# Patient Record
Sex: Male | Born: 1935 | Race: White | Hispanic: No | Marital: Married | Smoking: Former smoker
Health system: Southern US, Community
[De-identification: ages and names within clinical notes are randomized; demographics above are authoritative.]

## PROBLEM LIST (undated history)

## (undated) DIAGNOSIS — I519 Heart disease, unspecified: Secondary | ICD-10-CM

## (undated) DIAGNOSIS — C61 Malignant neoplasm of prostate: Secondary | ICD-10-CM

## (undated) DIAGNOSIS — E785 Hyperlipidemia, unspecified: Secondary | ICD-10-CM

## (undated) DIAGNOSIS — I10 Essential (primary) hypertension: Secondary | ICD-10-CM

## (undated) DIAGNOSIS — C801 Malignant (primary) neoplasm, unspecified: Secondary | ICD-10-CM

## (undated) HISTORY — PX: OTHER SURGICAL HISTORY: SHX169

## (undated) HISTORY — DX: Malignant neoplasm of prostate: C61

## (undated) HISTORY — DX: Malignant (primary) neoplasm, unspecified: C80.1

## (undated) HISTORY — DX: Heart disease, unspecified: I51.9

---
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
## 2005-01-07 HISTORY — PX: COLONOSCOPY: SHX174

## 2005-06-17 ENCOUNTER — Ambulatory Visit: Payer: Self-pay | Admitting: General Surgery

## 2008-02-15 ENCOUNTER — Emergency Department: Payer: Self-pay | Admitting: Emergency Medicine

## 2008-02-25 ENCOUNTER — Ambulatory Visit: Payer: Self-pay | Admitting: Family Medicine

## 2011-10-22 DIAGNOSIS — I2510 Atherosclerotic heart disease of native coronary artery without angina pectoris: Secondary | ICD-10-CM | POA: Insufficient documentation

## 2011-11-07 DIAGNOSIS — N529 Male erectile dysfunction, unspecified: Secondary | ICD-10-CM | POA: Insufficient documentation

## 2011-11-29 ENCOUNTER — Ambulatory Visit: Payer: Self-pay | Admitting: Otolaryngology

## 2012-01-08 DIAGNOSIS — C61 Malignant neoplasm of prostate: Secondary | ICD-10-CM

## 2012-01-08 HISTORY — DX: Malignant neoplasm of prostate: C61

## 2012-07-24 DIAGNOSIS — J309 Allergic rhinitis, unspecified: Secondary | ICD-10-CM | POA: Insufficient documentation

## 2012-11-16 ENCOUNTER — Emergency Department: Payer: Self-pay | Admitting: Emergency Medicine

## 2012-12-08 ENCOUNTER — Ambulatory Visit: Payer: Self-pay | Admitting: General Surgery

## 2012-12-22 ENCOUNTER — Ambulatory Visit: Payer: Self-pay | Admitting: General Surgery

## 2013-01-18 ENCOUNTER — Ambulatory Visit (INDEPENDENT_AMBULATORY_CARE_PROVIDER_SITE_OTHER): Payer: Medicare Other | Admitting: General Surgery

## 2013-01-18 ENCOUNTER — Encounter: Payer: Self-pay | Admitting: General Surgery

## 2013-01-18 VITALS — BP 122/64 | HR 77 | Resp 14 | Ht 67.0 in | Wt 153.0 lb

## 2013-01-18 DIAGNOSIS — Z86010 Personal history of colonic polyps: Secondary | ICD-10-CM

## 2013-01-18 DIAGNOSIS — R194 Change in bowel habit: Secondary | ICD-10-CM

## 2013-01-18 DIAGNOSIS — R198 Other specified symptoms and signs involving the digestive system and abdomen: Secondary | ICD-10-CM

## 2013-01-18 DIAGNOSIS — Z8719 Personal history of other diseases of the digestive system: Secondary | ICD-10-CM

## 2013-01-18 MED ORDER — POLYETHYLENE GLYCOL 3350 17 GM/SCOOP PO POWD: ORAL | Status: DC

## 2013-01-18 NOTE — Progress Notes (Signed)
Patient ID: Marcus Blevins, male   DOB: 01/23/1927, 80 y.o.   MRN: [id number]  Chief Complaint  Patient presents with  . Other    colonoscopy    HPI Marcus Blevins is a 80 y.o. male here for colonoscopy discussion. Patient has a history of prostate cancer, that was treated with radiation. He had colonoscopy in 2007 and a small rectal polyp was removed.  In last few mos he has noted some change in bowel habits-increase in frequency.  HPI  Past Medical History  Diagnosis Date  . Cancer     prostate  . Heart disease   . Prostate cancer 2014    8 weeks of radiation, [facility]    Past Surgical History  Procedure Laterality Date  . Colonoscopy  2007  . Cholecystectomy  1958    History reviewed. No pertinent family history.  Social History History  Substance Use Topics  . Smoking status: Former Smoker -- 66 years    Quit date: 01/07/1954  . Smokeless tobacco: Never Used  . Alcohol Use: No    No Known Allergies  Current Outpatient Prescriptions  Medication Sig Dispense Refill  . acetaminophen (TYLENOL) 500 MG tablet Take 500 mg by mouth every 6 (six) hours as needed.      Marland Kitchen amLODipine (NORVASC) 5 MG tablet Take 5 mg by mouth daily.      Marland Kitchen aspirin 325 MG tablet Take 325 mg by mouth daily.      Marland Kitchen atorvastatin (LIPITOR) 40 MG tablet Take 40 mg by mouth daily.      . cyanocobalamin 1000 MCG tablet Take 100 mcg by mouth daily.      . folic acid (FOLVITE) 676 MCG tablet Take 400 mcg by mouth daily.      Marland Kitchen guaiFENesin (MUCINEX) 600 MG 12 hr tablet Take 600 mg by mouth 2 (two) times daily as needed.      Marland Kitchen LORazepam (ATIVAN) 0.5 MG tablet Take 0.5 mg by mouth at bedtime.      . Melatonin 5 MG TABS Take 1 tablet by mouth daily.      . pseudoephedrine (SUDAFED) 30 MG tablet Take 30 mg by mouth every 4 (four) hours as needed for congestion.      Marland Kitchen pyridOXINE (VITAMIN B-6) 100 MG tablet Take 100 mg by mouth daily.      . ramipril (ALTACE) 10 MG capsule Take 10 mg by mouth daily.      .  tamsulosin (FLOMAX) 0.4 MG CAPS capsule Take 0.4 mg by mouth daily.      . traZODone (DESYREL) 100 MG tablet Take 100 mg by mouth at bedtime as needed for sleep.      . polyethylene glycol powder (GLYCOLAX/MIRALAX) powder 255 grams one bottle for colonoscopy prep  255 g  0   No current facility-administered medications for this visit.    Review of Systems Review of Systems  Constitutional: Negative.   HENT: Negative.   Respiratory: Negative.   Gastrointestinal: Positive for constipation. Negative for nausea, vomiting, abdominal pain, diarrhea, blood in stool, abdominal distention, anal bleeding and rectal pain.    Blood pressure 122/64, pulse 77, resp. rate 14, height 5\' 7"  (1.702 m), weight 153 lb (69.4 kg).  Physical Exam Physical Exam  Constitutional: He is oriented to person, place, and time. He appears well-developed and well-nourished.  Eyes: Conjunctivae are normal. No scleral icterus.  Neck: Neck supple. No thyromegaly present.  Cardiovascular: Normal rate, regular rhythm and normal heart sounds.  Pulmonary/Chest: Effort normal and breath sounds normal.  Abdominal: Soft. Normal appearance and bowel sounds are normal. There is no tenderness. No hernia.  Lymphadenopathy:    He has no cervical adenopathy.  Neurological: He is alert and oriented to person, place, and time.    Data Reviewed     Assessment    He is 8 yrs post last colonoscopy. Recent change in bowel habits, past history of a rectal polyp.     Plan    Feel it is reasonable to do colonoscopy at this time. He is agreeable.     Patient has been scheduled for a colonoscopy on 01-27-13 at [facility]. This patient has been asked to decrease current 325 mg aspirin to 81 mg aspirin starting one week prior to procedure.   [name] G 01/19/2013, 9:43 AM

## 2013-01-18 NOTE — Patient Instructions (Addendum)
Colonoscopy A colonoscopy is an exam to look at the entire large intestine (colon). This exam can help find problems such as tumors, polyps, inflammation, and areas of bleeding. The exam takes about 1 hour.  LET [facility] CARE PROVIDER KNOW ABOUT:   Any allergies you have.  All medicines you are taking, including vitamins, herbs, eye drops, creams, and over-the-counter medicines.  Previous problems you or members of your family have had with the use of anesthetics.  Any blood disorders you have.  Previous surgeries you have had.  Medical conditions you have. RISKS AND COMPLICATIONS  Generally, this is a safe procedure. However, as with any procedure, complications can occur. Possible complications include:  Bleeding.  Tearing or rupture of the colon wall.  Reaction to medicines given during the exam.  Infection (rare). BEFORE THE PROCEDURE   Ask your health care provider about changing or stopping your regular medicines.  You may be prescribed an oral bowel prep. This involves drinking a large amount of medicated liquid, starting the day before your procedure. The liquid will cause you to have multiple loose stools until your stool is almost clear or light green. This cleans out your colon in preparation for the procedure.  Do not eat or drink anything else once you have started the bowel prep, unless your health care provider tells you it is safe to do so.  Arrange for someone to drive you home after the procedure. PROCEDURE   You will be given medicine to help you relax (sedative).  You will lie on your side with your knees bent.  A long, flexible tube with a light and camera on the end (colonoscope) will be inserted through the rectum and into the colon. The camera sends video back to a computer screen as it moves through the colon. The colonoscope also releases carbon dioxide gas to inflate the colon. This helps your health care provider see the area better.  During  the exam, your health care provider may take a small tissue sample (biopsy) to be examined under a microscope if any abnormalities are found.  The exam is finished when the entire colon has been viewed. AFTER THE PROCEDURE   Do not drive for 24 hours after the exam.  You may have a small amount of blood in your stool.  You may pass moderate amounts of gas and have mild abdominal cramping or bloating. This is caused by the gas used to inflate your colon during the exam.  Ask when your test results will be ready and how you will get your results. Make sure you get your test results. Document Released: 12/22/1999 Document Revised: 10/14/2012 Document Reviewed: 08/31/2012 [facility] Patient Information 2014 [place].  Patient has been scheduled for a colonoscopy on 01-27-13 at [facility]. This patient has been asked to decrease current 325 mg aspirin to 81 mg aspirin starting one week prior to procedure.

## 2013-01-19 ENCOUNTER — Encounter: Payer: Self-pay | Admitting: General Surgery

## 2013-01-25 ENCOUNTER — Telehealth: Payer: Self-pay | Admitting: *Deleted

## 2013-01-25 NOTE — Telephone Encounter (Signed)
Patient called wanting to change colonoscopy date from 01-27-13 to 02-03-13 at [facility]. This patient states he has to leave this morning to go out of town and is requesting to have completed next week instead. He was reminded to decrease 325 mg aspirin to 81 mg aspirin starting one week prior to procedure. Patient instructed to call should he have further questions.   Trish in Endoscopy notified of date change.

## 2013-02-03 ENCOUNTER — Ambulatory Visit: Payer: Self-pay | Admitting: General Surgery

## 2013-02-03 DIAGNOSIS — R198 Other specified symptoms and signs involving the digestive system and abdomen: Secondary | ICD-10-CM

## 2013-02-03 DIAGNOSIS — K5730 Diverticulosis of large intestine without perforation or abscess without bleeding: Secondary | ICD-10-CM

## 2013-02-03 LAB — HM COLONOSCOPY

## 2013-02-04 ENCOUNTER — Encounter: Payer: Self-pay | Admitting: General Surgery

## 2014-02-22 LAB — HEMOGLOBIN A1C: Hgb A1c MFr Bld: 6.3 % — AB (ref 4.0–6.0)

## 2014-03-15 HISTORY — PX: OTHER SURGICAL HISTORY: SHX169

## 2014-03-28 ENCOUNTER — Encounter: Payer: Self-pay | Admitting: General Surgery

## 2014-03-28 ENCOUNTER — Ambulatory Visit (INDEPENDENT_AMBULATORY_CARE_PROVIDER_SITE_OTHER): Payer: Medicare Other | Admitting: General Surgery

## 2014-03-28 VITALS — BP 110/62 | HR 78 | Resp 14 | Ht 67.0 in

## 2014-03-28 DIAGNOSIS — K921 Melena: Secondary | ICD-10-CM | POA: Diagnosis not present

## 2014-03-28 NOTE — Progress Notes (Signed)
Patient ID: Marcus Blevins, male   DOB: 1927-10-04, 81 y.o.   MRN: [id number]  Chief Complaint  Patient presents with  . Other    eval blood in stool    HPI Marcus Blevins is a 81 y.o. male here today for a evaluation of blood in stool. He first noticed this today. He had a bowel movement and had some blood on the tissue paper. Later on he had an urge to have a bowel movement and had some incontinence. About 30 minutes after that he had some watery stool with blood, and then had another small movement with some blood in it. He describes this as a pink red in color. His most recent colonoscopy was done on 02/03/13 with diverticula in the sigmoid colon found.  HPI  Past Medical History  Diagnosis Date  . Cancer     prostate  . Heart disease   . Prostate cancer 2014    8 weeks of radiation, [facility]    Past Surgical History  Procedure Laterality Date  . Colonoscopy  2007  . Cholecystectomy  1958  . Bladder scraping  03-15-14    [facility], for bladder cancer    History reviewed. No pertinent family history.  Social History History  Substance Use Topics  . Smoking status: Former Smoker -- 96 years    Quit date: 01/07/1954  . Smokeless tobacco: Never Used  . Alcohol Use: No    No Known Allergies  Current Outpatient Prescriptions  Medication Sig Dispense Refill  . acetaminophen (TYLENOL) 500 MG tablet Take 500 mg by mouth every 6 (six) hours as needed.    Marland Kitchen amLODipine (NORVASC) 5 MG tablet Take 5 mg by mouth daily.    Marland Kitchen aspirin 81 MG tablet Take 81 mg by mouth daily.    Marland Kitchen atorvastatin (LIPITOR) 40 MG tablet Take 40 mg by mouth daily.    . cyanocobalamin 1000 MCG tablet Take 100 mcg by mouth daily.    . folic acid (FOLVITE) 631 MCG tablet Take 400 mcg by mouth daily.    Marland Kitchen guaiFENesin (MUCINEX) 600 MG 12 hr tablet Take 600 mg by mouth 2 (two) times daily as needed.    . Melatonin 5 MG TABS Take 1 tablet by mouth daily.    Marland Kitchen pyridOXINE (VITAMIN B-6) 100 MG tablet Take 100 mg by mouth  daily.    . tamsulosin (FLOMAX) 0.4 MG CAPS capsule Take 0.4 mg by mouth daily.     No current facility-administered medications for this visit.    Review of Systems Review of Systems  Constitutional: Negative.   Respiratory: Negative.   Cardiovascular: Negative.   Gastrointestinal: Positive for constipation and blood in stool. Negative for nausea, vomiting, abdominal pain, diarrhea, abdominal distention, anal bleeding and rectal pain.    Blood pressure 110/62, pulse 78, resp. rate 14, height 5\' 7"  (1.702 m).  Physical Exam Physical Exam  Constitutional: He is oriented to person, place, and time. He appears well-developed and well-nourished.  Abdominal: Soft. Normal appearance. There is no tenderness.  Genitourinary: Rectal exam shows no external hemorrhoid.  No blood or stool in the rectum. No visible or palpable findings in the anorectal area.  Neurological: He is alert and oriented to person, place, and time.  Skin: Skin is warm and dry.    Data Reviewed Prior notes  Assessment    Posible mild bleed from diverticulosis. Regardless he is not having any more bleeding.     Plan   Advised on above.  If bleeding recurs to call. Patient to return as needed.       Marcus Blevins 03/30/2014, 9:39 AM

## 2014-03-28 NOTE — Patient Instructions (Signed)
Low-Fiber Diet °Fiber is found in fruits, vegetables, and whole grains. A low-fiber diet restricts fibrous foods that are not digested in the small intestine. A diet containing about 10-15 grams of fiber per day is considered low fiber. Low-fiber diets may be used to: °· Promote healing and rest the bowel during intestinal flare-ups. °· Prevent blockage of a partially obstructed or narrowed gastrointestinal tract. °· Reduce fecal weight and volume. °· Slow the movement of feces. °You may be on a low-fiber diet as a transitional diet following surgery, after an injury (trauma), or because of a short (acute) or lifelong (chronic) illness. Your health care provider will determine the length of time you need to stay on this diet.  °WHAT DO I NEED TO KNOW ABOUT A LOW-FIBER DIET? °Always check the fiber content on the packaging's Nutrition Facts label, especially on foods from the grains list. Ask your dietitian if you have questions about specific foods that are related to your condition, especially if the food is not listed below. In general, a low-fiber food will have less than 2 g of fiber. °WHAT FOODS CAN I EAT? °Grains °All breads and crackers made with white flour. Sweet rolls, doughnuts, waffles, pancakes, French toast, bagels. Pretzels, Melba toast, zwieback. Well-cooked cereals, such as cornmeal, farina, or cream cereals. Dry cereals that do not contain whole grains, fruit, or nuts, such as refined corn, wheat, rice, and oat cereals. Potatoes prepared any way without skins, plain pastas and noodles, refined white rice. Use white flour for baking and making sauces. Use allowed list of grains for casseroles, dumplings, and puddings.  °Vegetables °Strained tomato and vegetable juices. Fresh lettuce, cucumber, spinach. Well-cooked (no skin or pulp) or canned vegetables, such as asparagus, bean sprouts, beets, carrots, green beans, mushrooms, potatoes, pumpkin, spinach, yellow squash, tomato sauce/puree, turnips,  yams, and zucchini. Keep servings limited to ½ cup.  °Fruits °All fruit juices except prune juice. Cooked or canned fruits without skin and seeds, such as applesauce, apricots, cherries, fruit cocktail, grapefruit, grapes, mandarin oranges, melons, peaches, pears, pineapple, and plums. Fresh fruits without skin, such as apricots, avocados, bananas, melons, pineapple, nectarines, and peaches. Keep servings limited to ½ cup or 1 piece.   °Meat and Other Protein Sources °Ground or well-cooked tender beef, ham, veal, lamb, pork, or poultry. Eggs, plain cheese. Fish, oysters, shrimp, lobster, and other seafood. Liver, organ meats. Smooth nut butters. °Dairy °All milk products and alternative dairy substitutes, such as soy, rice, almond, and coconut, not containing added whole nuts, seeds, or added fruit. °Beverages °Decaf coffee, fruit, and vegetable juices or smoothies (small amounts, with no pulp or skins, and with fruits from allowed list), sports drinks, herbal tea. °Condiments °Ketchup, mustard, vinegar, cream sauce, cheese sauce, cocoa powder. Spices in moderation, such as allspice, basil, bay leaves, celery powder or leaves, cinnamon, cumin powder, curry powder, ginger, mace, marjoram, onion or garlic powder, oregano, paprika, parsley flakes, ground pepper, rosemary, sage, savory, tarragon, thyme, and turmeric. °Sweets and Desserts °Plain cakes and cookies, pie made with allowed fruit, pudding, custard, cream pie. Gelatin, fruit, ice, sherbet, frozen ice pops. Ice cream, ice milk without nuts. Plain hard candy, honey, jelly, molasses, syrup, sugar, chocolate syrup, gumdrops, marshmallows. Limit overall sugar intake.  °Fats and Oil °Margarine, butter, cream, mayonnaise, salad oils, plain salad dressings made from allowed foods. Choose healthy fats such as olive oil, canola oil, and omega-3 fatty acids (such as found in salmon or tuna) when possible.  °Other °Bouillon, broth, or cream soups made   from allowed foods.  Any strained soup. Casseroles or mixed dishes made with allowed foods. °The items listed above may not be a complete list of recommended foods or beverages. Contact your dietitian for more options.  °WHAT FOODS ARE NOT RECOMMENDED? °Grains °All whole wheat and whole grain breads and crackers. Multigrains, rye, bran seeds, nuts, or coconut. Cereals containing whole grains, multigrains, bran, coconut, nuts, raisins. Cooked or dry oatmeal, steel-cut oats. Coarse wheat cereals, granola. Cereals advertised as high fiber. Potato skins. Whole grain pasta, wild or brown rice. Popcorn. Coconut flour. Bran, buckwheat, corn bread, multigrains, rye, wheat germ.  °Vegetables °Fresh, cooked or canned vegetables, such as artichokes, asparagus, beet greens, broccoli, Brussels sprouts, cabbage, celery, cauliflower, corn, eggplant, kale, legumes or beans, okra, peas, and tomatoes. Avoid large servings of any vegetables, especially raw vegetables.  °Fruits °Fresh fruits, such as apples with or without skin, berries, cherries, figs, grapes, grapefruit, guavas, kiwis, mangoes, oranges, papayas, pears, persimmons, pineapple, and pomegranate. Prune juice and juices with pulp, stewed or dried prunes. Dried fruits, dates, raisins. Fruit seeds or skins. Avoid large servings of all fresh fruits. °Meats and Other Protein Sources °Tough, fibrous meats with gristle. Chunky nut butter. Cheese made with seeds, nuts, or other foods not recommended. Nuts, seeds, legumes (beans, including baked beans), dried peas, beans, lentils.  °Dairy °Yogurt or cheese that contains nuts, seeds, or added fruit.  °Beverages °Fruit juices with high pulp, prune juice. Caffeinated coffee and teas.  °Condiments °Coconut, maple syrup, pickles, olives. °Sweets and Desserts °Desserts, cookies, or candies that contain nuts or coconut, chunky peanut butter, dried fruits. Jams, preserves with seeds, marmalade. Large amounts of sugar and sweets. Any other dessert made with  fruits from the not recommended list.  °Other °Soups made from vegetables that are not recommended or that contain other foods not recommended.  °The items listed above may not be a complete list of foods and beverages to avoid. Contact your dietitian for more information. °Document Released: 06/15/2001 Document Revised: 12/29/2012 Document Reviewed: 11/16/2012 °[employer]® Patient Information ©2015 [employer]. This information is not intended to replace advice given to you by your health care provider. Make sure you discuss any questions you have with your health care provider. ° °

## 2014-03-30 ENCOUNTER — Encounter: Payer: Self-pay | Admitting: General Surgery

## 2014-04-01 DIAGNOSIS — C673 Malignant neoplasm of anterior wall of bladder: Secondary | ICD-10-CM | POA: Insufficient documentation

## 2014-04-21 DIAGNOSIS — C7951 Secondary malignant neoplasm of bone: Secondary | ICD-10-CM | POA: Insufficient documentation

## 2014-06-11 ENCOUNTER — Other Ambulatory Visit: Payer: Self-pay | Admitting: Family Medicine

## 2014-07-06 ENCOUNTER — Observation Stay
Admission: EM | Admit: 2014-07-06 | Discharge: 2014-07-08 | Disposition: A | Discharge status: Home / Self Care | Payer: Medicare Other | Attending: Internal Medicine | Admitting: Internal Medicine

## 2014-07-06 ENCOUNTER — Encounter: Payer: Self-pay | Admitting: Emergency Medicine

## 2014-07-06 ENCOUNTER — Emergency Department: Payer: Medicare Other

## 2014-07-06 DIAGNOSIS — Z79899 Other long term (current) drug therapy: Secondary | ICD-10-CM | POA: Insufficient documentation

## 2014-07-06 DIAGNOSIS — K121 Other forms of stomatitis: Secondary | ICD-10-CM | POA: Insufficient documentation

## 2014-07-06 DIAGNOSIS — C61 Malignant neoplasm of prostate: Secondary | ICD-10-CM | POA: Diagnosis not present

## 2014-07-06 DIAGNOSIS — B9620 Unspecified Escherichia coli [E. coli] as the cause of diseases classified elsewhere: Secondary | ICD-10-CM | POA: Diagnosis not present

## 2014-07-06 DIAGNOSIS — Z823 Family history of stroke: Secondary | ICD-10-CM | POA: Diagnosis not present

## 2014-07-06 DIAGNOSIS — Z87891 Personal history of nicotine dependence: Secondary | ICD-10-CM | POA: Insufficient documentation

## 2014-07-06 DIAGNOSIS — Z7982 Long term (current) use of aspirin: Secondary | ICD-10-CM | POA: Diagnosis not present

## 2014-07-06 DIAGNOSIS — I10 Essential (primary) hypertension: Secondary | ICD-10-CM | POA: Diagnosis not present

## 2014-07-06 DIAGNOSIS — Z8551 Personal history of malignant neoplasm of bladder: Secondary | ICD-10-CM | POA: Diagnosis not present

## 2014-07-06 DIAGNOSIS — R5081 Fever presenting with conditions classified elsewhere: Secondary | ICD-10-CM | POA: Diagnosis not present

## 2014-07-06 DIAGNOSIS — R05 Cough: Secondary | ICD-10-CM | POA: Diagnosis not present

## 2014-07-06 DIAGNOSIS — N390 Urinary tract infection, site not specified: Secondary | ICD-10-CM | POA: Diagnosis not present

## 2014-07-06 DIAGNOSIS — Z9049 Acquired absence of other specified parts of digestive tract: Secondary | ICD-10-CM | POA: Insufficient documentation

## 2014-07-06 DIAGNOSIS — Z1639 Resistance to other specified antimicrobial drug: Secondary | ICD-10-CM | POA: Diagnosis not present

## 2014-07-06 DIAGNOSIS — E785 Hyperlipidemia, unspecified: Secondary | ICD-10-CM | POA: Insufficient documentation

## 2014-07-06 DIAGNOSIS — Z9221 Personal history of antineoplastic chemotherapy: Secondary | ICD-10-CM | POA: Insufficient documentation

## 2014-07-06 DIAGNOSIS — D709 Neutropenia, unspecified: Secondary | ICD-10-CM | POA: Diagnosis present

## 2014-07-06 DIAGNOSIS — Z8249 Family history of ischemic heart disease and other diseases of the circulatory system: Secondary | ICD-10-CM | POA: Diagnosis not present

## 2014-07-06 DIAGNOSIS — R32 Unspecified urinary incontinence: Secondary | ICD-10-CM | POA: Insufficient documentation

## 2014-07-06 DIAGNOSIS — Z1611 Resistance to penicillins: Secondary | ICD-10-CM | POA: Insufficient documentation

## 2014-07-06 HISTORY — DX: Hyperlipidemia, unspecified: E78.5

## 2014-07-06 HISTORY — DX: Essential (primary) hypertension: I10

## 2014-07-06 LAB — CBC WITH DIFFERENTIAL/PLATELET
Band Neutrophils: 0 % (ref 0–10)
Basophils Absolute: 0 10*3/uL (ref 0.0–0.1)
Basophils Relative: 0 % (ref 0–1)
Blasts: 0 %
Eosinophils Absolute: 0 10*3/uL (ref 0.0–0.7)
Eosinophils Relative: 0 % (ref 0–5)
HCT: 32.2 % — ABNORMAL LOW (ref 40.0–52.0)
HEMOGLOBIN: 10.6 g/dL — AB (ref 13.0–18.0)
LYMPHS ABS: 0.5 10*3/uL — AB (ref 0.7–4.0)
Lymphocytes Relative: 96 % — ABNORMAL HIGH (ref 12–46)
MCH: 26.4 pg (ref 26.0–34.0)
MCHC: 33.1 g/dL (ref 32.0–36.0)
MCV: 79.7 fL — ABNORMAL LOW (ref 80.0–100.0)
METAMYELOCYTES PCT: 0 %
MONOS PCT: 4 % (ref 3–12)
Monocytes Absolute: 0 10*3/uL — ABNORMAL LOW (ref 0.1–1.0)
Myelocytes: 0 %
NEUTROS PCT: 0 % — AB (ref 43–77)
NRBC: 0 /100{WBCs}
Neutro Abs: 0 10*3/uL — ABNORMAL LOW (ref 1.7–7.7)
OTHER: 0 %
Platelets: 167 10*3/uL (ref 150–440)
Promyelocytes Absolute: 0 %
RBC: 4.03 MIL/uL — ABNORMAL LOW (ref 4.40–5.90)
RDW: 15.7 % — ABNORMAL HIGH (ref 11.5–14.5)
WBC: 0.5 10*3/uL — CL (ref 3.8–10.6)

## 2014-07-06 LAB — COMPREHENSIVE METABOLIC PANEL
ALK PHOS: 97 U/L (ref 38–126)
ALT: 16 U/L — AB (ref 17–63)
ANION GAP: 7 (ref 5–15)
AST: 18 U/L (ref 15–41)
Albumin: 3.3 g/dL — ABNORMAL LOW (ref 3.5–5.0)
BUN: 15 mg/dL (ref 6–20)
CALCIUM: 7.7 mg/dL — AB (ref 8.9–10.3)
CO2: 25 mmol/L (ref 22–32)
Chloride: 103 mmol/L (ref 101–111)
Creatinine, Ser: 0.95 mg/dL (ref 0.61–1.24)
GFR calc Af Amer: >60 mL/min (ref >60)
GFR calc non Af Amer: >60 mL/min (ref >60)
GLUCOSE: 186 mg/dL — AB (ref 65–99)
Potassium: 4.1 mmol/L (ref 3.5–5.1)
Sodium: 135 mmol/L (ref 135–145)
TOTAL PROTEIN: 6.7 g/dL (ref 6.5–8.1)
Total Bilirubin: 1 mg/dL (ref 0.3–1.2)

## 2014-07-06 LAB — TROPONIN I

## 2014-07-06 MED ORDER — DEXTROSE 5 % IV SOLN
2.0000 g | Freq: Three times a day (TID) | INTRAVENOUS | Status: DC
  Administered 2014-07-07 (×4): 2 g via INTRAVENOUS
  Filled 2014-07-06 (×7): qty 2

## 2014-07-06 MED ORDER — BUDESONIDE 0.5 MG/2ML IN SUSP
0.5000 mg | Freq: Two times a day (BID) | RESPIRATORY_TRACT | Status: DC
  Administered 2014-07-07: 0.5 mg via RESPIRATORY_TRACT
  Filled 2014-07-06: qty 2

## 2014-07-06 MED ORDER — VITAMIN B-12 1000 MCG PO TABS
1000.0000 ug | ORAL_TABLET | Freq: Every day | ORAL | Status: DC
  Administered 2014-07-07 – 2014-07-08 (×2): 1000 ug via ORAL
  Filled 2014-07-06 (×3): qty 1

## 2014-07-06 MED ORDER — ATORVASTATIN CALCIUM 20 MG PO TABS
20.0000 mg | ORAL_TABLET | Freq: Every day | ORAL | Status: DC
  Administered 2014-07-07: 20 mg via ORAL
  Filled 2014-07-06 (×2): qty 1

## 2014-07-06 MED ORDER — ONDANSETRON HCL 4 MG PO TABS
4.0000 mg | ORAL_TABLET | Freq: Four times a day (QID) | ORAL | Status: DC | PRN
Start: 2014-07-06 — End: 2014-07-08
  Administered 2014-07-06 – 2014-07-07 (×2): 4 mg via ORAL
  Filled 2014-07-06 (×2): qty 1

## 2014-07-06 MED ORDER — FLUTICASONE PROPIONATE 50 MCG/ACT NA SUSP
1.0000 | Freq: Every day | NASAL | Status: DC
  Administered 2014-07-07 – 2014-07-08 (×3): 1 via NASAL
  Filled 2014-07-06 (×2): qty 16

## 2014-07-06 MED ORDER — VITAMIN B-6 100 MG PO TABS
100.0000 mg | ORAL_TABLET | Freq: Every day | ORAL | Status: DC
  Administered 2014-07-07 – 2014-07-08 (×2): 100 mg via ORAL
  Filled 2014-07-06 (×5): qty 1

## 2014-07-06 MED ORDER — AMLODIPINE BESYLATE 5 MG PO TABS
5.0000 mg | ORAL_TABLET | Freq: Every day | ORAL | Status: DC
  Administered 2014-07-07 – 2014-07-08 (×2): 5 mg via ORAL
  Filled 2014-07-06 (×3): qty 1

## 2014-07-06 MED ORDER — FOLIC ACID 1 MG PO TABS
1000.0000 ug | ORAL_TABLET | Freq: Every day | ORAL | Status: DC
  Administered 2014-07-07 – 2014-07-08 (×2): 1 mg via ORAL
  Filled 2014-07-06 (×4): qty 1

## 2014-07-06 MED ORDER — ACETAMINOPHEN 650 MG RE SUPP: 650.0000 mg | Freq: Four times a day (QID) | RECTAL | Status: DC | PRN

## 2014-07-06 MED ORDER — VANCOMYCIN HCL IN DEXTROSE 1-5 GM/200ML-% IV SOLN
1000.0000 mg | Freq: Once | INTRAVENOUS | Status: AC
Start: 2014-07-06 — End: 2014-07-07
  Administered 2014-07-06: 1000 mg via INTRAVENOUS
  Filled 2014-07-06: qty 200

## 2014-07-06 MED ORDER — ONDANSETRON HCL 4 MG/2ML IJ SOLN
4.0000 mg | Freq: Four times a day (QID) | INTRAMUSCULAR | Status: DC | PRN
  Administered 2014-07-07: 11:00:00 4 mg via INTRAVENOUS
  Filled 2014-07-06: qty 2

## 2014-07-06 MED ORDER — PROCHLORPERAZINE MALEATE 10 MG PO TABS: 10.0000 mg | ORAL_TABLET | Freq: Four times a day (QID) | ORAL | Status: DC | PRN

## 2014-07-06 MED ORDER — MONTELUKAST SODIUM 10 MG PO TABS
10.0000 mg | ORAL_TABLET | Freq: Every day | ORAL | Status: DC
  Administered 2014-07-07 – 2014-07-08 (×2): 10 mg via ORAL
  Filled 2014-07-06 (×3): qty 1

## 2014-07-06 MED ORDER — NYSTATIN 100000 UNIT/ML MT SUSP
5.0000 mL | Freq: Four times a day (QID) | OROMUCOSAL | Status: DC
  Administered 2014-07-06 – 2014-07-08 (×5): 500000 [IU] via OROMUCOSAL
  Filled 2014-07-06 (×7): qty 5

## 2014-07-06 MED ORDER — ASPIRIN EC 81 MG PO TBEC
81.0000 mg | DELAYED_RELEASE_TABLET | Freq: Every day | ORAL | Status: DC
  Administered 2014-07-07 – 2014-07-08 (×2): 81 mg via ORAL
  Filled 2014-07-06 (×3): qty 1

## 2014-07-06 MED ORDER — DIPHENHYDRAMINE HCL 25 MG PO CAPS
25.0000 mg | ORAL_CAPSULE | Freq: Every evening | ORAL | Status: DC | PRN
  Administered 2014-07-06 – 2014-07-07 (×2): 25 mg via ORAL
  Filled 2014-07-06 (×2): qty 1

## 2014-07-06 MED ORDER — TAMSULOSIN HCL 0.4 MG PO CAPS
0.4000 mg | ORAL_CAPSULE | Freq: Every day | ORAL | Status: DC
  Administered 2014-07-07 – 2014-07-08 (×2): 0.4 mg via ORAL
  Filled 2014-07-06 (×3): qty 1

## 2014-07-06 MED ORDER — ACETAMINOPHEN 325 MG PO TABS
650.0000 mg | ORAL_TABLET | Freq: Four times a day (QID) | ORAL | Status: DC | PRN
  Administered 2014-07-06 – 2014-07-07 (×2): 650 mg via ORAL
  Filled 2014-07-06 (×2): qty 2

## 2014-07-06 MED ORDER — ENOXAPARIN SODIUM 40 MG/0.4ML ~~LOC~~ SOLN
40.0000 mg | SUBCUTANEOUS | Status: DC
  Administered 2014-07-06 – 2014-07-07 (×2): 40 mg via SUBCUTANEOUS
  Filled 2014-07-06 (×2): qty 0.4

## 2014-07-06 MED ORDER — FILGRASTIM 300 MCG/ML IJ SOLN
300.0000 ug | Freq: Once | INTRAMUSCULAR | Status: AC
  Administered 2014-07-06: 300 ug via SUBCUTANEOUS
  Filled 2014-07-06: qty 1

## 2014-07-06 MED ORDER — DARIFENACIN HYDROBROMIDE ER 7.5 MG PO TB24
7.5000 mg | ORAL_TABLET | Freq: Every day | ORAL | Status: DC
  Administered 2014-07-07 – 2014-07-08 (×2): 7.5 mg via ORAL
  Filled 2014-07-06 (×2): qty 1

## 2014-07-06 MED ORDER — SODIUM CHLORIDE 0.9 % IV SOLN
500.0000 mg | Freq: Once | INTRAVENOUS | Status: AC
  Administered 2014-07-06: 500 mg via INTRAVENOUS
  Filled 2014-07-06: qty 500

## 2014-07-06 MED ORDER — ATENOLOL 50 MG PO TABS
50.0000 mg | ORAL_TABLET | Freq: Every day | ORAL | Status: DC
  Administered 2014-07-07 – 2014-07-08 (×2): 50 mg via ORAL
  Filled 2014-07-06 (×3): qty 1

## 2014-07-06 MED ORDER — CLOTRIMAZOLE 1 % EX CREA
TOPICAL_CREAM | Freq: Two times a day (BID) | CUTANEOUS | Status: DC
  Filled 2014-07-06: qty 15

## 2014-07-06 MED ORDER — LIDOCAINE VISCOUS 2 % MT SOLN
5.0000 mL | OROMUCOSAL | Status: DC | PRN
  Administered 2014-07-06: 22:00:00 5 mL via OROMUCOSAL
  Filled 2014-07-06: qty 15

## 2014-07-06 NOTE — ED Notes (Signed)
Pt here with a fever. Pt recently stated on chemo and is running a fever. Pt was told that he needed to come in for eval.  Pt also states that he has had an increase in generalized pain. Pt was placed on the monitor and pt in NAD at this time. Pts family at bedside

## 2014-07-06 NOTE — ED Provider Notes (Signed)
[facility] Emergency Department Provider Note  ____________________________________________  Time seen: Approximately 4:49 PM  I have reviewed the triage vital signs and the nursing notes.   HISTORY  Chief Complaint Fever    HPI Marcus Blevins is a 81 y.o. male Patient comes in with family who report he has been taking docetaxel DOC ETA XEL for prostate cancer. [facility] is been giving it to him. He got his first dose 1 week ago today. He has been having various aches and pains in his ankles his groin his jaw he has discussed these with his doctor and been told that they are side effects of that medicine. Today patient began running a fever of almost 101. He has no new symptoms he continues to have the various and sundry aches and pains. He has sores in his mouth that are painful. These have been present for several days. He is seeing his dentist about them and received [facility] mouthwash for them. He reports [facility] mouthwash as helped but he still having trouble with it and does not think his upper dentures are fitting exactly right. Patient has a chronic dry cough. Patient has no other new symptoms.   Past Medical History  Diagnosis Date  . Cancer     prostate  . Heart disease   . Prostate cancer 2014    8 weeks of radiation, [facility]  . Hypertension   . Hyperlipidemia     There are no active problems to display for this patient.   Past Surgical History  Procedure Laterality Date  . Colonoscopy  2007  . Cholecystectomy  1958  . Bladder scraping  03-15-14    [facility], for bladder cancer  . Tumor removed from bladder      Current Outpatient Rx  Name  Route  Sig  Dispense  Refill  . amLODipine (NORVASC) 5 MG tablet   Oral   Take 5 mg by mouth daily.         Marland Kitchen aspirin EC 81 MG tablet   Oral   Take 81 mg by mouth daily.         Marland Kitchen atenolol (TENORMIN) 50 MG tablet   Oral   Take 50 mg by mouth daily.         Marland Kitchen atorvastatin (LIPITOR) 20 MG  tablet   Oral   Take 20 mg by mouth at bedtime.         . clotrimazole-betamethasone (LOTRISONE) cream   Topical   Apply 1 application topically 2 (two) times daily.         Marland Kitchen dexamethasone (DECADRON) 4 MG tablet   Oral   Take 8 mg by mouth 2 (two) times daily. Pt only takes the day before and day after  DOCEtaxel.         . diphenhydramine-acetaminophen (TYLENOL PM) 25-500 MG TABS   Oral   Take 1 tablet by mouth at bedtime as needed (for sleep).         . fluticasone (FLONASE) 50 MCG/ACT nasal spray   Each Nare   Place 1 spray into both nostrils daily.         . folic acid (FOLVITE) 250 MCG tablet   Oral   Take 400 mcg by mouth daily.         Marland Kitchen leuprolide, 6 Month, (ELIGARD) 45 MG injection   Subcutaneous   Inject 45 mg into the skin every 6 (six) months.         Marland Kitchen  lidocaine (XYLOCAINE) 2 % solution   Oral   Take 5 mLs by mouth 5 (five) times daily as needed for mouth pain.         . Melatonin 5 MG TABS   Oral   Take 1 tablet by mouth at bedtime as needed (for sleep).          . mometasone (ASMANEX) 220 MCG/INH inhaler   Inhalation   Inhale 1 puff into the lungs 2 (two) times daily.         . montelukast (SINGULAIR) 10 MG tablet   Oral   Take 10 mg by mouth daily.         . prochlorperazine (COMPAZINE) 10 MG tablet   Oral   Take 10 mg by mouth every 6 (six) hours as needed for nausea or vomiting.         . pyridOXINE (VITAMIN B-6) 100 MG tablet   Oral   Take 100 mg by mouth daily.         . solifenacin (VESICARE) 10 MG tablet   Oral   Take 10 mg by mouth at bedtime.         . tamsulosin (FLOMAX) 0.4 MG CAPS capsule   Oral   Take 0.4 mg by mouth daily.         . vitamin B-12 (CYANOCOBALAMIN) 1000 MCG tablet   Oral   Take 1,000 mcg by mouth daily.         . montelukast (SINGULAIR) 10 MG tablet      TAKE 1 TABLET BY MOUTH DAILY AS NEEDED FOR SINUS DRAINAGE Patient not taking: Reported on 07/06/2014   30 tablet   3      Allergies Review of patient's allergies indicates no known allergies.  No family history on file.  Social History History  Substance Use Topics  . Smoking status: Former Smoker -- 5 years    Quit date: 01/07/1954  . Smokeless tobacco: Never Used  . Alcohol Use: No    Review of Systems Constitutional: No fever/chills Eyes: No visual changes. ENT: No sore throat. Cardiovascular: Denies chest pain. Respiratory: Denies shortness of breath. Gastrointestinal: No abdominal pain.  No nausea, no vomiting.  No diarrhea.  No constipation. Genitourinary: Negative for dysuria. Musculoskeletal: Negative for back pain. Skin: Negative for rash. Neurological: Negative for headaches, focal weakness or numbness. Hematological/Lymphatic: Allergic/Immunilogical: **} 10-point ROS otherwise negative.  ____________________________________________   PHYSICAL EXAM:  VITAL SIGNS: ED Triage Vitals  Enc Vitals Group     BP 07/06/14 1555 121/60 mmHg     Pulse Rate 07/06/14 1555 91     Resp 07/06/14 1555 18     Temp 07/06/14 1555 97.8 F (36.6 C)     Temp Source 07/06/14 1555 Oral     SpO2 07/06/14 1555 96 %     Weight 07/06/14 1555 150 lb (68.04 kg)     Height 07/06/14 1555 5\' 7"  (1.702 m)     Head Cir --      Peak Flow --      Pain Score 07/06/14 1559 4     Pain Loc --      Pain Edu? --      Excl. in [place]? --     Constitutional: Alert and oriented. Well appearing and in no acute distress. Eyes: Conjunctivae are normal. PERRL. EOMI. Head: Atraumatic.there is no tenderness in the temples. Ears TMs obscured by wax Nose: No congestion/rhinnorhea. Mouth/Throat: Mucous membranes are moist.  Oropharynx edematous with  some white discharge no obvious ulcers Neck: No stridor.  No adenopathy Cardiovascular: Normal rate, regular rhythm. Grossly normal heart sounds.  Good peripheral circulation. Respiratory: Normal respiratory effort.  No retractions. Lungs CTAB. Gastrointestinal: Soft and  nontender. No distention. No abdominal bruits. No CVA tenderness. Musculoskeletal: No lower extremity tenderness nor edema.  No joint effusions. Neurologic:  Normal speech and language. No gross focal neurologic deficits are appreciated. Speech is normal. No gait instability. Skin:  Skin is warm, dry and intact. No rash noted. Psychiatric: Mood and affect are normal. Speech and behavior are normal.  ____________________________________________   LABS (all labs ordered are listed, but only abnormal results are displayed)  Labs Reviewed  CBC WITH DIFFERENTIAL/PLATELET - Abnormal; Notable for the following:    WBC 0.5 (*)    RBC 4.03 (*)    Hemoglobin 10.6 (*)    HCT 32.2 (*)    MCV 79.7 (*)    RDW 15.7 (*)    Neutrophils Relative % 0 (*)    Lymphocytes Relative 96 (*)    Neutro Abs 0.0 (*)    Lymphs Abs 0.5 (*)    Monocytes Absolute 0.0 (*)    All other components within normal limits  COMPREHENSIVE METABOLIC PANEL - Abnormal; Notable for the following:    Glucose, Bld 186 (*)    Calcium 7.7 (*)    Albumin 3.3 (*)    ALT 16 (*)    All other components within normal limits  CULTURE, BLOOD (ROUTINE X 2)  CULTURE, BLOOD (ROUTINE X 2)  URINE CULTURE  TROPONIN I   ____________________________________________  EKG   ____________________________________________  RADIOLOGY  Chest x-ray no acute changes reviewed by me ____________________________________________   PROCEDURES    ____________________________________________   INITIAL IMPRESSION / ASSESSMENT AND PLAN / ED COURSE  Pertinent labs & imaging results that were available during my care of the patient were reviewed by me and considered in my medical decision making (see chart for details).  Patient discussed discussed with Dr. Ernestene Mention ANG his oncologist at [facility] he recommends cefepime for neutropenic fever we unfortunately do not have that available here apparently is on back order iron treated him with Primaxin Dr.  Liberty Handy he will follow-up with the admission ____________________________________________   FINAL CLINICAL IMPRESSION(S) / ED DIAGNOSES  Final diagnoses:  Neutropenic fever      Nena Polio, MD 07/06/14 [phone]

## 2014-07-06 NOTE — H&P (Signed)
[place] at [place] NAME: Marcus Blevins    MR#:  [id number]  DATE OF BIRTH:  1927/04/16  DATE OF ADMISSION:  07/06/2014  PRIMARY CARE PHYSICIAN: Lelon Huh, MD   REQUESTING/REFERRING PHYSICIAN: Dr. Corinna Capra  CHIEF COMPLAINT:   Chief Complaint  Patient presents with  . Fever   neutropenic fever  HISTORY OF PRESENT ILLNESS:  Marcus Blevins  is a 81 y.o. male with a known history of prostate cancer currently undergoing chemotherapy, hypertension, urinary incontinence, hypertension, hyperlipidemia, came into the hospital due to fever 100.9. Patient was recently started on chemotherapy for his cancer about a week ago and is followed by [facility]. She was noted to have a white cell count of 0.5 and noted to be neutropenic and therefore is being admitted for further evaluation of his neutropenic fever. The ER physician spoke with the patient's oncologist at [facility] and he recommended admission for observation. Patient's only complaint is soreness in his mouth and poor appetite. He denies any shortness of breath, cough, chest pain, and dysuria, hematuria or any other associated symptoms presently.  PAST MEDICAL HISTORY:   Past Medical History  Diagnosis Date  . Cancer     prostate  . Heart disease   . Prostate cancer 2014    8 weeks of radiation, [facility]  . Hypertension   . Hyperlipidemia     PAST SURGICAL HISTORY:   Past Surgical History  Procedure Laterality Date  . Colonoscopy  2007  . Cholecystectomy  1958  . Bladder scraping  03-15-14    [facility], for bladder cancer  . Tumor removed from bladder      SOCIAL HISTORY:   History  Substance Use Topics  . Smoking status: Former Smoker -- 1.00 packs/day for 20 years    Quit date: 01/07/1954  . Smokeless tobacco: Never Used  . Alcohol Use: No    FAMILY HISTORY:   Family History  Problem Relation Age of Onset  . CVA Mother   . Heart attack Father     DRUG  ALLERGIES:  No Known Allergies  REVIEW OF SYSTEMS:   Review of Systems  Constitutional: Positive for fever (100.9). Negative for weight loss.  HENT: Positive for sore throat. Negative for congestion, nosebleeds and tinnitus.        Mouth sores, pain with swallowing.   Eyes: Negative for blurred vision, double vision and redness.  Respiratory: Negative for cough, hemoptysis and shortness of breath.   Cardiovascular: Negative for chest pain, orthopnea, leg swelling and PND.  Gastrointestinal: Negative for nausea, vomiting, abdominal pain, diarrhea and melena.  Genitourinary: Negative for dysuria, urgency and hematuria.  Musculoskeletal: Negative for joint pain and falls.  Skin: Negative for rash.  Neurological: Negative for dizziness, tingling, sensory change, focal weakness, seizures, weakness and headaches.  Endo/Heme/Allergies: Negative for polydipsia. Does not bruise/bleed easily.  Psychiatric/Behavioral: Negative for depression and memory loss. The patient is not nervous/anxious.     MEDICATIONS AT HOME:   Prior to Admission medications   Medication Sig Start Date End Date Taking? Authorizing Provider  amLODipine (NORVASC) 5 MG tablet Take 5 mg by mouth daily.   Yes Historical Provider, MD  aspirin EC 81 MG tablet Take 81 mg by mouth daily.   Yes Historical Provider, MD  atenolol (TENORMIN) 50 MG tablet Take 50 mg by mouth daily.   Yes Historical Provider, MD  atorvastatin (LIPITOR) 20 MG tablet Take 20 mg by mouth at bedtime.  Yes Historical Provider, MD  clotrimazole-betamethasone (LOTRISONE) cream Apply 1 application topically 2 (two) times daily.   Yes Historical Provider, MD  dexamethasone (DECADRON) 4 MG tablet Take 8 mg by mouth 2 (two) times daily. Pt only takes the day before and day after  DOCEtaxel.   Yes Historical Provider, MD  diphenhydramine-acetaminophen (TYLENOL PM) 25-500 MG TABS Take 1 tablet by mouth at bedtime as needed (for sleep).   Yes Historical Provider,  MD  fluticasone (FLONASE) 50 MCG/ACT nasal spray Place 1 spray into both nostrils daily.   Yes Historical Provider, MD  folic acid (FOLVITE) 341 MCG tablet Take 400 mcg by mouth daily.   Yes Historical Provider, MD  leuprolide, 6 Month, (ELIGARD) 45 MG injection Inject 45 mg into the skin every 6 (six) months.   Yes Historical Provider, MD  lidocaine (XYLOCAINE) 2 % solution Take 5 mLs by mouth 5 (five) times daily as needed for mouth pain.   Yes Historical Provider, MD  Melatonin 5 MG TABS Take 1 tablet by mouth at bedtime as needed (for sleep).    Yes Historical Provider, MD  mometasone (ASMANEX) 220 MCG/INH inhaler Inhale 1 puff into the lungs 2 (two) times daily.   Yes Historical Provider, MD  montelukast (SINGULAIR) 10 MG tablet Take 10 mg by mouth daily.   Yes Historical Provider, MD  prochlorperazine (COMPAZINE) 10 MG tablet Take 10 mg by mouth every 6 (six) hours as needed for nausea or vomiting.   Yes Historical Provider, MD  pyridOXINE (VITAMIN B-6) 100 MG tablet Take 100 mg by mouth daily.   Yes Historical Provider, MD  solifenacin (VESICARE) 10 MG tablet Take 10 mg by mouth at bedtime.   Yes Historical Provider, MD  tamsulosin (FLOMAX) 0.4 MG CAPS capsule Take 0.4 mg by mouth daily.   Yes Historical Provider, MD  vitamin B-12 (CYANOCOBALAMIN) 1000 MCG tablet Take 1,000 mcg by mouth daily.   Yes Historical Provider, MD  montelukast (SINGULAIR) 10 MG tablet TAKE 1 TABLET BY MOUTH DAILY AS NEEDED FOR SINUS DRAINAGE Patient not taking: Reported on 07/06/2014 06/11/14   Birdie Sons, MD      VITAL SIGNS:  Blood pressure 129/79, pulse 92, temperature 97.8 F (36.6 C), temperature source Oral, resp. rate 16, height 5\' 7"  (1.702 m), weight 68.04 kg (150 lb), SpO2 95 %.  PHYSICAL EXAMINATION:  Physical Exam  GENERAL:  81 y.o.-year-old patient lying in the bed with no acute distress.  EYES: Pupils equal, round, reactive to light and accommodation. No scleral icterus. Extraocular muscles  intact.  HEENT: Head atraumatic, normocephalic. Oropharynx and nasopharynx clear. No oropharyngeal erythema, dry oral mucosa. Positive oral ulcers. NECK:  Supple, no jugular venous distention. No thyroid enlargement, no tenderness.  LUNGS: Normal breath sounds bilaterally, no wheezing, rales, rhonchi. No use of accessory muscles of respiration.  CARDIOVASCULAR: S1, S2 RRR. No murmurs, rubs, gallops, clicks.  ABDOMEN: Soft, nontender, nondistended. Bowel sounds present. No organomegaly or mass.  EXTREMITIES: No pedal edema, cyanosis, or clubbing. + 2 pedal & radial pulses b/l.   NEUROLOGIC: Cranial nerves II through XII are intact. No focal Motor or sensory deficits appreciated b/l PSYCHIATRIC: The patient is alert and oriented x 3. Good affect.  SKIN: No obvious rash, lesion, or ulcer.   LABORATORY PANEL:   CBC  Recent Labs Lab 07/06/14 1631  WBC 0.5*  HGB 10.6*  HCT 32.2*  PLT 167   ------------------------------------------------------------------------------------------------------------------  Chemistries   Recent Labs Lab 07/06/14 1631  NA 135  K 4.1  CL 103  CO2 25  GLUCOSE 186*  BUN 15  CREATININE 0.95  CALCIUM 7.7*  AST 18  ALT 16*  ALKPHOS 97  BILITOT 1.0   ------------------------------------------------------------------------------------------------------------------  Cardiac Enzymes  Recent Labs Lab 07/06/14 1631  TROPONINI <0.03   ------------------------------------------------------------------------------------------------------------------  RADIOLOGY:  Dg Chest 2 View  07/06/2014   CLINICAL DATA:  Prostate cancer, started recent chemotherapy treatment at [place] last 2 Wednesday, fever  EXAM: CHEST  2 VIEW  COMPARISON:  02/15/2008  FINDINGS: Cardiomediastinal silhouette is stable. No acute infiltrate or pleural effusion. No pulmonary edema. Mild degenerative changes mid and lower thoracic spine.  IMPRESSION: No active cardiopulmonary disease.    Electronically Signed   By: Lahoma Crocker M.D.   On: 07/06/2014 17:40     IMPRESSION AND PLAN:   81 year old male with past medical history of prostate cancer currently undergoing chemotherapy, hypertension, hyperlipidemia, urinary incontinence who presented to the hospital due to neutropenic fever.   #1 neutropenic fever-patient has fever 100.9 at home but not here in the emergency room. -There is no clear source of infection. Chest x-ray is negative for an acute pneumonia, urinalysis is still pending. Although he has no acute urinary symptoms, there is no acute rashes, diarrhea. -We'll empirically treat patient with vancomycin and ceftazidime. Follow blood cultures, follow fever curve.  #2 neutropenia-likely secondary to the chemotherapy. We'll go ahead and give 1 dose of Neulasta. Follow white cell count.  #3 hypertension-presently hemodynamically stable. -Continue Norvasc, atenolol.  #4 oral sores-this is likely secondary to the chemotherapy. -Continue nystatin swish and swallow, viscous lidocaine.  #5 hyperlipidemia-continue atorvastatin.  #6 history of prostate cancer with ongoing chemotherapy-followed by oncology at [facility]. - Continue Flomax    All the records are reviewed and case discussed with ED provider. Management plans discussed with the patient, family and they are in agreement.  CODE STATUS: Full  TOTAL TIME TAKING CARE OF THIS PATIENT: 45 minutes.    Henreitta Leber M.D on 07/06/2014 at 8:19 PM  Between 7am to 6pm - Pager - [phone]  After 6pm go to [email or web address] - password [email or web address] [place] [facility]  Office  [phone]  CC: Primary care physician; Lelon Huh, MD

## 2014-07-06 NOTE — Plan of Care (Signed)
Problem: Discharge Progression Outcomes Goal: Discharge plan in place and appropriate Outcome: Progressing Pt goes by Marcus Blevins, lives at home with his wife, adult children are also very involved in pt's care. Pt is followed by [facility] oncology for prostate cancer, he also has a hx of htn and hyperlipidemia that he is on home medication for.  Goal: Other Discharge Outcomes/Goals Outcome: Progressing Pt remains afebrile, sob on exertion sats at 96% on 3L O2. He is not on oxygen at home. Pt complains of nausea and generalized pain that comes and goes, prn medication given with relief. Pt is a x1 assist to bsc or urinal at bedside. Pt received neupogen shot this shift.

## 2014-07-06 NOTE — ED Notes (Signed)
Lab called with alert value. Pts WBC are 0.5.  Dr Cinda Quest made aware of the results.

## 2014-07-06 NOTE — ED Notes (Addendum)
Wife reports first chemo therapy treatment at [place] last Wednesday. on Pt has prostate Cancer. Wife reports he had a fever almost 101 today. Pt reports sores in mouth that are painful since last Wednesday.

## 2014-07-06 NOTE — ED Notes (Signed)
Two sets of Blood culture taken one set from left [facility] and second from left hand

## 2014-07-07 DIAGNOSIS — Z79899 Other long term (current) drug therapy: Secondary | ICD-10-CM

## 2014-07-07 DIAGNOSIS — C7951 Secondary malignant neoplasm of bone: Secondary | ICD-10-CM

## 2014-07-07 DIAGNOSIS — E785 Hyperlipidemia, unspecified: Secondary | ICD-10-CM

## 2014-07-07 DIAGNOSIS — R1310 Dysphagia, unspecified: Secondary | ICD-10-CM

## 2014-07-07 DIAGNOSIS — Z87891 Personal history of nicotine dependence: Secondary | ICD-10-CM

## 2014-07-07 DIAGNOSIS — D709 Neutropenia, unspecified: Secondary | ICD-10-CM

## 2014-07-07 DIAGNOSIS — K1370 Unspecified lesions of oral mucosa: Secondary | ICD-10-CM

## 2014-07-07 DIAGNOSIS — C779 Secondary and unspecified malignant neoplasm of lymph node, unspecified: Secondary | ICD-10-CM | POA: Diagnosis not present

## 2014-07-07 DIAGNOSIS — C61 Malignant neoplasm of prostate: Secondary | ICD-10-CM | POA: Diagnosis not present

## 2014-07-07 DIAGNOSIS — I10 Essential (primary) hypertension: Secondary | ICD-10-CM

## 2014-07-07 DIAGNOSIS — Z7982 Long term (current) use of aspirin: Secondary | ICD-10-CM

## 2014-07-07 DIAGNOSIS — R634 Abnormal weight loss: Secondary | ICD-10-CM

## 2014-07-07 DIAGNOSIS — Z923 Personal history of irradiation: Secondary | ICD-10-CM

## 2014-07-07 DIAGNOSIS — R509 Fever, unspecified: Secondary | ICD-10-CM

## 2014-07-07 DIAGNOSIS — Z8551 Personal history of malignant neoplasm of bladder: Secondary | ICD-10-CM

## 2014-07-07 LAB — BASIC METABOLIC PANEL
Anion gap: 7 (ref 5–15)
BUN: 16 mg/dL (ref 6–20)
CHLORIDE: 103 mmol/L (ref 101–111)
CO2: 24 mmol/L (ref 22–32)
Calcium: 7.1 mg/dL — ABNORMAL LOW (ref 8.9–10.3)
Creatinine, Ser: 0.9 mg/dL (ref 0.61–1.24)
GFR calc Af Amer: >60 mL/min (ref >60)
GFR calc non Af Amer: >60 mL/min (ref >60)
Glucose, Bld: 129 mg/dL — ABNORMAL HIGH (ref 65–99)
POTASSIUM: 3.9 mmol/L (ref 3.5–5.1)
SODIUM: 134 mmol/L — AB (ref 135–145)

## 2014-07-07 LAB — CBC
HCT: 27.7 % — ABNORMAL LOW (ref 40.0–52.0)
Hemoglobin: 9.4 g/dL — ABNORMAL LOW (ref 13.0–18.0)
MCH: 27.1 pg (ref 26.0–34.0)
MCHC: 34 g/dL (ref 32.0–36.0)
MCV: 79.6 fL — AB (ref 80.0–100.0)
Platelets: 118 10*3/uL — ABNORMAL LOW (ref 150–440)
RBC: 3.48 MIL/uL — AB (ref 4.40–5.90)
RDW: 15.7 % — AB (ref 11.5–14.5)
WBC: 0.8 10*3/uL — CL (ref 3.8–10.6)

## 2014-07-07 MED ORDER — CALCIUM CARBONATE ANTACID 500 MG PO CHEW
1.0000 | CHEWABLE_TABLET | Freq: Four times a day (QID) | ORAL | Status: DC | PRN
  Administered 2014-07-07: 200 mg via ORAL
  Filled 2014-07-07: qty 1

## 2014-07-07 MED ORDER — ENSURE ENLIVE PO LIQD
237.0000 mL | Freq: Three times a day (TID) | ORAL | Status: DC
  Administered 2014-07-08 (×2): 237 mL via ORAL

## 2014-07-07 MED ORDER — VANCOMYCIN HCL IN DEXTROSE 1-5 GM/200ML-% IV SOLN
1000.0000 mg | INTRAVENOUS | Status: DC
  Administered 2014-07-07: 09:00:00 1000 mg via INTRAVENOUS
  Filled 2014-07-07 (×2): qty 200

## 2014-07-07 NOTE — Progress Notes (Signed)
[place] at [place] NAME: Sandford Diop    MR#:  [id number]  DATE OF BIRTH:  [birth year]  SUBJECTIVE:  Feels much better today. No fever spike. Oral sores no pain  REVIEW OF SYSTEMS:   Review of Systems  Constitutional: Negative for fever, chills and weight loss.  HENT: Negative for ear discharge, ear pain and nosebleeds.   Eyes: Negative for blurred vision, pain and discharge.  Respiratory: Negative for sputum production, shortness of breath, wheezing and stridor.   Cardiovascular: Negative for chest pain, palpitations, orthopnea and PND.  Gastrointestinal: Negative for nausea, vomiting, abdominal pain and diarrhea.  Genitourinary: Negative for urgency and frequency.  Musculoskeletal: Negative for back pain and joint pain.  Neurological: Negative for sensory change, speech change, focal weakness and weakness.  Psychiatric/Behavioral: Negative for depression. The patient is not nervous/anxious.   All other systems reviewed and are negative.  Tolerating Diet:yes  DRUG ALLERGIES:  No Known Allergies  VITALS:  Blood pressure 109/41, pulse 92, temperature 98.1 F (36.7 C), temperature source Oral, resp. rate 20, height 5\' 7"  (1.702 m), weight 65 kg (143 lb 4.8 oz), SpO2 92 %.  PHYSICAL EXAMINATION:   Physical Exam  GENERAL:  81 y.o.-year-old patient lying in the bed with no acute distress.  EYES: Pupils equal, round, reactive to light and accommodation. No scleral icterus. Extraocular muscles intact.  HEENT: Head atraumatic, normocephalic. Oropharynx and nasopharynx clear. Positive oral ulcers NECK:  Supple, no jugular venous distention. No thyroid enlargement, no tenderness.  LUNGS: Normal breath sounds bilaterally, no wheezing, rales, rhonchi. No use of accessory muscles of respiration.  CARDIOVASCULAR: S1, S2 normal. No murmurs, rubs, or gallops.  ABDOMEN: Soft, nontender, nondistended. Bowel sounds present. No organomegaly  or mass.  EXTREMITIES: No cyanosis, clubbing or edema b/l.    NEUROLOGIC: Cranial nerves II through XII are intact. No focal Motor or sensory deficits b/l.   PSYCHIATRIC: The patient is alert and oriented x 3.  SKIN: No obvious rash, lesion, or ulcer.    LABORATORY PANEL:   CBC  Recent Labs Lab 07/07/14 0440  WBC 0.8*  HGB 9.4*  HCT 27.7*  PLT 118*    Chemistries   Recent Labs Lab 07/06/14 1631 07/07/14 0440  NA 135 134*  K 4.1 3.9  CL 103 103  CO2 25 24  GLUCOSE 186* 129*  BUN 15 16  CREATININE 0.95 0.90  CALCIUM 7.7* 7.1*  AST 18  --   ALT 16*  --   ALKPHOS 97  --   BILITOT 1.0  --     Cardiac Enzymes  Recent Labs Lab 07/06/14 1631  TROPONINI <0.03    RADIOLOGY:  Dg Chest 2 View  07/06/2014   CLINICAL DATA:  Prostate cancer, started recent chemotherapy treatment at [place] last 2 Wednesday, fever  EXAM: CHEST  2 VIEW  COMPARISON:  02/15/2008  FINDINGS: Cardiomediastinal silhouette is stable. No acute infiltrate or pleural effusion. No pulmonary edema. Mild degenerative changes mid and lower thoracic spine.  IMPRESSION: No active cardiopulmonary disease.   Electronically Signed   By: Lahoma Crocker M.D.   On: 07/06/2014 17:40     ASSESSMENT AND PLAN:   81 year old male with past medical history of prostate cancer currently undergoing chemotherapy, hypertension, hyperlipidemia, urinary incontinence who presented to the hospital due to neutropenic fever.   #1 neutropenic fever-patient has fever 100.9 at home but not here since admission -There is no clear source of infection. Chest x-ray is  negative for an acute pneumonia, urinalysis is neg -We'll empirically treat patie nt with vancomycin and ceftazidime. Follow blood cultures, follow fever curve.  #2 neutropenia-likely secondary to the chemotherapy. -recvd 1 dose of Neulasta June 29th Follow white cell count. -0.5-->0.8 -dr Alvia Grove to see pt. Spoke with her  #3 hypertension-presently hemodynamically  stable. -Continue Norvasc, atenolol.  #4 oral sores-this is likely secondary to the chemotherapy. -Continue nystatin swish and swallow, viscous lidocaine.  #5 hyperlipidemia-continue atorvastatin.  #6 history of prostate cancer with ongoing chemotherapy-followed by oncology at [facility]. - Continue Flomax  Case discussed with Care Management/Social Worker. Management plans discussed with the patient, family and they are in agreement.  CODE STATUS: full  DVT Prophylaxis: heparin  TOTAL TIME TAKING CARE OF THIS PATIENT: 35 minutes.  >50% time spent on counselling and coordination of care  POSSIBLE D/C IN 1-2 DAYS, DEPENDING ON CLINICAL CONDITION.   Jazzalyn Loewenstein M.D on 07/07/2014 at 11:08 AM  Between 7am to 6pm - Pager - [phone]  After 6pm go to [email or web address] - password [email or web address] [place] [facility]  Office  [phone]  CC: Primary care physician; Lelon Huh, MD

## 2014-07-07 NOTE — Progress Notes (Addendum)
ANTIBIOTIC CONSULT NOTE - INITIAL  Pharmacy Consult for vancomycin and ceftazidime dosing Indication: febrile neutropenia  No Known Allergies  Patient Measurements: Height: 5\' 7"  (170.2 cm) Weight: 143 lb 4.8 oz (65 kg) IBW/kg (Calculated) : 66.1 Adjusted Body Weight: 65 kg  Vital Signs: Temp: 99.3 F (37.4 C) (06/29 2135) Temp Source: Oral (06/29 2135) BP: 128/51 mmHg (06/29 2135) Pulse Rate: 88 (06/29 2135) Intake/Output from previous day: 06/29 0701 - 06/30 0700 In: -  Out: 100 [Urine:100] Intake/Output from this shift: Total I/O In: -  Out: 100 [Urine:100]  Labs:  Recent Labs  07/06/14 1631  WBC 0.5*  HGB 10.6*  PLT 167  CREATININE 0.95   Estimated Creatinine Clearance: 53.2 mL/min (by C-G formula based on Cr of 0.95). No results for input(s): VANCOTROUGH, VANCOPEAK, VANCORANDOM, GENTTROUGH, GENTPEAK, GENTRANDOM, TOBRATROUGH, TOBRAPEAK, TOBRARND, AMIKACINPEAK, AMIKACINTROU, AMIKACIN in the last 72 hours.   Microbiology: No results found for this or any previous visit (from the past 720 hour(s)).  Medical History: Past Medical History  Diagnosis Date  . Cancer     prostate  . Heart disease   . Prostate cancer 2014    8 weeks of radiation, [facility]  . Hypertension   . Hyperlipidemia     Medications:   Assessment: Blood and urine cx pending CXR: no active disease  Goal of Therapy:  Vancomycin trough level 15-20 mcg/ml  Plan:  TBW 65kg IBW 66.1kg  DW 65kg  Vd 46L kei 0.046 hr-1  t1/2 14 hours 1 gram vancomycin q 18 hours ordered with stacked dosing. Level before 5th dose.  Ceftazidime 2 grams q 8 hours ordered.   Mattheo Swindle S 07/07/2014,12:08 AM

## 2014-07-07 NOTE — Progress Notes (Signed)
[facility]  Date of admission:  07/06/2014  Inpatient day:  07/07/2014  Consulting physician: Fritzi Mandes, MD  Reason for consultation:  Prostate cancer and neutropenic fever  Chief Complaint: Marcus Blevins is a 81 y.o. male  with metastatic prostate cancer who was admitted with fever and neutropenia.  HPI: The patient was diagnosed with prostate cancer in 2014. Biopsy on 02/06/2012 revealed Gleason 4+4 = 8 adenocarcinoma in 3 of 14 cores and 4+3=7 adenocarcinoma and 4 of 14 cores. He completed external beam radiation in 07/2012 and androgen deprivation therapy (ADT) in 09/2012. PSA nadir was 0.06 on 05/04/2013.  The patient's PSA began to climb. PSA on 04/01/2014 was 7.01 and 8.41 on 04/21/2014. Bone  scan on 04/20/2014 revealed extensive multifocal metastasis. Chest, abdomen, and pelvic CT scan on 04/20/2014 revealed centimeter aortocaval lymph nodes and a 1.9 cm right pelvic sidewall lymph node.  He began androgen deprivation therapy with Lupron and Casodex in 04/2014. Decision was made to begin the chemotherapy. On 06/29/2014, he received cycle #1 Taxotere.  He did not receive Neulasta support. He tolerated his chemotherapy well.  He developed fever and chills on 07/06/2014. He notes trouble eating and mouth sores. He has lost about 5 pounds in the past week. He describes chronic urinary symptoms secondary to radiation. He denies any cough.  He has a history of bladder cancer status post TURBT on 03/15/2014. Pathology revealed high-grade, noninvasive papillary urothelial cancer.  He received intravesical BCG in 05/2014.  Past Medical History  Diagnosis Date  . Cancer     prostate  . Heart disease   . Prostate cancer 2014    8 weeks of radiation, [facility]  . Hypertension   . Hyperlipidemia     Past Surgical History  Procedure Laterality Date  . Colonoscopy  2007  . Cholecystectomy  1958  . Bladder scraping  03-15-14    [facility], for bladder cancer  . Tumor  removed from bladder      Family History  Problem Relation Age of Onset  . CVA Mother   . Heart attack Father     Social History:  reports that he quit smoking about 60 years ago. He has never used smokeless tobacco. He reports that he does not drink alcohol or use illicit drugs.  He is retired from [employer] at [employer]. He lives in [place] with his wife. 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 He is accompanied by his daughter today.  Allergies: No Known Allergies  Medications Prior to Admission  Medication Sig Dispense Refill  . amLODipine (NORVASC) 5 MG tablet Take 5 mg by mouth daily.    Marland Kitchen aspirin EC 81 MG tablet Take 81 mg by mouth daily.    Marland Kitchen atenolol (TENORMIN) 50 MG tablet Take 50 mg by mouth daily.    Marland Kitchen atorvastatin (LIPITOR) 20 MG tablet Take 20 mg by mouth at bedtime.    . clotrimazole-betamethasone (LOTRISONE) cream Apply 1 application topically 2 (two) times daily.    Marland Kitchen dexamethasone (DECADRON) 4 MG tablet Take 8 mg by mouth 2 (two) times daily. Pt only takes the day before and day after  DOCEtaxel.    . diphenhydramine-acetaminophen (TYLENOL PM) 25-500 MG TABS Take 1 tablet by mouth at bedtime as needed (for sleep).    . fluticasone (FLONASE) 50 MCG/ACT nasal spray Place 1 spray into both nostrils daily.    . folic acid (FOLVITE) 706 MCG tablet Take 400 mcg by mouth daily.    Marland Kitchen  leuprolide, 6 Month, (ELIGARD) 45 MG injection Inject 45 mg into the skin every 6 (six) months.    . lidocaine (XYLOCAINE) 2 % solution Take 5 mLs by mouth 5 (five) times daily as needed for mouth pain.    . Melatonin 5 MG TABS Take 1 tablet by mouth at bedtime as needed (for sleep).     . mometasone (ASMANEX) 220 MCG/INH inhaler Inhale 1 puff into the lungs 2 (two) times daily.    . montelukast (SINGULAIR) 10 MG tablet Take 10 mg by mouth daily.    . prochlorperazine (COMPAZINE) 10 MG tablet Take 10 mg by mouth every 6 (six) hours as needed for nausea or vomiting.    . pyridOXINE (VITAMIN B-6)  100 MG tablet Take 100 mg by mouth daily.    . solifenacin (VESICARE) 10 MG tablet Take 10 mg by mouth at bedtime.    . tamsulosin (FLOMAX) 0.4 MG CAPS capsule Take 0.4 mg by mouth daily.    . vitamin B-12 (CYANOCOBALAMIN) 1000 MCG tablet Take 1,000 mcg by mouth daily.    . montelukast (SINGULAIR) 10 MG tablet TAKE 1 TABLET BY MOUTH DAILY AS NEEDED FOR SINUS DRAINAGE (Patient not taking: Reported on 07/06/2014) 30 tablet 3     Review of Systems: GENERAL:  Feels better tday.  Fver and chills at home (now resolved).  Weight loss of 5# in past week. PERFORMANCE STATUS (ECOG):  1 HEENT:  Mouth sores and thrush.  No visual changes or runny nose. Lungs: No shortness of breath or cough.  No hemoptysis. Cardiac:  No chest pain, palpitations, orthopnea, or PND. GI:  Constipation.  No nausea, vomiting, diarrhea, melena or hematochezia. GU:  Chronic urinary symptoms (frequency and nocturia).  No hematuria. Musculoskeletal:  General achiness.  No muscle tenderness. Extremities:  No pain or swelling. Skin:  No rashes or skin changes. Neuro:  No headache, numbness or weakness, balance or coordination issues. Endocrine:  No diabetes, thyroid issues, hot flashes or night sweats. Psych:  No mood changes, depression or anxiety. Pain:  No focal pain. Review of systems:  All other systems reviewed and found to be negative.   Physical Exam:  Blood pressure 127/50, pulse 86, temperature 97.5 F (36.4 C), temperature source Oral, resp. rate 19, height 5' 7"  (1.702 m), weight 143 lb 4.8 oz (65 kg), SpO2 93 %.  GENERAL:  Well developed, well nourished, elderly sitting comfortably on the medical unit in no acute distress. MENTAL STATUS:  Alert and oriented to person, place and time. HEAD:  Pearline Cables hair.  Normocephalic, atraumatic, face symmetric, no Cushingoid features. EYES:  Blue eyes.  Pupils equal round and reactive to light and accomodation.  No conjunctivitis or scleral icterus. ENT:  Dry mouth.  Resolving  thrush.  Mild mucositis.  RESPIRATORY:  Clear to auscultation without rales, wheezes or rhonchi. CARDIOVASCULAR:  Regular rate and rhythm without murmur, rub or gallop. ABDOMEN:  Soft, non-tender, with active bowel sounds, and no hepatosplenomegaly.  No masses. SKIN:  No rashes, ulcers or lesions. EXTREMITIES: No edema, no skin discoloration or tenderness.  No palpable cords. LYMPH NODES: No palpable cervical, supraclavicular, axillary or inguinal adenopathy  NEUROLOGICAL: Unremarkable. PSYCH:  Appropriate.   Results for orders placed or performed during the hospital encounter of 07/06/14 (from the past 48 hour(s))  CBC WITH DIFFERENTIAL     Status: Abnormal   Collection Time: 07/06/14  4:31 PM  Result Value Ref Range   WBC 0.5 (LL) 3.8 - 10.6 K/uL  Comment: RESULT REPEATED AND VERIFIED CRITICAL RESULT CALLED TO, READ BACK BY AND VERIFIED WITH: CRYSTAL MOORE ON 07/06/14 AT 1824 BY TB    RBC 4.03 (L) 4.40 - 5.90 MIL/uL   Hemoglobin 10.6 (L) 13.0 - 18.0 g/dL   HCT 32.2 (L) 40.0 - 52.0 %   MCV 79.7 (L) 80.0 - 100.0 fL   MCH 26.4 26.0 - 34.0 pg   MCHC 33.1 32.0 - 36.0 g/dL   RDW 15.7 (H) 11.5 - 14.5 %   Platelets 167 150 - 440 K/uL   Neutrophils Relative % 0 (L) 43 - 77 %   Lymphocytes Relative 96 (H) 12 - 46 %   Monocytes Relative 4 3 - 12 %   Eosinophils Relative 0 0 - 5 %   Basophils Relative 0 0 - 1 %   Band Neutrophils 0 0 - 10 %   Metamyelocytes Relative 0 %   Myelocytes 0 %   Promyelocytes Absolute 0 %   Blasts 0 %   nRBC 0 0 /100 WBC   Other 0 %   Neutro Abs 0.0 (L) 1.7 - 7.7 K/uL   Lymphs Abs 0.5 (L) 0.7 - 4.0 K/uL   Monocytes Absolute 0.0 (L) 0.1 - 1.0 K/uL   Eosinophils Absolute 0.0 0.0 - 0.7 K/uL   Basophils Absolute 0.0 0.0 - 0.1 K/uL   RBC Morphology POLYCHROMASIA PRESENT    WBC Morphology ATYPICAL LYMPHOCYTES     Comment: SMUDGE CELLS   Smear Review LARGE PLATELETS PRESENT   Comprehensive metabolic panel     Status: Abnormal   Collection Time: 07/06/14   4:31 PM  Result Value Ref Range   Sodium 135 135 - 145 mmol/L   Potassium 4.1 3.5 - 5.1 mmol/L   Chloride 103 101 - 111 mmol/L   CO2 25 22 - 32 mmol/L   Glucose, Bld 186 (H) 65 - 99 mg/dL   BUN 15 6 - 20 mg/dL   Creatinine, Ser 0.95 0.61 - 1.24 mg/dL   Calcium 7.7 (L) 8.9 - 10.3 mg/dL   Total Protein 6.7 6.5 - 8.1 g/dL   Albumin 3.3 (L) 3.5 - 5.0 g/dL   AST 18 15 - 41 U/L   ALT 16 (L) 17 - 63 U/L   Alkaline Phosphatase 97 38 - 126 U/L   Total Bilirubin 1.0 0.3 - 1.2 mg/dL   GFR calc non Af Amer >60 >60 mL/min   GFR calc Af Amer >60 >60 mL/min    Comment: (NOTE) The eGFR has been calculated using the CKD EPI equation. This calculation has not been validated in all clinical situations. eGFR's persistently <60 mL/min signify possible Chronic Kidney Disease.    Anion gap 7 5 - 15  Troponin I     Status: None   Collection Time: 07/06/14  4:31 PM  Result Value Ref Range   Troponin I <0.03 <0.031 ng/mL    Comment:        NO INDICATION OF MYOCARDIAL INJURY.   Urine culture     Status: None (Preliminary result)   Collection Time: 07/06/14  6:30 PM  Result Value Ref Range   Specimen Description URINE, RANDOM    Special Requests NONE    Culture HOLDING FOR POSSIBLE PATHOGEN    Report Status PENDING   Basic metabolic panel     Status: Abnormal   Collection Time: 07/07/14  4:40 AM  Result Value Ref Range   Sodium 134 (L) 135 - 145 mmol/L   Potassium 3.9  3.5 - 5.1 mmol/L   Chloride 103 101 - 111 mmol/L   CO2 24 22 - 32 mmol/L   Glucose, Bld 129 (H) 65 - 99 mg/dL   BUN 16 6 - 20 mg/dL   Creatinine, Ser 0.90 0.61 - 1.24 mg/dL   Calcium 7.1 (L) 8.9 - 10.3 mg/dL   GFR calc non Af Amer >60 >60 mL/min   GFR calc Af Amer >60 >60 mL/min    Comment: (NOTE) The eGFR has been calculated using the CKD EPI equation. This calculation has not been validated in all clinical situations. eGFR's persistently <60 mL/min signify possible Chronic Kidney Disease.    Anion gap 7 5 - 15  CBC      Status: Abnormal   Collection Time: 07/07/14  4:40 AM  Result Value Ref Range   WBC 0.8 (LL) 3.8 - 10.6 K/uL    Comment: RESULT REPEATED AND VERIFIED CRITICAL VALUE NOTED.  VALUE IS CONSISTENT WITH PREVIOUSLY REPORTED AND CALLED VALUE. CORRECTED ON 06/30 AT 6010: PREVIOUSLY REPORTED AS 0.8    RBC 3.48 (L) 4.40 - 5.90 MIL/uL   Hemoglobin 9.4 (L) 13.0 - 18.0 g/dL   HCT 27.7 (L) 40.0 - 52.0 %   MCV 79.6 (L) 80.0 - 100.0 fL   MCH 27.1 26.0 - 34.0 pg   MCHC 34.0 32.0 - 36.0 g/dL   RDW 15.7 (H) 11.5 - 14.5 %   Platelets 118 (L) 150 - 440 K/uL   Dg Chest 2 View  07/06/2014   CLINICAL DATA:  Prostate cancer, started recent chemotherapy treatment at [facility] last 2 Wednesday, fever  EXAM: CHEST  2 VIEW  COMPARISON:  02/15/2008  FINDINGS: Cardiomediastinal silhouette is stable. No acute infiltrate or pleural effusion. No pulmonary edema. Mild degenerative changes mid and lower thoracic spine.  IMPRESSION: No active cardiopulmonary disease.   Electronically Signed   By: Lahoma Crocker M.D.   On: 07/06/2014 17:40    Assessment:  The patient is a 81 y.o. gentleman with metastatic prostate cancer currently day 8 status post cycle #1 Taxotere who was admitted with fever and neutropenia.  He has no localizing signs.  He has chronic urinary symptoms.  CXR is normal.  Urine culture pending.  No positive blood cultures.  Plan:   1)  Hematology/Oncology:  Day 8 s/p Taxotere without Neulasta support.  Slight improvement after 1 dose of GCSF (Neupogen).  Continue GCSF daily.  Neutropenic precautions.  On Lovenox for DVT prophylaxis.  Watching platelet count. 2)  Infectious disease:  Afebrile on Ceftazidime.  Vancomycin and Primaxin given in the ER on 07/06/2014.  Nystatin for thrush.  Blood culture q 24 hour prn temp >= 100.4. 3)  Fluids/electrolytes/nutrition:  Poor po and weight loss secondary to mucositis.  Should improve with count recovery.   4) Disposition:  Await 72 hours with negative cultures prior to  consideration of discharge on oral antibiotics.   Thank you for allowing me to participate in Marcus Blevins 's care.  I will follow him closely with you while hospitalized.  Lequita Asal, MD  07/07/2014, 11:56 PM

## 2014-07-07 NOTE — Progress Notes (Signed)
Pt activated bedalarm with wife helping pt to BR. Wife states he wouldn't wait. Pt was concerned he would "make a mess." No stool. Pt had 3cm bright red spot on toilet tissue with no further bleeding noted. Anal area has mild excoriation with no sign active bleeding or hemorrhoid. Safety education reinforced with pt and wife with stated understanding. Bed alarm reset.

## 2014-07-07 NOTE — Progress Notes (Signed)
Patient now on room air, sat 93%. Madlyn Frankel, RN

## 2014-07-07 NOTE — Progress Notes (Signed)
Initial Nutrition Assessment  INTERVENTION:  Meals and Snacks: Cater to patient preferences; will send meats chopped with extra gravy on the side. Will order neutropenic precautions such as no fresh fruits/vegetables on meal trays.  Medical Food Supplement Therapy: will recommend Magic Cup at dinner and Ensure Enlive TID; each Ensure Enlive (each supplement provides 350kcal and 20 grams of protein)  Education: per daughter request provided "High-Calorie High-Protein Nutrition Therapy" handout from the Academy of Nutrition and Dietetics. Discussed eating small frequent meals and snacks to assist in increasing overall po intake as well as soft foods easier with mouth sores. Also provided handout on "Head and Neck Cancer Nutrition Therapy" as handout has recommendations for certain symptoms such as thrush, mouth sores, dysgeusia and/or difficulty chewing.  NUTRITION DIAGNOSIS:  Inadequate oral intake related to cancer and cancer related treatments as evidenced by per patient/family report.  GOAL:  Patient will meet greater than or equal to 90% of their needs  MONITOR:   (Energy Intake, Digsetive system, Anthropometrics)  REASON FOR ASSESSMENT:  Malnutrition Screening Tool    ASSESSMENT:  Pt admitted with neutropenic fevers. Pt with prostate cancer currently undergoing chemotherapy under [facility]. PMHx:  Past Medical History  Diagnosis Date  . Cancer     prostate  . Heart disease   . Prostate cancer 2014    8 weeks of radiation, [facility]  . Hypertension   . Hyperlipidemia     Diet Order:  Diet regular Room service appropriate?: Yes; Fluid consistency:: Thin  Current Nutrition: Pt ate 25% of breakfast this am on visit.   Food/Nutrition-Related History: Pt reports eating nothing for a few days PTA secondary to mouth sores. Pt reports visiting dentist who provided a medication that helped numb the pain and pt was able to eat soft foods. Pt reports not drinking any supplement  drinks but states "I am ready to now."  Medications: folic acid, vitamin B6, vitamin B12  Electrolyte/Renal Profile and Glucose Profile:   Recent Labs Lab 07/06/14 1631 07/07/14 0440  NA 135 134*  K 4.1 3.9  CL 103 103  CO2 25 24  BUN 15 16  CREATININE 0.95 0.90  CALCIUM 7.7* 7.1*  GLUCOSE 186* 129*   Protein Profile:   Recent Labs Lab 07/06/14 1631  ALBUMIN 3.3*    Nutritional Anemia Profile:  CBC Latest Ref Rng 07/07/2014 07/06/2014  WBC 3.8 - 10.6 K/uL 0.8(LL) 0.5(LL)  Hemoglobin 13.0 - 18.0 g/dL 9.4(L) 10.6(L)  Hematocrit 40.0 - 52.0 % 27.7(L) 32.2(L)  Platelets 150 - 440 K/uL 118(L) 167   Gastrointestinal Profile: Last BM: 6/30   Nutrition-Focused Physical Exam Findings: Nutrition-Focused physical exam completed. Findings are WDL for fat depletion, muscle depletion, and edema.   Weight Change: Pt reports weight of 150lbs last Wednesday at an outpatient MD appointment (possible 5% weight loss in one week) Anthropometrics:  Height:  Ht Readings from Last 1 Encounters:  07/06/14 5\' 7"  (1.702 m)    Weight:  Wt Readings from Last 1 Encounters:  07/06/14 143 lb 4.8 oz (65 kg)    Wt Readings from Last 10 Encounters:  07/06/14 143 lb 4.8 oz (65 kg)  01/18/13 153 lb (69.4 kg)    BMI:  Body mass index is 22.44 kg/(m^2).  Estimated Nutritional Needs:  Kcal:  1707-2018kcals, BEE: 1294kcals, TEE: (IF 1.1-1.3)(AF 1.2)   Protein:  65-78g protein (1.0-1.2g/kg)  Fluid:  1625-1972mL of fluid (25-59mL/kg)  Skin:  Reviewed, no issues   EDUCATION NEEDS:  Education needs addressed  MODERATE Care Level  [place], [place], [place] Pager [phone]

## 2014-07-07 NOTE — Progress Notes (Signed)
Patient passed blood from rectum - clot size of a quarter. MD notified. Continue to monitor. Madlyn Frankel, RN

## 2014-07-07 NOTE — Plan of Care (Signed)
Problem: Discharge Progression Outcomes Goal: Other Discharge Outcomes/Goals Plan of care progress to goal:  Patient remains afebrile. Continues to c/o of shortness of breath with exertion. Patient on 3L-O2, sat 95%. C/o nausea x1 - relieved with antiemetic. Patient requesting TUMS for indigestion - dose given with relief. Up to bathroom with one person assist and using the urinal at the bedside. Wife at bedside.   Plan is to be discharged possibly tomorrow per Dr. Fritzi Mandes.

## 2014-07-07 NOTE — Progress Notes (Signed)
   07/07/14 1445  Clinical Encounter Type  Visited With Patient and family together  Visit Type Initial  Pastoral care and support to patient and his wife who was at bedside.  Pt was very calm and cheerful.  His wife told me that she hopes he can go home tomorrow.  Pt said he was doing better today than he was yesterday.  Patient and his wife thanked me for my visit.  [place] [phone]

## 2014-07-08 ENCOUNTER — Telehealth: Payer: Self-pay | Admitting: Family Medicine

## 2014-07-08 DIAGNOSIS — D709 Neutropenia, unspecified: Secondary | ICD-10-CM | POA: Diagnosis not present

## 2014-07-08 LAB — CBC WITH DIFFERENTIAL/PLATELET
Band Neutrophils: 13 % — ABNORMAL HIGH (ref 0–10)
Basophils Absolute: 0 10*3/uL (ref 0.0–0.1)
Basophils Relative: 0 % (ref 0–1)
Blasts: 0 %
Eosinophils Absolute: 0 10*3/uL (ref 0.0–0.7)
Eosinophils Relative: 1 % (ref 0–5)
HCT: 26.4 % — ABNORMAL LOW (ref 40.0–52.0)
Hemoglobin: 8.7 g/dL — ABNORMAL LOW (ref 13.0–18.0)
Lymphocytes Relative: 30 % (ref 12–46)
Lymphs Abs: 0.9 10*3/uL (ref 0.7–4.0)
MCH: 26.1 pg (ref 26.0–34.0)
MCHC: 33 g/dL (ref 32.0–36.0)
MCV: 79.2 fL — ABNORMAL LOW (ref 80.0–100.0)
Metamyelocytes Relative: 0 %
Monocytes Absolute: 0.6 10*3/uL (ref 0.1–1.0)
Monocytes Relative: 20 % — ABNORMAL HIGH (ref 3–12)
Myelocytes: 0 %
Neutro Abs: 1.6 10*3/uL — ABNORMAL LOW (ref 1.7–7.7)
Neutrophils Relative %: 36 % — ABNORMAL LOW (ref 43–77)
Other: 0 %
Platelets: 154 10*3/uL (ref 150–440)
Promyelocytes Absolute: 0 %
RBC: 3.33 MIL/uL — ABNORMAL LOW (ref 4.40–5.90)
RDW: 15.7 % — ABNORMAL HIGH (ref 11.5–14.5)
WBC: 3.1 10*3/uL — ABNORMAL LOW (ref 3.8–10.6)
nRBC: 1 /100 WBC — ABNORMAL HIGH

## 2014-07-08 LAB — URINE CULTURE: Culture: 60000

## 2014-07-08 MED ORDER — FILGRASTIM 300 MCG/ML IJ SOLN
300.0000 ug | Freq: Once | INTRAMUSCULAR | Status: AC
  Administered 2014-07-08: 08:00:00 300 ug via SUBCUTANEOUS
  Filled 2014-07-08: qty 1

## 2014-07-08 MED ORDER — SULFAMETHOXAZOLE-TRIMETHOPRIM 800-160 MG PO TABS
1.0000 | ORAL_TABLET | Freq: Two times a day (BID) | ORAL | Status: DC
  Administered 2014-07-08: 14:00:00 1 via ORAL
  Filled 2014-07-08: qty 1

## 2014-07-08 MED ORDER — NYSTATIN 100000 UNIT/ML MT SUSP: 5.0000 mL | Freq: Four times a day (QID) | OROMUCOSAL | Status: DC

## 2014-07-08 MED ORDER — SULFAMETHOXAZOLE-TRIMETHOPRIM 800-160 MG PO TABS: 1.0000 | ORAL_TABLET | Freq: Two times a day (BID) | ORAL | Status: DC

## 2014-07-08 MED ORDER — CEFUROXIME AXETIL 500 MG PO TABS: 500.0000 mg | ORAL_TABLET | Freq: Two times a day (BID) | ORAL | Status: DC

## 2014-07-08 MED ORDER — CEFUROXIME AXETIL 500 MG PO TABS
500.0000 mg | ORAL_TABLET | Freq: Two times a day (BID) | ORAL | Status: DC
  Filled 2014-07-08 (×3): qty 1

## 2014-07-08 MED ORDER — SODIUM CHLORIDE 0.9 % IV SOLN
1.0000 g | Freq: Once | INTRAVENOUS | Status: AC
  Administered 2014-07-08: 13:00:00 1 g via INTRAVENOUS
  Filled 2014-07-08: qty 1

## 2014-07-08 NOTE — Telephone Encounter (Signed)
Pt is being discharged 07/08/14 from [facility] for a neutropenic fever.  I have schedule a f/up for 07/20/2014/MJ

## 2014-07-08 NOTE — Progress Notes (Signed)
Reported to Dr Posey Pronto, ESBL in urine, placed on contact isolation.

## 2014-07-08 NOTE — Discharge Summary (Addendum)
[facility] NAME: Marcus Blevins    MR#:  [id number]  DATE OF BIRTH:  06-Aug-1927  DATE OF ADMISSION:  07/06/2014 ADMITTING PHYSICIAN: Henreitta Leber, MD  DATE OF DISCHARGE:07/08/14  PRIMARY CARE PHYSICIAN: Lelon Huh, MD    ADMISSION DIAGNOSIS:  Neutropenic fever [D70.9]  DISCHARGE DIAGNOSIS:  Neutropenic fever Oral ulcers Metastatic Prostate cancer s/p recent Chemo  SECONDARY DIAGNOSIS:   Past Medical History  Diagnosis Date  . Cancer     prostate  . Heart disease   . Prostate cancer 2014    8 weeks of radiation, [facility]  . Hypertension   . Hyperlipidemia     HOSPITAL COURSE:   81 year old male with past medical history of prostate cancer currently undergoing chemotherapy, hypertension, hyperlipidemia, urinary incontinence who presented to the hospital due to neutropenic fever.   #1 neutropenic fever-patient has fever 100.9 at home but not here since admission -There is no clear source of infection. Chest x-ray is negative for an acute pneumonia, urinalysis is neg -We'll empirically treat patie nt with vancomycin and ceftazidime. Neg blood cultures -afebrile -pt's UC grew ESBL ecoli. Spoke with ID and rec to start fosfomycin. D/w Pharmacy and will give bactrim since the former is quiet expensive and pt has no allergy to sulfa Creat ok  #2 neutropenia-likely secondary to the chemotherapy. -recvd 1 dose of Neulasta June 29th and July 1st -white cell count.0.5-->0.8-->3.1 -dr Alvia Grove input appreciate.  platelet count wnl  #3 hypertension-presently hemodynamically stable. -Continue Norvasc, atenolol.  #4 oral sores-this is likely secondary to the chemotherapy. -Continue nystatin swish and swallow, viscous lidocaine.  #5 hyperlipidemia-continue atorvastatin.  #6 history of prostate cancer with ongoing chemotherapy-followed by oncology at [facility]. - Continue Flomax  Overall appears stable. Pt requesting  if he can go home.  Management plans discussed with the patient, family and they are in agreement.  CODE STATUS: full  DVT Prophylaxis: heparin  DISCHARGE CONDITIONS:   fair  CONSULTS OBTAINED:  Treatment Team:  Lequita Asal, MD  DRUG ALLERGIES:  No Known Allergies  DISCHARGE MEDICATIONS:   Current Discharge Medication List    START taking these medications   Details  nystatin (MYCOSTATIN) 100000 UNIT/ML suspension Use as directed 5 mLs (500,000 Units total) in the mouth or throat 4 (four) times daily. Qty: 60 mL, Refills: 0    sulfamethoxazole-trimethoprim (BACTRIM DS,SEPTRA DS) 800-160 MG per tablet Take 1 tablet by mouth every 12 (twelve) hours. Qty: 14 tablet, Refills: 0      CONTINUE these medications which have NOT CHANGED   Details  amLODipine (NORVASC) 5 MG tablet Take 5 mg by mouth daily.    aspirin EC 81 MG tablet Take 81 mg by mouth daily.    atenolol (TENORMIN) 50 MG tablet Take 50 mg by mouth daily.    atorvastatin (LIPITOR) 20 MG tablet Take 20 mg by mouth at bedtime.    clotrimazole-betamethasone (LOTRISONE) cream Apply 1 application topically 2 (two) times daily.    dexamethasone (DECADRON) 4 MG tablet Take 8 mg by mouth 2 (two) times daily. Pt only takes the day before and day after  DOCEtaxel.    diphenhydramine-acetaminophen (TYLENOL PM) 25-500 MG TABS Take 1 tablet by mouth at bedtime as needed (for sleep).    fluticasone (FLONASE) 50 MCG/ACT nasal spray Place 1 spray into both nostrils daily.    folic acid (FOLVITE) 784 MCG tablet Take 400 mcg by mouth daily.    leuprolide, 6  Month, (ELIGARD) 45 MG injection Inject 45 mg into the skin every 6 (six) months.    lidocaine (XYLOCAINE) 2 % solution Take 5 mLs by mouth 5 (five) times daily as needed for mouth pain.    Melatonin 5 MG TABS Take 1 tablet by mouth at bedtime as needed (for sleep).     mometasone (ASMANEX) 220 MCG/INH inhaler Inhale 1 puff into the lungs 2 (two) times daily.     !! montelukast (SINGULAIR) 10 MG tablet Take 10 mg by mouth daily.    prochlorperazine (COMPAZINE) 10 MG tablet Take 10 mg by mouth every 6 (six) hours as needed for nausea or vomiting.    pyridOXINE (VITAMIN B-6) 100 MG tablet Take 100 mg by mouth daily.    solifenacin (VESICARE) 10 MG tablet Take 10 mg by mouth at bedtime.    tamsulosin (FLOMAX) 0.4 MG CAPS capsule Take 0.4 mg by mouth daily.    vitamin B-12 (CYANOCOBALAMIN) 1000 MCG tablet Take 1,000 mcg by mouth daily.    !! montelukast (SINGULAIR) 10 MG tablet TAKE 1 TABLET BY MOUTH DAILY AS NEEDED FOR SINUS DRAINAGE Qty: 30 tablet, Refills: 3     !! - Potential duplicate medications found. Please discuss with provider.     If you experience worsening of your admission symptoms, develop shortness of breath, life threatening emergency, suicidal or homicidal thoughts you must seek medical attention immediately by calling 911 or calling your MD immediately  if symptoms less severe.  You Must read complete instructions/literature along with all the possible adverse reactions/side effects for all the Medicines you take and that have been prescribed to you. Take any new Medicines after you have completely understood and accept all the possible adverse reactions/side effects.   Please note  You were cared for by a hospitalist during your hospital stay. If you have any questions about your discharge medications or the care you received while you were in the hospital after you are discharged, you can call the unit and asked to speak with the hospitalist on call if the hospitalist that took care of you is not available. Once you are discharged, your primary care physician will handle any further medical issues. Please note that NO REFILLS for any discharge medications will be authorized once you are discharged, as it is imperative that you return to your primary care physician (or establish a relationship with a primary care physician if you  do not have one) for your aftercare needs so that they can reassess your need for medications and monitor your lab values. Today   SUBJECTIVE   Doing well. Requesting to go home  VITAL SIGNS:  Blood pressure 118/60, pulse 87, temperature 99.4 F (37.4 C), temperature source Oral, resp. rate 20, height 5\' 7"  (1.702 m), weight 65 kg (143 lb 4.8 oz), SpO2 94 %.  I/O:    Intake/Output Summary (Last 24 hours) at 07/08/14 1012 Last data filed at 07/08/14 0900  Gross per 24 hour  Intake    220 ml  Output    600 ml  Net   -380 ml    PHYSICAL EXAMINATION:  GENERAL:  81 y.o.-year-old patient lying in the bed with no acute distress.  EYES: Pupils equal, round, reactive to light and accommodation. No scleral icterus. Extraocular muscles intact.  HEENT: Head atraumatic, normocephalic. Oropharynx and nasopharynx clear. Oral ulcers improving NECK:  Supple, no jugular venous distention. No thyroid enlargement, no tenderness.  LUNGS: Normal breath sounds bilaterally, no wheezing, rales,rhonchi or crepitation.  No use of accessory muscles of respiration.  CARDIOVASCULAR: S1, S2 normal. No murmurs, rubs, or gallops.  ABDOMEN: Soft, non-tender, non-distended. Bowel sounds present. No organomegaly or mass.  EXTREMITIES: No pedal edema, cyanosis, or clubbing.  NEUROLOGIC: Cranial nerves II through XII are intact. Muscle strength 5/5 in all extremities. Sensation intact. Gait not checked.  PSYCHIATRIC: The patient is alert and oriented x 3.  SKIN: No obvious rash, lesion, or ulcer.   DATA REVIEW:   CBC   Recent Labs Lab 07/08/14 0442  WBC 3.1*  HGB 8.7*  HCT 26.4*  PLT 154    Chemistries   Recent Labs Lab 07/06/14 1631 07/07/14 0440  NA 135 134*  K 4.1 3.9  CL 103 103  CO2 25 24  GLUCOSE 186* 129*  BUN 15 16  CREATININE 0.95 0.90  CALCIUM 7.7* 7.1*  AST 18  --   ALT 16*  --   ALKPHOS 97  --   BILITOT 1.0  --     Microbiology Results   Recent Results (from the past 240  hour(s))  Urine culture     Status: None   Collection Time: 07/06/14  6:30 PM  Result Value Ref Range Status   Specimen Description URINE, RANDOM  Final   Special Requests NONE  Final   Culture   Final    60,000 COLONIES/ml ESCHERICHIA COLI ESBL-EXTENDED SPECTRUM BETA LACTAMASE-THE ORGANISM IS RESISTANT TO PENICILLINS, CEPHALOSPORINS AND AZTREONAM ACCORDING TO CLSI M100-S15 VOL.[place]. MULTIPLE SPECIES PRESENT, SUGGEST RECOLLECTION IF CLINICALLY INDICATED CRITICAL RESULT CALLED TO, READ BACK BY AND VERIFIED WITH: DEIDRE MALCOM ON 07/08/14 AT 0915 BY JEF    Report Status 07/08/2014 FINAL  Final   Organism ID, Bacteria ESCHERICHIA COLI  Final      Susceptibility   Escherichia coli - MIC*    AMPICILLIN >=32 RESISTANT Resistant     CEFTAZIDIME 4 RESISTANT Resistant     CEFAZOLIN >=64 RESISTANT Resistant     CEFTRIAXONE >=64 RESISTANT Resistant     CIPROFLOXACIN >=4 RESISTANT Resistant     GENTAMICIN <=1 SENSITIVE Sensitive     IMIPENEM <=0.25 SENSITIVE Sensitive     TRIMETH/SULFA <=20 SENSITIVE Sensitive     CEFOXITIN <=4 SENSITIVE Sensitive     Extended ESBL POSITIVE Resistant     * 60,000 COLONIES/ml ESCHERICHIA COLI    RADIOLOGY:  Dg Chest 2 View  07/06/2014   CLINICAL DATA:  Prostate cancer, started recent chemotherapy treatment at [facility] last 2 Wednesday, fever  EXAM: CHEST  2 VIEW  COMPARISON:  02/15/2008  FINDINGS: Cardiomediastinal silhouette is stable. No acute infiltrate or pleural effusion. No pulmonary edema. Mild degenerative changes mid and lower thoracic spine.  IMPRESSION: No active cardiopulmonary disease.   Electronically Signed   By: Lahoma Crocker M.D.   On: 07/06/2014 17:40     Management plans discussed with the patient, family and they are in agreement.  CODE STATUS:     Code Status Orders        Start     Ordered   07/06/14 2136  Full code   Continuous     07/06/14 2135      TOTAL TIME TAKING CARE OF THIS PATIENT: 40 minutes.    Kura Bethards M.D  on 07/08/2014 at 10:12 AM  Between 7am to 6pm - Pager - [phone] After 6pm go to [email or web address] - password [email or web address] [facility]  Office  [phone]  CC: Primary care physician; Lelon Huh, MD

## 2014-07-08 NOTE — Discharge Instructions (Signed)
Monitor for any fever at home

## 2014-07-08 NOTE — Plan of Care (Signed)
Problem: Discharge Progression Outcomes Goal: Other Discharge Outcomes/Goals Outcome: Progressing Pt c/o nausea, relieved by zorfan. No pain over night. VSS. Plan for discharge in the am.

## 2014-07-11 LAB — CULTURE, BLOOD (ROUTINE X 2)
Culture: NO GROWTH
Culture: NO GROWTH

## 2014-07-15 DIAGNOSIS — J45909 Unspecified asthma, uncomplicated: Secondary | ICD-10-CM | POA: Insufficient documentation

## 2014-07-15 DIAGNOSIS — G4700 Insomnia, unspecified: Secondary | ICD-10-CM | POA: Insufficient documentation

## 2014-07-20 ENCOUNTER — Ambulatory Visit (INDEPENDENT_AMBULATORY_CARE_PROVIDER_SITE_OTHER): Payer: Medicare Other | Admitting: Family Medicine

## 2014-07-20 ENCOUNTER — Encounter: Payer: Self-pay | Admitting: Family Medicine

## 2014-07-20 VITALS — BP 114/60 | HR 72 | Temp 98.3°F | Resp 16 | Wt 143.0 lb

## 2014-07-20 DIAGNOSIS — R1084 Generalized abdominal pain: Secondary | ICD-10-CM

## 2014-07-20 DIAGNOSIS — C61 Malignant neoplasm of prostate: Secondary | ICD-10-CM | POA: Diagnosis not present

## 2014-07-20 DIAGNOSIS — D709 Neutropenia, unspecified: Secondary | ICD-10-CM

## 2014-07-20 DIAGNOSIS — R5081 Fever presenting with conditions classified elsewhere: Principal | ICD-10-CM

## 2014-07-20 MED ORDER — HYOSCYAMINE SULFATE 0.125 MG SL SUBL: 0.1250 mg | SUBLINGUAL_TABLET | SUBLINGUAL | Status: AC | PRN

## 2014-07-20 NOTE — Progress Notes (Signed)
Patient: Marcus Blevins Male    DOB: April 12, 1927   81 y.o.   MRN: [id number] Visit Date: 07/20/2014  Today's Provider: Lelon Huh, MD   Chief Complaint  Patient presents with  . Hospitalization Follow-up    DC7/01/2014   Subjective:    HPI     Follow up Hospitalization  Patient was admitted to [facility] on 07/06/2014 and discharged on 07/08/2014. He was treated for fever and neutropenia.  He reports good compliance with treatment of vancomycin and ceftazidime . He reports this condition is Improved. He started on chemotherapy last month at [facility], and goes for treatments every 3 weeks. He is scheduled for follow up tomorrow. Recent hospitalization was due to low grade fever after starting chemo, and he was sent there by his oncologist at [facility]. No clear source of infection was discovered, and he has had no fever since date of admission ------------------------------------------------------------------------------------  Nervous stomach  He states he frequently gets a "nervous" stomach, describing vague feeling of uneasiness and crampiness, particularly after eating certain foods. There is no diarrhea, and no blood in his stool, but his appetite has not been very good.    No Known Allergies Previous Medications   AMLODIPINE (NORVASC) 5 MG TABLET    Take 5 mg by mouth daily.   ASPIRIN EC 81 MG TABLET    Take 81 mg by mouth daily.   ATENOLOL (TENORMIN) 50 MG TABLET    Take 50 mg by mouth daily.   ATORVASTATIN (LIPITOR) 20 MG TABLET    Take 20 mg by mouth at bedtime.   CEFUROXIME (CEFTIN) 500 MG TABLET    TAKE 1 TABLET (500 MG TOTAL) BY MOUTH 2 (TWO) TIMES DAILY WITH A MEAL.   CLOTRIMAZOLE-BETAMETHASONE (LOTRISONE) CREAM    Apply 1 application topically 2 (two) times daily.   DEXAMETHASONE (DECADRON) 4 MG TABLET    Take 8 mg by mouth 2 (two) times daily. Pt only takes the day before and day after  DOCEtaxel.   DIPHENHYD-HYDROCORT-NYSTATIN (FIRST-DUKES MOUTHWASH MT)       DIPHENHYDRAMINE-ACETAMINOPHEN (TYLENOL PM) 25-500 MG TABS    Take 1 tablet by mouth at bedtime as needed (for sleep).   FLUTICASONE (FLONASE) 50 MCG/ACT NASAL SPRAY    Place 1 spray into both nostrils daily.   FOLIC ACID (FOLVITE) 518 MCG TABLET    Take 400 mcg by mouth daily.   LEUPROLIDE, 6 MONTH, (ELIGARD) 45 MG INJECTION    Inject 45 mg into the skin every 6 (six) months.   MELATONIN 5 MG TABS    Take 1 tablet by mouth at bedtime as needed (for sleep).    MOMETASONE (ASMANEX) 220 MCG/INH INHALER    Inhale 1 puff into the lungs 2 (two) times daily.   MONTELUKAST (SINGULAIR) 10 MG TABLET    TAKE 1 TABLET BY MOUTH DAILY AS NEEDED FOR SINUS DRAINAGE   MONTELUKAST (SINGULAIR) 10 MG TABLET    Take 10 mg by mouth daily.   NYSTATIN (MYCOSTATIN) 100000 UNIT/ML SUSPENSION    USE AS DIRECTED 5 MLS (500,000 UNITS TOTAL) IN THE MOUTH OR THROAT 4 (FOUR) TIMES DAILY.   PROCHLORPERAZINE (COMPAZINE) 10 MG TABLET    Take 10 mg by mouth every 6 (six) hours as needed for nausea or vomiting.   PYRIDOXINE (VITAMIN B-6) 100 MG TABLET    Take 100 mg by mouth daily.   SOLIFENACIN (VESICARE) 10 MG TABLET    Take 10 mg by mouth at bedtime.  SULFAMETHOXAZOLE-TRIMETHOPRIM (BACTRIM DS,SEPTRA DS) 800-160 MG PER TABLET    Take 1 tablet by mouth every 12 (twelve) hours.   TAMSULOSIN (FLOMAX) 0.4 MG CAPS CAPSULE    Take 0.4 mg by mouth daily.   VITAMIN B-12 (CYANOCOBALAMIN) 1000 MCG TABLET    Take 1,000 mcg by mouth daily.    Review of Systems  Constitutional: Positive for appetite change and fatigue. Negative for fever, chills and diaphoresis.  HENT: Negative for congestion, ear pain and facial swelling.   Eyes: Negative for pain and redness.  Respiratory: Negative for cough, chest tightness and shortness of breath.   Cardiovascular: Negative for chest pain, palpitations and leg swelling.  Gastrointestinal: Positive for nausea and abdominal pain. Negative for vomiting, diarrhea, constipation and blood in stool.    Genitourinary: Negative for dysuria, frequency, hematuria and flank pain.    History  Substance Use Topics  . Smoking status: Former Smoker -- 1.00 packs/day for 20 years    Quit date: 01/07/1954  . Smokeless tobacco: Never Used  . Alcohol Use: No   Objective:   BP 114/60 mmHg  Pulse 72  Temp(Src) 98.3 F (36.8 C) (Oral)  Resp 16  Wt 143 lb (64.864 kg)  SpO2 97%  Physical Exam  General Appearance:    Alert, cooperative, no distress  Eyes:    PERRL, conjunctiva/corneas clear, EOM's intact       Lungs:     Clear to auscultation bilaterally, respirations unlabored  Heart:    Regular rate and rhythm  Neurologic:   Awake, alert, oriented x 3. No apparent focal neurological           defect.   Abdomen:   No masses or tenderness.         Assessment & Plan:      1. Neutropenic fever No fever since hospitalization. Follow up oncology tomorrow as previously scheduled.   2. Prostate cancer   3. Stomach cramps, generalized  - hyoscyamine (LEVSIN SL) 0.125 MG SL tablet; Place 1 tablet (0.125 mg total) under the tongue every 4 (four) hours as needed.  Dispense: 30 tablet; Refill: 0   Lelon Huh, MD  [facility]

## 2014-08-03 ENCOUNTER — Other Ambulatory Visit: Payer: Self-pay | Admitting: Family Medicine

## 2014-08-03 MED ORDER — ALBUTEROL SULFATE HFA 108 (90 BASE) MCG/ACT IN AERS: 2.0000 | INHALATION_SPRAY | Freq: Four times a day (QID) | RESPIRATORY_TRACT | Status: AC | PRN

## 2014-08-19 ENCOUNTER — Emergency Department: Payer: Medicare Other

## 2014-08-19 ENCOUNTER — Encounter: Payer: Self-pay | Admitting: Emergency Medicine

## 2014-08-19 ENCOUNTER — Inpatient Hospital Stay
Admission: EM | Admit: 2014-08-19 | Discharge: 2014-09-08 | DRG: 870 | Disposition: E | Payer: Medicare Other | Attending: Specialist | Admitting: Specialist

## 2014-08-19 DIAGNOSIS — Z7951 Long term (current) use of inhaled steroids: Secondary | ICD-10-CM | POA: Diagnosis not present

## 2014-08-19 DIAGNOSIS — Z6822 Body mass index (BMI) 22.0-22.9, adult: Secondary | ICD-10-CM

## 2014-08-19 DIAGNOSIS — E871 Hypo-osmolality and hyponatremia: Secondary | ICD-10-CM | POA: Diagnosis present

## 2014-08-19 DIAGNOSIS — N170 Acute kidney failure with tubular necrosis: Secondary | ICD-10-CM | POA: Diagnosis present

## 2014-08-19 DIAGNOSIS — Z823 Family history of stroke: Secondary | ICD-10-CM | POA: Diagnosis not present

## 2014-08-19 DIAGNOSIS — Z8249 Family history of ischemic heart disease and other diseases of the circulatory system: Secondary | ICD-10-CM | POA: Diagnosis not present

## 2014-08-19 DIAGNOSIS — R6520 Severe sepsis without septic shock: Secondary | ICD-10-CM | POA: Diagnosis not present

## 2014-08-19 DIAGNOSIS — E872 Acidosis: Secondary | ICD-10-CM | POA: Diagnosis present

## 2014-08-19 DIAGNOSIS — N183 Chronic kidney disease, stage 3 (moderate): Secondary | ICD-10-CM | POA: Diagnosis present

## 2014-08-19 DIAGNOSIS — I5021 Acute systolic (congestive) heart failure: Secondary | ICD-10-CM | POA: Diagnosis present

## 2014-08-19 DIAGNOSIS — Z4659 Encounter for fitting and adjustment of other gastrointestinal appliance and device: Secondary | ICD-10-CM

## 2014-08-19 DIAGNOSIS — Z79899 Other long term (current) drug therapy: Secondary | ICD-10-CM | POA: Diagnosis not present

## 2014-08-19 DIAGNOSIS — Z7982 Long term (current) use of aspirin: Secondary | ICD-10-CM

## 2014-08-19 DIAGNOSIS — J189 Pneumonia, unspecified organism: Secondary | ICD-10-CM | POA: Diagnosis present

## 2014-08-19 DIAGNOSIS — D649 Anemia, unspecified: Secondary | ICD-10-CM | POA: Diagnosis present

## 2014-08-19 DIAGNOSIS — Z87891 Personal history of nicotine dependence: Secondary | ICD-10-CM | POA: Diagnosis not present

## 2014-08-19 DIAGNOSIS — I4892 Unspecified atrial flutter: Secondary | ICD-10-CM | POA: Diagnosis present

## 2014-08-19 DIAGNOSIS — J9690 Respiratory failure, unspecified, unspecified whether with hypoxia or hypercapnia: Secondary | ICD-10-CM

## 2014-08-19 DIAGNOSIS — K559 Vascular disorder of intestine, unspecified: Secondary | ICD-10-CM | POA: Diagnosis present

## 2014-08-19 DIAGNOSIS — I4891 Unspecified atrial fibrillation: Secondary | ICD-10-CM | POA: Diagnosis present

## 2014-08-19 DIAGNOSIS — Z923 Personal history of irradiation: Secondary | ICD-10-CM | POA: Diagnosis not present

## 2014-08-19 DIAGNOSIS — I129 Hypertensive chronic kidney disease with stage 1 through stage 4 chronic kidney disease, or unspecified chronic kidney disease: Secondary | ICD-10-CM | POA: Diagnosis present

## 2014-08-19 DIAGNOSIS — N390 Urinary tract infection, site not specified: Secondary | ICD-10-CM | POA: Diagnosis present

## 2014-08-19 DIAGNOSIS — G4700 Insomnia, unspecified: Secondary | ICD-10-CM | POA: Diagnosis present

## 2014-08-19 DIAGNOSIS — E785 Hyperlipidemia, unspecified: Secondary | ICD-10-CM | POA: Diagnosis present

## 2014-08-19 DIAGNOSIS — I2510 Atherosclerotic heart disease of native coronary artery without angina pectoris: Secondary | ICD-10-CM | POA: Diagnosis present

## 2014-08-19 DIAGNOSIS — Z9049 Acquired absence of other specified parts of digestive tract: Secondary | ICD-10-CM | POA: Diagnosis present

## 2014-08-19 DIAGNOSIS — Z9889 Other specified postprocedural states: Secondary | ICD-10-CM | POA: Diagnosis not present

## 2014-08-19 DIAGNOSIS — E119 Type 2 diabetes mellitus without complications: Secondary | ICD-10-CM | POA: Diagnosis present

## 2014-08-19 DIAGNOSIS — E876 Hypokalemia: Secondary | ICD-10-CM | POA: Diagnosis present

## 2014-08-19 DIAGNOSIS — R34 Anuria and oliguria: Secondary | ICD-10-CM | POA: Diagnosis present

## 2014-08-19 DIAGNOSIS — Z9221 Personal history of antineoplastic chemotherapy: Secondary | ICD-10-CM

## 2014-08-19 DIAGNOSIS — A419 Sepsis, unspecified organism: Secondary | ICD-10-CM | POA: Diagnosis present

## 2014-08-19 DIAGNOSIS — J441 Chronic obstructive pulmonary disease with (acute) exacerbation: Secondary | ICD-10-CM | POA: Diagnosis present

## 2014-08-19 DIAGNOSIS — Z515 Encounter for palliative care: Secondary | ICD-10-CM | POA: Diagnosis not present

## 2014-08-19 DIAGNOSIS — A4151 Sepsis due to Escherichia coli [E. coli]: Principal | ICD-10-CM | POA: Diagnosis present

## 2014-08-19 DIAGNOSIS — N179 Acute kidney failure, unspecified: Secondary | ICD-10-CM

## 2014-08-19 DIAGNOSIS — Z8551 Personal history of malignant neoplasm of bladder: Secondary | ICD-10-CM

## 2014-08-19 DIAGNOSIS — Z452 Encounter for adjustment and management of vascular access device: Secondary | ICD-10-CM

## 2014-08-19 DIAGNOSIS — E43 Unspecified severe protein-calorie malnutrition: Secondary | ICD-10-CM | POA: Diagnosis present

## 2014-08-19 DIAGNOSIS — C7951 Secondary malignant neoplasm of bone: Secondary | ICD-10-CM | POA: Diagnosis present

## 2014-08-19 DIAGNOSIS — E860 Dehydration: Secondary | ICD-10-CM | POA: Diagnosis present

## 2014-08-19 DIAGNOSIS — J9600 Acute respiratory failure, unspecified whether with hypoxia or hypercapnia: Secondary | ICD-10-CM | POA: Insufficient documentation

## 2014-08-19 DIAGNOSIS — J181 Lobar pneumonia, unspecified organism: Secondary | ICD-10-CM

## 2014-08-19 DIAGNOSIS — Z66 Do not resuscitate: Secondary | ICD-10-CM | POA: Diagnosis not present

## 2014-08-19 DIAGNOSIS — I255 Ischemic cardiomyopathy: Secondary | ICD-10-CM | POA: Diagnosis present

## 2014-08-19 DIAGNOSIS — J9601 Acute respiratory failure with hypoxia: Secondary | ICD-10-CM | POA: Diagnosis present

## 2014-08-19 DIAGNOSIS — R6521 Severe sepsis with septic shock: Secondary | ICD-10-CM | POA: Diagnosis present

## 2014-08-19 DIAGNOSIS — C61 Malignant neoplasm of prostate: Secondary | ICD-10-CM | POA: Diagnosis present

## 2014-08-19 DIAGNOSIS — I214 Non-ST elevation (NSTEMI) myocardial infarction: Secondary | ICD-10-CM | POA: Diagnosis present

## 2014-08-19 DIAGNOSIS — Z0189 Encounter for other specified special examinations: Secondary | ICD-10-CM

## 2014-08-19 DIAGNOSIS — Z9911 Dependence on respirator [ventilator] status: Secondary | ICD-10-CM

## 2014-08-19 DIAGNOSIS — I959 Hypotension, unspecified: Secondary | ICD-10-CM | POA: Diagnosis not present

## 2014-08-19 LAB — CBC WITH DIFFERENTIAL/PLATELET
Band Neutrophils: 3 % (ref 0–10)
Basophils Absolute: 0 10*3/uL (ref 0.0–0.1)
Basophils Relative: 0 % (ref 0–1)
Blasts: 0 %
Eosinophils Absolute: 0 10*3/uL (ref 0.0–0.7)
Eosinophils Relative: 0 % (ref 0–5)
HCT: 29.3 % — ABNORMAL LOW (ref 40.0–52.0)
HEMOGLOBIN: 9.3 g/dL — AB (ref 13.0–18.0)
Lymphocytes Relative: 1 % — ABNORMAL LOW (ref 12–46)
Lymphs Abs: 0.3 10*3/uL — ABNORMAL LOW (ref 0.7–4.0)
MCH: 26.4 pg (ref 26.0–34.0)
MCHC: 31.8 g/dL — ABNORMAL LOW (ref 32.0–36.0)
MCV: 83.2 fL (ref 80.0–100.0)
METAMYELOCYTES PCT: 0 %
MYELOCYTES: 1 %
Monocytes Absolute: 0 10*3/uL — ABNORMAL LOW (ref 0.1–1.0)
Monocytes Relative: 0 % — ABNORMAL LOW (ref 3–12)
NEUTROS PCT: 95 % — AB (ref 43–77)
NRBC: 0 /100{WBCs}
Neutro Abs: 32.6 10*3/uL — ABNORMAL HIGH (ref 1.7–7.7)
OTHER: 0 %
PROMYELOCYTES ABS: 0 %
Platelets: 196 10*3/uL (ref 150–440)
RBC: 3.52 MIL/uL — AB (ref 4.40–5.90)
RDW: 19.4 % — ABNORMAL HIGH (ref 11.5–14.5)
WBC: 32.9 10*3/uL — ABNORMAL HIGH (ref 3.8–10.6)

## 2014-08-19 LAB — COMPREHENSIVE METABOLIC PANEL
ALT: 14 U/L — ABNORMAL LOW (ref 17–63)
ANION GAP: 10 (ref 5–15)
AST: 30 U/L (ref 15–41)
Albumin: 3 g/dL — ABNORMAL LOW (ref 3.5–5.0)
Alkaline Phosphatase: 118 U/L (ref 38–126)
BUN: 18 mg/dL (ref 6–20)
CALCIUM: 7.5 mg/dL — AB (ref 8.9–10.3)
CO2: 18 mmol/L — ABNORMAL LOW (ref 22–32)
Chloride: 107 mmol/L (ref 101–111)
Creatinine, Ser: 0.99 mg/dL (ref 0.61–1.24)
GFR calc non Af Amer: >60 mL/min (ref >60)
Glucose, Bld: 135 mg/dL — ABNORMAL HIGH (ref 65–99)
Potassium: 3.2 mmol/L — ABNORMAL LOW (ref 3.5–5.1)
SODIUM: 135 mmol/L (ref 135–145)
TOTAL PROTEIN: 5.8 g/dL — AB (ref 6.5–8.1)
Total Bilirubin: 0.4 mg/dL (ref 0.3–1.2)

## 2014-08-19 LAB — BLOOD GAS, ARTERIAL
Acid-base deficit: 8.8 mmol/L — ABNORMAL HIGH (ref 0.0–2.0)
Allens test (pass/fail): POSITIVE — AB
Bicarbonate: 15.8 mEq/L — ABNORMAL LOW (ref 21.0–28.0)
FIO2: 0.6
Mechanical Rate: 14
O2 SAT: 95.8 %
PCO2 ART: 30 mmHg — AB (ref 32.0–48.0)
PEEP: 5 cmH2O
PH ART: 7.33 — AB (ref 7.350–7.450)
PO2 ART: 86 mmHg (ref 83.0–108.0)
Patient temperature: 37
VT: 450 mL

## 2014-08-19 LAB — URINALYSIS COMPLETE WITH MICROSCOPIC (ARMC ONLY)
BILIRUBIN URINE: NEGATIVE
GLUCOSE, UA: NEGATIVE mg/dL
Hgb urine dipstick: NEGATIVE
KETONES UR: NEGATIVE mg/dL
Leukocytes, UA: NEGATIVE
Nitrite: NEGATIVE
Protein, ur: NEGATIVE mg/dL
Specific Gravity, Urine: 1.025 (ref 1.005–1.030)
pH: 5 (ref 5.0–8.0)

## 2014-08-19 LAB — TROPONIN I: TROPONIN I: 0.08 ng/mL — AB (ref <0.031)

## 2014-08-19 LAB — GLUCOSE, CAPILLARY
GLUCOSE-CAPILLARY: 143 mg/dL — AB (ref 65–99)
GLUCOSE-CAPILLARY: 171 mg/dL — AB (ref 65–99)

## 2014-08-19 LAB — LACTIC ACID, PLASMA: LACTIC ACID, VENOUS: 4 mmol/L — AB (ref 0.5–2.0)

## 2014-08-19 MED ORDER — ATENOLOL 50 MG PO TABS: 50.0000 mg | ORAL_TABLET | Freq: Every day | ORAL | Status: DC

## 2014-08-19 MED ORDER — PIPERACILLIN-TAZOBACTAM 4.5 G IVPB
4.5000 g | Freq: Three times a day (TID) | INTRAVENOUS | Status: DC
Start: 2014-08-19 — End: 2014-08-19

## 2014-08-19 MED ORDER — MONTELUKAST SODIUM 10 MG PO TABS
10.0000 mg | ORAL_TABLET | Freq: Every day | ORAL | Status: DC
  Administered 2014-08-20 – 2014-08-21 (×2): 10 mg via ORAL
  Filled 2014-08-19 (×2): qty 1

## 2014-08-19 MED ORDER — MIDAZOLAM HCL 2 MG/2ML IJ SOLN
1.0000 mg | Freq: Once | INTRAMUSCULAR | Status: AC
  Administered 2014-08-19: 1 mg via INTRAVENOUS

## 2014-08-19 MED ORDER — ASPIRIN EC 81 MG PO TBEC
81.0000 mg | DELAYED_RELEASE_TABLET | Freq: Every day | ORAL | Status: DC
  Administered 2014-08-20 – 2014-08-21 (×2): 81 mg via ORAL
  Filled 2014-08-19 (×3): qty 1

## 2014-08-19 MED ORDER — SODIUM CHLORIDE 0.9 % IV SOLN: INTRAVENOUS | Status: DC

## 2014-08-19 MED ORDER — MOMETASONE FUROATE 220 MCG/INH IN AEPB: 1.0000 | INHALATION_SPRAY | Freq: Two times a day (BID) | RESPIRATORY_TRACT | Status: DC

## 2014-08-19 MED ORDER — SODIUM CHLORIDE 0.9 % IV SOLN
2.0000 mg/h | INTRAVENOUS | Status: DC
  Administered 2014-08-19: 1 mg/h via INTRAVENOUS
  Filled 2014-08-19: qty 10

## 2014-08-19 MED ORDER — SODIUM CHLORIDE 0.9 % IV BOLUS (SEPSIS)
1000.0000 mL | Freq: Once | INTRAVENOUS | Status: AC
  Administered 2014-08-19: 1000 mL via INTRAVENOUS

## 2014-08-19 MED ORDER — AMLODIPINE BESYLATE 5 MG PO TABS: 5.0000 mg | ORAL_TABLET | Freq: Every day | ORAL | Status: DC

## 2014-08-19 MED ORDER — VANCOMYCIN HCL IN DEXTROSE 1-5 GM/200ML-% IV SOLN
1000.0000 mg | Freq: Once | INTRAVENOUS | Status: AC
  Administered 2014-08-19: 1000 mg via INTRAVENOUS
  Filled 2014-08-19: qty 200

## 2014-08-19 MED ORDER — ONDANSETRON HCL 4 MG PO TABS: 4.0000 mg | ORAL_TABLET | Freq: Four times a day (QID) | ORAL | Status: DC | PRN

## 2014-08-19 MED ORDER — TEMAZEPAM 7.5 MG PO CAPS: 7.5000 mg | ORAL_CAPSULE | Freq: Every evening | ORAL | Status: DC | PRN

## 2014-08-19 MED ORDER — HEPARIN SODIUM (PORCINE) 5000 UNIT/ML IJ SOLN
5000.0000 [IU] | Freq: Three times a day (TID) | INTRAMUSCULAR | Status: DC
  Administered 2014-08-19: 5000 [IU] via SUBCUTANEOUS
  Filled 2014-08-19: qty 1

## 2014-08-19 MED ORDER — FENTANYL CITRATE (PF) 100 MCG/2ML IJ SOLN
50.0000 ug | Freq: Once | INTRAMUSCULAR | Status: AC
  Administered 2014-08-19: 50 ug via INTRAVENOUS

## 2014-08-19 MED ORDER — MIDAZOLAM HCL 2 MG/2ML IJ SOLN
2.0000 mg | Freq: Once | INTRAMUSCULAR | Status: AC
  Administered 2014-08-19: 2 mg via INTRAVENOUS

## 2014-08-19 MED ORDER — SUCCINYLCHOLINE CHLORIDE 20 MG/ML IJ SOLN
INTRAMUSCULAR | Status: AC | PRN
  Administered 2014-08-19: 120 mg via INTRAVENOUS

## 2014-08-19 MED ORDER — ACETAMINOPHEN 325 MG PO TABS
650.0000 mg | ORAL_TABLET | Freq: Four times a day (QID) | ORAL | Status: DC | PRN
  Administered 2014-08-22: 650 mg via ORAL
  Filled 2014-08-19: qty 2

## 2014-08-19 MED ORDER — SODIUM CHLORIDE 0.9 % IJ SOLN
3.0000 mL | Freq: Two times a day (BID) | INTRAMUSCULAR | Status: DC
  Administered 2014-08-19 – 2014-08-23 (×9): 3 mL via INTRAVENOUS
  Administered 2014-08-24: 10 mL via INTRAVENOUS
  Administered 2014-08-24 – 2014-08-25 (×2): 3 mL via INTRAVENOUS

## 2014-08-19 MED ORDER — ATORVASTATIN CALCIUM 20 MG PO TABS
20.0000 mg | ORAL_TABLET | Freq: Every day | ORAL | Status: DC
  Administered 2014-08-20 – 2014-08-24 (×5): 20 mg via ORAL
  Filled 2014-08-19 (×5): qty 1

## 2014-08-19 MED ORDER — ACETAMINOPHEN 650 MG RE SUPP: 650.0000 mg | Freq: Four times a day (QID) | RECTAL | Status: DC | PRN

## 2014-08-19 MED ORDER — OXYCODONE HCL 5 MG PO TABS: 5.0000 mg | ORAL_TABLET | ORAL | Status: DC | PRN

## 2014-08-19 MED ORDER — SODIUM CHLORIDE 0.9 % IV SOLN
INTRAVENOUS | Status: DC
  Administered 2014-08-19: 125 mL/h via INTRAVENOUS
  Administered 2014-08-20: 08:00:00 via INTRAVENOUS

## 2014-08-19 MED ORDER — IPRATROPIUM-ALBUTEROL 0.5-2.5 (3) MG/3ML IN SOLN
3.0000 mL | RESPIRATORY_TRACT | Status: DC
  Administered 2014-08-20 – 2014-08-25 (×33): 3 mL via RESPIRATORY_TRACT
  Filled 2014-08-19 (×33): qty 3

## 2014-08-19 MED ORDER — MORPHINE SULFATE 2 MG/ML IJ SOLN: 2.0000 mg | INTRAMUSCULAR | Status: DC | PRN

## 2014-08-19 MED ORDER — FENTANYL 2500MCG IN NS 250ML (10MCG/ML) PREMIX INFUSION
INTRAVENOUS | Status: AC
  Administered 2014-08-19: 20 ug/h via INTRAVENOUS
  Filled 2014-08-19: qty 250

## 2014-08-19 MED ORDER — BUDESONIDE 0.5 MG/2ML IN SUSP
0.5000 mg | Freq: Two times a day (BID) | RESPIRATORY_TRACT | Status: DC
  Administered 2014-08-20 – 2014-08-25 (×11): 0.5 mg via RESPIRATORY_TRACT
  Filled 2014-08-19 (×10): qty 2

## 2014-08-19 MED ORDER — DARIFENACIN HYDROBROMIDE ER 7.5 MG PO TB24
7.5000 mg | ORAL_TABLET | Freq: Every day | ORAL | Status: DC
  Administered 2014-08-20 – 2014-08-22 (×3): 7.5 mg via ORAL
  Filled 2014-08-19 (×5): qty 1

## 2014-08-19 MED ORDER — ONDANSETRON HCL 4 MG/2ML IJ SOLN: 4.0000 mg | Freq: Four times a day (QID) | INTRAMUSCULAR | Status: DC | PRN

## 2014-08-19 MED ORDER — PIPERACILLIN-TAZOBACTAM 3.375 G IVPB 30 MIN
3.3750 g | Freq: Once | INTRAVENOUS | Status: AC
  Administered 2014-08-19: 3.375 g via INTRAVENOUS

## 2014-08-19 MED ORDER — TAMSULOSIN HCL 0.4 MG PO CAPS
0.4000 mg | ORAL_CAPSULE | Freq: Every day | ORAL | Status: DC
  Administered 2014-08-20 – 2014-08-22 (×3): 0.4 mg via ORAL
  Filled 2014-08-19 (×4): qty 1

## 2014-08-19 MED ORDER — FENTANYL 2500MCG IN NS 250ML (10MCG/ML) PREMIX INFUSION
20.0000 ug/h | INTRAVENOUS | Status: DC
  Administered 2014-08-19: 20 ug/h via INTRAVENOUS
  Administered 2014-08-20: 80 ug/h via INTRAVENOUS

## 2014-08-19 MED ORDER — PIPERACILLIN-TAZOBACTAM 4.5 G IVPB
4.5000 g | Freq: Three times a day (TID) | INTRAVENOUS | Status: DC
  Administered 2014-08-20 – 2014-08-21 (×4): 4.5 g via INTRAVENOUS
  Filled 2014-08-19 (×8): qty 100

## 2014-08-19 MED ORDER — ETOMIDATE 2 MG/ML IV SOLN
INTRAVENOUS | Status: AC | PRN
  Administered 2014-08-19: 10 mg via INTRAVENOUS

## 2014-08-19 MED ORDER — PIPERACILLIN SOD-TAZOBACTAM SO 3.375 (3-0.375) G IV SOLR
INTRAVENOUS | Status: AC
  Administered 2014-08-19: 19:00:00
  Filled 2014-08-19: qty 3.38

## 2014-08-19 MED ORDER — VANCOMYCIN HCL IN DEXTROSE 1-5 GM/200ML-% IV SOLN
1000.0000 mg | INTRAVENOUS | Status: DC
  Administered 2014-08-20 (×2): 1000 mg via INTRAVENOUS
  Filled 2014-08-19 (×5): qty 200

## 2014-08-19 NOTE — ED Notes (Signed)
First call for report to CCU, Libby to call back in 5 min

## 2014-08-19 NOTE — Sedation Documentation (Signed)
Xray confirmation ATT

## 2014-08-19 NOTE — Progress Notes (Addendum)
ANTIBIOTIC CONSULT NOTE - INITIAL  Pharmacy Consult for Vancomycin/Zosyn Indication: severe sepsis  No Known Allergies  Patient Measurements: Height: 5\' 7"  (170.2 cm) Weight: 144 lb (65.318 kg) IBW/kg (Calculated) : 66.1 Adjusted Body Weight: 65.3 kg  Vital Signs: Temp: 98.5 F (36.9 C) (08/12 1758) Temp Source: Oral (08/12 1758) BP: 102/61 mmHg (08/12 2042) Pulse Rate: 133 (08/12 2042) Intake/Output from previous day:   Intake/Output from this shift: Total I/O In: -  Out: 60 [Urine:60]  Labs:  Recent Labs  08/27/2014 2025  WBC 32.9*  HGB 9.3*  PLT 196  CREATININE 0.99   Estimated Creatinine Clearance: 50.4 mL/min (by C-G formula based on Cr of 0.99). No results for input(s): VANCOTROUGH, VANCOPEAK, VANCORANDOM, GENTTROUGH, GENTPEAK, GENTRANDOM, TOBRATROUGH, TOBRAPEAK, TOBRARND, AMIKACINPEAK, AMIKACINTROU, AMIKACIN in the last 72 hours.   Microbiology: No results found for this or any previous visit (from the past 720 hour(s)).  Medical History: Past Medical History  Diagnosis Date  . Cancer     prostate  . Heart disease   . Prostate cancer 2014    8 weeks of radiation, [facility]  . Hypertension   . Hyperlipidemia     Medications:   (Not in a hospital admission) Assessment: CrCl = 50.4 ml/min Ke = 0.05 hr-1 T1/2 = 13.9 hrs Vd = 45.7 L   Goal of Therapy:  Vancomycin trough level 15-20 mcg/ml  Plan:  Expected duration 7 days with resolution of temperature and/or normalization of WBC   Zosyn 3.375 gm IV X 1 given in ED on 8/12 @ 19:30. Zosyn 4.5 gm IV Q8H EI ordered to start 8/13 @ 4:00.   Vancomycin 1 gm IV X 1 given on 8/12 @ 20:30.  Vancomycin 1 gm IV Q18H ordered to start 8/13 @ 5:00. This pt will reach Css by 8/15 @ 21:00. Will draw 1st trough on 8/16 @ 4:30, which will be at Css.   Lakethia Coppess D 09/03/2014,10:13 PM

## 2014-08-19 NOTE — Progress Notes (Signed)
7.5 ET tube at 21cm at the Lip

## 2014-08-19 NOTE — H&P (Signed)
[facility] [place] NAME: Marcus Blevins    MR#:  [id number]  DATE OF BIRTH:  05/15/1927   DATE OF ADMISSION:  08/27/2014  PRIMARY CARE PHYSICIAN: Lelon Huh, MD   REQUESTING/REFERRING PHYSICIAN: Lisa Roca  CHIEF COMPLAINT:   Chief Complaint  Patient presents with  . Fever  . Chills  . Cough    HISTORY OF PRESENT ILLNESS:  Marcus Blevins  is a 81 y.o. male with a known history of prostate cancer on active chemotherapy presenting with fever and cough. Patient's condition declined in emergency department subsequently intubated now intubated/sedated unable to provide any meaningful information. History obtained from family members present at bedside. states that he has been having a cough for about 3 days total duration nonproductive start Levaquin for sinusitis. Now one day duration of fever and shortness of breath. Prompting presentation to the hospital. Emergency department course: Code sepsis initiated, rapid sequence intubation  PAST MEDICAL HISTORY:   Past Medical History  Diagnosis Date  . Cancer     prostate  . Heart disease   . Prostate cancer 2014    8 weeks of radiation, [facility]  . Hypertension   . Hyperlipidemia     PAST SURGICAL HISTORY:   Past Surgical History  Procedure Laterality Date  . Colonoscopy  2007  . Cholecystectomy  1958  . Bladder scraping  03-15-14    [facility], for bladder cancer  . Tumor removed from bladder      SOCIAL HISTORY:   Social History  Substance Use Topics  . Smoking status: Former Smoker -- 1.00 packs/day for 20 years    Quit date: 01/07/1954  . Smokeless tobacco: Never Used  . Alcohol Use: No    FAMILY HISTORY:   Family History  Problem Relation Age of Onset  . CVA Mother   . Heart attack Father     DRUG ALLERGIES:  No Known Allergies  REVIEW OF SYSTEMS:  Unable to obtain given patient's mental status/medical condition   MEDICATIONS AT HOME:   Prior to  Admission medications   Medication Sig Start Date End Date Taking? Authorizing Provider  albuterol (PROVENTIL HFA;VENTOLIN HFA) 108 (90 BASE) MCG/ACT inhaler Inhale 2 puffs into the lungs every 6 (six) hours as needed for wheezing. 08/03/14  Yes Birdie Sons, MD  amLODipine (NORVASC) 5 MG tablet Take 5 mg by mouth daily.   Yes Historical Provider, MD  aspirin EC 81 MG tablet Take 81 mg by mouth daily.   Yes Historical Provider, MD  atenolol (TENORMIN) 50 MG tablet Take 50 mg by mouth daily.   Yes Historical Provider, MD  atorvastatin (LIPITOR) 20 MG tablet Take 20 mg by mouth at bedtime.   Yes Historical Provider, MD  Calcium Carbonate-Vitamin D (CALCIUM 600+D) 600-400 MG-UNIT per tablet Take 1 tablet by mouth daily.   Yes Historical Provider, MD  clotrimazole-betamethasone (LOTRISONE) cream Apply 1 application topically 2 (two) times daily.   Yes Historical Provider, MD  dexamethasone (DECADRON) 4 MG tablet Take 8 mg by mouth 2 (two) times daily. Pt only takes the day before and day after DOCEtaxel.   Yes Historical Provider, MD  Diphenhyd-Hydrocort-Nystatin (FIRST-DUKES MOUTHWASH) SUSP Use as directed 30 mLs in the mouth or throat as needed (for mouth sores).   Yes Historical Provider, MD  diphenhydramine-acetaminophen (TYLENOL PM) 25-500 MG TABS Take 1 tablet by mouth at bedtime as needed (for sleep).   Yes Historical Provider, MD  fluticasone (FLONASE) 50  MCG/ACT nasal spray Place 1 spray into both nostrils daily.   Yes Historical Provider, MD  folic acid (FOLVITE) 607 MCG tablet Take 800 mcg by mouth daily.   Yes Historical Provider, MD  leuprolide, 6 Month, (ELIGARD) 45 MG injection Inject 45 mg into the skin every 6 (six) months.   Yes Historical Provider, MD  Melatonin 5 MG TABS Take 1 tablet by mouth at bedtime as needed (for sleep).    Yes Historical Provider, MD  mometasone (ASMANEX) 220 MCG/INH inhaler Inhale 1 puff into the lungs 2 (two) times daily.   Yes Historical Provider, MD   montelukast (SINGULAIR) 10 MG tablet Take 10 mg by mouth at bedtime.   Yes Historical Provider, MD  prochlorperazine (COMPAZINE) 10 MG tablet Take 10 mg by mouth every 6 (six) hours as needed for nausea or vomiting.   Yes Historical Provider, MD  pyridOXINE (VITAMIN B-6) 100 MG tablet Take 100 mg by mouth daily.   Yes Historical Provider, MD  solifenacin (VESICARE) 10 MG tablet Take 10 mg by mouth at bedtime.   Yes Historical Provider, MD  tamsulosin (FLOMAX) 0.4 MG CAPS capsule Take 0.4 mg by mouth daily.   Yes Historical Provider, MD  temazepam (RESTORIL) 7.5 MG capsule Take 7.5 mg by mouth at bedtime as needed for sleep.   Yes Historical Provider, MD  vitamin B-12 (CYANOCOBALAMIN) 1000 MCG tablet Take 1,000 mcg by mouth daily.   Yes Historical Provider, MD  hyoscyamine (LEVSIN SL) 0.125 MG SL tablet Place 1 tablet (0.125 mg total) under the tongue every 4 (four) hours as needed. Patient not taking: Reported on 08/21/2014 07/20/14   Birdie Sons, MD      VITAL SIGNS:  Blood pressure 102/61, pulse 133, temperature 98.5 F (36.9 C), temperature source Oral, resp. rate 22, height 5\' 7"  (1.702 m), weight 144 lb (65.318 kg), SpO2 95 %.  PHYSICAL EXAMINATION:   VITAL SIGNS: Filed Vitals:   09/07/2014 2042  BP: 102/61  Pulse: 133  Temp:   Resp: 22   GENERAL:81 y.o.male moderate distress given mental status.  HEAD: Normocephalic, atraumatic.  EYES: Pupils equal, round, reactive to light. Unable to assess extraocular muscles given mental status/medical condition. No scleral icterus.  MOUTH: Moist mucosal membrane. Dentition intact. No abscess noted.  EAR, NOSE, THROAT: Clear without exudates. No external lesions.  NECK: Supple. No thyromegaly. No nodules. No JVD.  PULMONARY: Coarse breath sounds with scattered rhonchi right lung field, intubated/mechanical ventilation CHEST: Nontender to palpation.  CARDIOVASCULAR: S1 and S2. Tachycardic No murmurs, rubs, or gallops. No edema. Pedal  pulses 2+ bilaterally.  GASTROINTESTINAL: Soft, nontender, nondistended. No masses. Positive bowel sounds. No hepatosplenomegaly.  MUSCULOSKELETAL: No swelling, clubbing, or edema. Range of motion full in all extremities.  NEUROLOGIC: Unable to assess given mental status/medical condition SKIN: No ulceration, lesions, rashes, or cyanosis. Skin warm and dry. Turgor intact.  PSYCHIATRIC: Unable to assess given mental status/medical condition    LABORATORY PANEL:   CBC  Recent Labs Lab 08/30/2014 2025  WBC 32.9*  HGB 9.3*  HCT 29.3*  PLT 196   ------------------------------------------------------------------------------------------------------------------  Chemistries   Recent Labs Lab 08/16/2014 2025  NA 135  K 3.2*  CL 107  CO2 18*  GLUCOSE 135*  BUN 18  CREATININE 0.99  CALCIUM 7.5*  AST 30  ALT 14*  ALKPHOS 118  BILITOT 0.4   ------------------------------------------------------------------------------------------------------------------  Cardiac Enzymes  Recent Labs Lab 08/20/2014 2025  TROPONINI 0.08*   ------------------------------------------------------------------------------------------------------------------  RADIOLOGY:  Dg Chest 2  View  09/07/2014   CLINICAL DATA:  Fever, chills, cough. History of prostate cancer, receiving chemotherapy at [facility]. On Levaquin for sinus infection.  EXAM: CHEST  2 VIEW  COMPARISON:  07/06/2014.  FINDINGS: Right middle lobe opacity, overlying the heart on the lateral view, suspicious for pneumonia. Left basilar opacity, atelectasis versus pneumonia. No pleural effusion or pneumothorax.  The heart is normal in size.  Degenerative changes of the visualized thoracolumbar spine.  IMPRESSION: Right middle lobe pneumonia.  Left lower lobe opacity, atelectasis versus pneumonia.   Electronically Signed   By: Julian Hy M.D.   On: 09/01/2014 18:58   Dg Chest Port 1 View  09/05/2014   CLINICAL DATA:  Endotracheal tube  placement.  Initial encounter.  EXAM: PORTABLE CHEST - 1 VIEW  COMPARISON:  Chest radiograph performed earlier today at 6:07 p.m.  FINDINGS: The patient's endotracheal tube is seen ending 5 cm above the carina.  The lungs are well-aerated. Bilateral central and bibasilar airspace opacities raise concern for mild pulmonary edema, though pneumonia could have a similar appearance. Underlying vascular congestion is noted. No definite pleural effusion or pneumothorax is seen. The left costophrenic angle is incompletely imaged on this study.  The cardiomediastinal silhouette is borderline normal in size. No acute osseous abnormalities are seen.  IMPRESSION: 1. Endotracheal tube seen ending 5 cm above the carina. 2. Bilateral central and bibasilar airspace opacities, new from the prior study, raise concern for pulmonary edema, though pneumonia could have a similar appearance. Underlying vascular congestion seen.   Electronically Signed   By: Garald Balding M.D.   On: 09/02/2014 21:38    EKG:   Orders placed or performed during the hospital encounter of 08/23/2014  . ED EKG 12-Lead  . ED EKG 12-Lead    IMPRESSION AND PLAN:   81 year old Caucasian gentleman history of prostate cancer on active chemotherapy presenting with fever and shortness of breath.  1.Sepsis, meeting septic criteria by tachycardia, leukocytosis, respiratory rate present on arrival. Source community-acquired pneumonia Code sepsis initiated. Panculture. Broad-spectrum antibiotics including vancomycin/Zosyn and taper antibiotics when culture data returns. He has received a 30 mL/kg IV fluid bolus. Continue IV fluid hydration to keep mean arterial pressure greater than 65. He may require pressor therapy if blood pressure worsens. We will repeat lactic acid given the initial is greater than 2.2.  2. Acute respiratory failure with hypoxia: Now intubated, continue full vent support wean FiO2 and PEEP as tolerated, add DuoNeb treatments 3.  Essential hypertension: Atenolol, Norvasc 4. Hyperlipidemia unspecified: Lipitor 5. Venous thromboembolism prophylactic: Heparin subcutaneous     All the records are reviewed and case discussed with ED provider. Management plans discussed with the patient, family and they are in agreement.  CODE STATUS: Full as discussed with family at bedside  [place] THIS PATIENT: 55 critical care minutes.    Dalia Jollie,  Karenann Cai.D on 08/12/2014 at 10:01 PM  Between 7am to 6pm - Pager - [phone]  After 6pm: House Pager: - [phone]  [facility]  Office  [phone]  CC: Primary care physician; Lelon Huh, MD

## 2014-08-19 NOTE — ED Notes (Signed)
Prep to intubate, Monique contacted for code sepsis

## 2014-08-19 NOTE — Sedation Documentation (Signed)
7.5 ETT placed 20 at lips, postive color change

## 2014-08-19 NOTE — ED Notes (Signed)
Pt to ED with c/o fever, chills, and cough, pt has prostate CA and is receiving chemo at [facility], states he was placed on Levaquin last week for sinus infection, called his CA Dr. Today at [facility] and was told to come to ED for further evaluation, denies any n,v

## 2014-08-19 NOTE — ED Notes (Signed)
Pt c/o fever and malaise since last two days after receiving chemo treatment for prostate CA, bladder cancer hx

## 2014-08-19 NOTE — ED Notes (Signed)
CRITICAL LAB TROPONIN 0.08, Dr Reita Cliche notified

## 2014-08-19 NOTE — ED Provider Notes (Signed)
[facility] Emergency Department Provider Note   ____________________________________________  Time seen: 6:50 PM I have reviewed the triage vital signs and the triage nursing note.  HISTORY  Chief Complaint Fever; Chills; and Cough   Historian Patient, wife, and daughter  HPI Marcus Blevins is a 81 y.o. male who has a history of being on chemotherapy for prostate cancer, who has been started for days ago on Levaquin for sinus infection and a cough, was presenting due to a fever at home of 100.9, chills, persistent cough, and being asked to come in by his oncologist. No nausea, vomiting, or diarrhea. Moderate cough but no chest pain. Mild shortness breath.    Past Medical History  Diagnosis Date  . Cancer     prostate  . Heart disease   . Prostate cancer 2014    8 weeks of radiation, [facility]  . Hypertension   . Hyperlipidemia     Patient Active Problem List   Diagnosis Date Noted  . Asthma 07/15/2014  . Insomnia 07/15/2014  . Neutropenic fever 07/06/2014  . Bone metastases 04/21/2014  . Cancer of anterior wall of urinary bladder 04/01/2014  . Allergic rhinitis 07/24/2012  . ED (erectile dysfunction) of organic origin 11/07/2011  . Coronary artery disease 10/22/2011  . Diabetes 02/23/2008  . Prostate cancer 12/15/2006  . Diverticulosis of colon without hemorrhage 06/17/2005  . Essential (primary) hypertension 01/14/2005  . HLD (hyperlipidemia) 01/14/2005    Past Surgical History  Procedure Laterality Date  . Colonoscopy  2007  . Cholecystectomy  1958  . Bladder scraping  03-15-14    [facility], for bladder cancer  . Tumor removed from bladder      Current Outpatient Rx  Name  Route  Sig  Dispense  Refill  . albuterol (PROVENTIL HFA;VENTOLIN HFA) 108 (90 BASE) MCG/ACT inhaler   Inhalation   Inhale 2 puffs into the lungs every 6 (six) hours as needed for wheezing.   1 Inhaler   1   . amLODipine (NORVASC) 5 MG tablet   Oral   Take 5 mg by  mouth daily.         Marland Kitchen aspirin EC 81 MG tablet   Oral   Take 81 mg by mouth daily.         Marland Kitchen atenolol (TENORMIN) 50 MG tablet   Oral   Take 50 mg by mouth daily.         Marland Kitchen atorvastatin (LIPITOR) 20 MG tablet   Oral   Take 20 mg by mouth at bedtime.         . clotrimazole-betamethasone (LOTRISONE) cream   Topical   Apply 1 application topically 2 (two) times daily.         Marland Kitchen dexamethasone (DECADRON) 4 MG tablet   Oral   Take 8 mg by mouth 2 (two) times daily. Pt only takes the day before and day after  DOCEtaxel.         . Diphenhyd-Hydrocort-Nystatin (FIRST-DUKES MOUTHWASH MT)            2   . diphenhydramine-acetaminophen (TYLENOL PM) 25-500 MG TABS   Oral   Take 1 tablet by mouth at bedtime as needed (for sleep).         . fluticasone (FLONASE) 50 MCG/ACT nasal spray   Each Nare   Place 1 spray into both nostrils daily.         . folic acid (FOLVITE) 449 MCG tablet   Oral   Take  400 mcg by mouth daily.         . hyoscyamine (LEVSIN SL) 0.125 MG SL tablet   Sublingual   Place 1 tablet (0.125 mg total) under the tongue every 4 (four) hours as needed.   30 tablet   0   . leuprolide, 6 Month, (ELIGARD) 45 MG injection   Subcutaneous   Inject 45 mg into the skin every 6 (six) months.         . Melatonin 5 MG TABS   Oral   Take 1 tablet by mouth at bedtime as needed (for sleep).          . mometasone (ASMANEX) 220 MCG/INH inhaler   Inhalation   Inhale 1 puff into the lungs 2 (two) times daily.         . montelukast (SINGULAIR) 10 MG tablet      TAKE 1 TABLET BY MOUTH DAILY AS NEEDED FOR SINUS DRAINAGE   30 tablet   3   . prochlorperazine (COMPAZINE) 10 MG tablet   Oral   Take 10 mg by mouth every 6 (six) hours as needed for nausea or vomiting.         . pyridOXINE (VITAMIN B-6) 100 MG tablet   Oral   Take 100 mg by mouth daily.         . solifenacin (VESICARE) 10 MG tablet   Oral   Take 10 mg by mouth at bedtime.          . tamsulosin (FLOMAX) 0.4 MG CAPS capsule   Oral   Take 0.4 mg by mouth daily.         . vitamin B-12 (CYANOCOBALAMIN) 1000 MCG tablet   Oral   Take 1,000 mcg by mouth daily.           Allergies Review of patient's allergies indicates no known allergies.  Family History  Problem Relation Age of Onset  . CVA Mother   . Heart attack Father     Social History Social History  Substance Use Topics  . Smoking status: Former Smoker -- 1.00 packs/day for 20 years    Quit date: 01/07/1954  . Smokeless tobacco: Never Used  . Alcohol Use: No    Review of Systems  Constitutional: appetite is okay Eyes: Negative for visual changes. ENT: Negative for sore throat. Cardiovascular: Negative for chest pain. Respiratory: per history of present illness Gastrointestinal: Negative for abdominal pain, vomiting and diarrhea. Genitourinary: Negative for dysuria. Musculoskeletal: Negative for back pain. Skin: Negative for rash. Neurological: Negative for headaches, focal weakness or numbness. 10 point Review of Systems otherwise negative ____________________________________________   PHYSICAL EXAM:  VITAL SIGNS: ED Triage Vitals  Enc Vitals Group     BP 08/08/2014 1758 142/50 mmHg     Pulse Rate 08/10/2014 1758 128     Resp 09/07/2014 1758 20     Temp 08/20/2014 1758 98.5 F (36.9 C)     Temp Source 08/26/2014 1758 Oral     SpO2 08/14/2014 1758 93 %     Weight 08/31/2014 1758 140 lb (63.504 kg)     Height 08/18/2014 1758 5\' 7"  (1.702 m)     Head Cir --      Peak Flow --      Pain Score 08/14/2014 1800 2     Pain Loc --      Pain Edu? --      Excl. in [place]? --      Constitutional: Alert and oriented. Well  appearing and in no distress. Eyes: Conjunctivae are normal. PERRL. Normal extraocular movements. ENT   Head: Normocephalic and atraumatic.   Nose: No congestion/rhinnorhea.   Mouth/Throat: Mucous membranes are moist.   Neck: No stridor. Cardiovascular/Chest: Normal  rate, regular rhythm.  No murmurs, rubs, or gallops. Respiratory: Normal respiratory effort without tachypnea nor retractions. Rhonchi posteriorly. Wheezing. Gastrointestinal: Soft. No distention, no guarding, no rebound. Nontender Genitourinary/rectal:Deferred Musculoskeletal: Nontender with normal range of motion in all extremities. No joint effusions.  No lower extremity tenderness nor edema. Neurologic:  Normal speech and language. No gross or focal neurologic deficits are appreciated. Skin:  Skin is warm, dry and intact. No rash noted. Psychiatric: Mood and affect are normal. Speech and behavior are normal. Patient exhibits appropriate insight and judgment.  ____________________________________________   EKG I, Lisa Roca, MD, the attending physician have personally viewed and interpreted all ECGs.  123 bpm. Undetermined rhythm. I suspect sinus tachycardia with PVCs. Left bundle branch block. Normal axis. ST segment depression and T wave inversion inferiorly and laterally. ____________________________________________  LABS (pertinent positives/negatives)  Metabolic panel significant for potassium 3.2, CO2 18 White blood cell count 32.9, hemoglobin 9.3, platelets 196 Troponin 0.08 Lactate 4.0  ____________________________________________  RADIOLOGY All Xrays were viewed by me. Imaging interpreted by Radiologist.  Chest x-ray two-view: Right middle lobe pneumonia, left lower lobe opacity atelectasis versus pneumonia __________________________________________  PROCEDURES  Procedure(s) performed: INTUBATION Performed by: Lisa Roca  Required items: required blood products, implants, devices, and special equipment available Patient identity confirmed: provided demographic data and hospital-assigned identification number Time out: Immediately prior to procedure a "time out" was called to verify the correct patient, procedure, equipment, support staff and site/side marked  as required.  Indications:respiratory failure due to pneumonia  Intubation method: Glidescope Laryngoscopy   Preoxygenation: BiPAP Sedatives: Etomidate Paralytic: Succinylcholine  Tube Size: 7.5 cuffed  Post-procedure assessment: chest rise and ETCO2 monitor Breath sounds: equal and absent over the epigastrium Tube secured with: ETT holder Chest x-ray interpreted by radiologist and me.  Chest x-ray findings: endotracheal tube in appropriate position  Patient tolerated the procedure well with no immediate complications.   Critical Care performed:CRITICAL CARE Performed by: Lisa Roca   Total critical care time: 40 minutes  Critical care time was exclusive of separately billable procedures and treating other patients.  Critical care was necessary to treat or prevent imminent or life-threatening deterioration.  Critical care was time spent personally by me on the following activities: development of treatment plan with patient and/or surrogate as well as nursing, discussions with consultants, evaluation of patient's response to treatment, examination of patient, obtaining history from patient or surrogate, ordering and performing treatments and interventions, ordering and review of laboratory studies, ordering and review of radiographic studies, pulse oximetry and re-evaluation of patient's condition.   ____________________________________________   ED COURSE / ASSESSMENT AND PLAN  CONSULTATIONS: face-to-face with hospitalist for admission  Pertinent labs & imaging results that were available during my care of the patient were reviewed by me and considered in my medical decision making (see chart for details).   Patient arrived with a complaint of fever at home although no fever found here. He is tachycardic and reports a cough. His chest x-ray confirms right middle lobe pneumonia and possibly also left lower lobe pneumonia. He's already been on 4 days of Levaquin so he  has actually failed outpatient therapy. Out of concern for possible sepsis with the elevated heart rate and the pneumonia, he is being started on fluid bolus  and placed on vancomycin and Zosyn antibiotics.  Patient had increasing work of breathing, respiratory rate into the high 30s and highly uncomfortable with a heart rate up into the 150s. Patient placed on BiPAP, however still very uncomfortable. I talked with the patient and the family about intubation, we will proceed with elective intubation for respiratory distress/respiratory failure due to pneumonia.  Patient / Family / Caregiver informed of clinical course, medical decision-making process, and agree with plan.    ___________________________________________   FINAL CLINICAL IMPRESSION(S) / ED DIAGNOSES   Final diagnoses:  Right middle lobe pneumonia  respiratory failure Sepsis due to pneumonia     Lisa Roca, MD 08/23/2014 2120

## 2014-08-19 NOTE — Sedation Documentation (Signed)
Lactic acid CRITICAL 4.0, Dr Reita Cliche notified

## 2014-08-20 ENCOUNTER — Inpatient Hospital Stay: Payer: Medicare Other

## 2014-08-20 ENCOUNTER — Inpatient Hospital Stay
Admit: 2014-08-20 | Discharge: 2014-08-20 | Disposition: A | Discharge status: Home / Self Care | Payer: Medicare Other | Attending: Internal Medicine | Admitting: Internal Medicine

## 2014-08-20 DIAGNOSIS — J9601 Acute respiratory failure with hypoxia: Secondary | ICD-10-CM

## 2014-08-20 DIAGNOSIS — R6520 Severe sepsis without septic shock: Secondary | ICD-10-CM

## 2014-08-20 DIAGNOSIS — I4891 Unspecified atrial fibrillation: Secondary | ICD-10-CM | POA: Diagnosis present

## 2014-08-20 DIAGNOSIS — I214 Non-ST elevation (NSTEMI) myocardial infarction: Secondary | ICD-10-CM

## 2014-08-20 LAB — LACTIC ACID, PLASMA
Lactic Acid, Venous: 1.5 mmol/L (ref 0.5–2.0)
Lactic Acid, Venous: 2.2 mmol/L (ref 0.5–2.0)

## 2014-08-20 LAB — COMPREHENSIVE METABOLIC PANEL
ALBUMIN: 2.6 g/dL — AB (ref 3.5–5.0)
ALK PHOS: 99 U/L (ref 38–126)
ALK PHOS: 97 U/L (ref 38–126)
ALT: 14 U/L — ABNORMAL LOW (ref 17–63)
ALT: 17 U/L (ref 17–63)
ANION GAP: 8 (ref 5–15)
ANION GAP: 6 (ref 5–15)
AST: 38 U/L (ref 15–41)
AST: 60 U/L — ABNORMAL HIGH (ref 15–41)
Albumin: 2.7 g/dL — ABNORMAL LOW (ref 3.5–5.0)
BUN: 17 mg/dL (ref 6–20)
BUN: 19 mg/dL (ref 6–20)
CALCIUM: 6.5 mg/dL — AB (ref 8.9–10.3)
CHLORIDE: 108 mmol/L (ref 101–111)
CHLORIDE: 108 mmol/L (ref 101–111)
CO2: 20 mmol/L — ABNORMAL LOW (ref 22–32)
CO2: 22 mmol/L (ref 22–32)
CREATININE: 1.63 mg/dL — AB (ref 0.61–1.24)
Calcium: 6.9 mg/dL — ABNORMAL LOW (ref 8.9–10.3)
Creatinine, Ser: 1.04 mg/dL (ref 0.61–1.24)
GFR, EST AFRICAN AMERICAN: 43 mL/min — AB (ref >60)
GFR, EST NON AFRICAN AMERICAN: 37 mL/min — AB (ref >60)
GLUCOSE: 167 mg/dL — AB (ref 65–99)
Glucose, Bld: 137 mg/dL — ABNORMAL HIGH (ref 65–99)
Potassium: 3.9 mmol/L (ref 3.5–5.1)
Potassium: 4.4 mmol/L (ref 3.5–5.1)
SODIUM: 136 mmol/L (ref 135–145)
Sodium: 136 mmol/L (ref 135–145)
TOTAL PROTEIN: 5 g/dL — AB (ref 6.5–8.1)
Total Bilirubin: <0.1 mg/dL — ABNORMAL LOW (ref 0.3–1.2)
Total Bilirubin: 0.4 mg/dL (ref 0.3–1.2)
Total Protein: 5.4 g/dL — ABNORMAL LOW (ref 6.5–8.1)

## 2014-08-20 LAB — BLOOD GAS, ARTERIAL
Acid-base deficit: 7.1 mmol/L — ABNORMAL HIGH (ref 0.0–2.0)
Acid-base deficit: 8.6 mmol/L — ABNORMAL HIGH (ref 0.0–2.0)
Allens test (pass/fail): POSITIVE — AB
BICARBONATE: 18.8 meq/L — AB (ref 21.0–28.0)
Bicarbonate: 17.9 mEq/L — ABNORMAL LOW (ref 21.0–28.0)
FIO2: 45
FIO2: 0.6
MECHVT: 450 mL
Mechanical Rate: 22
O2 SAT: 86.8 %
O2 Saturation: 93 %
PCO2 ART: 34 mmHg (ref 32.0–48.0)
PEEP/CPAP: 5 cmH2O
PEEP/CPAP: 5 cmH2O
PH ART: 7.33 — AB (ref 7.350–7.450)
PH ART: 7.23 — AB (ref 7.350–7.450)
Patient temperature: 37
Patient temperature: 37
RATE: 14 resp/min
VT: 450 mL
pCO2 arterial: 45 mmHg (ref 32.0–48.0)
pO2, Arterial: 72 mmHg — ABNORMAL LOW (ref 83.0–108.0)
pO2, Arterial: 63 mmHg — ABNORMAL LOW (ref 83.0–108.0)

## 2014-08-20 LAB — CBC
HCT: 27.6 % — ABNORMAL LOW (ref 40.0–52.0)
Hemoglobin: 8.6 g/dL — ABNORMAL LOW (ref 13.0–18.0)
MCH: 25.8 pg — AB (ref 26.0–34.0)
MCHC: 31.2 g/dL — AB (ref 32.0–36.0)
MCV: 82.6 fL (ref 80.0–100.0)
PLATELETS: 181 10*3/uL (ref 150–440)
RBC: 3.34 MIL/uL — AB (ref 4.40–5.90)
RDW: 19.2 % — AB (ref 11.5–14.5)
WBC: 34.4 10*3/uL — ABNORMAL HIGH (ref 3.8–10.6)

## 2014-08-20 LAB — APTT: APTT: 36 s (ref 24–36)

## 2014-08-20 LAB — EXPECTORATED SPUTUM ASSESSMENT W REFEX TO RESP CULTURE: SPECIAL REQUESTS: NORMAL

## 2014-08-20 LAB — PROTIME-INR
INR: 1.2
Prothrombin Time: 15.4 seconds — ABNORMAL HIGH (ref 11.4–15.0)

## 2014-08-20 LAB — MAGNESIUM
Magnesium: 1.5 mg/dL — ABNORMAL LOW (ref 1.7–2.4)
Magnesium: 2.2 mg/dL (ref 1.7–2.4)

## 2014-08-20 LAB — EXPECTORATED SPUTUM ASSESSMENT W GRAM STAIN, RFLX TO RESP C

## 2014-08-20 LAB — TROPONIN I
TROPONIN I: 1.03 ng/mL — AB (ref <0.031)
TROPONIN I: 6.44 ng/mL — AB (ref <0.031)
Troponin I: 4.15 ng/mL — ABNORMAL HIGH (ref <0.031)

## 2014-08-20 LAB — GLUCOSE, CAPILLARY
GLUCOSE-CAPILLARY: 110 mg/dL — AB (ref 65–99)
Glucose-Capillary: 183 mg/dL — ABNORMAL HIGH (ref 65–99)

## 2014-08-20 LAB — MRSA PCR SCREENING: MRSA by PCR: NEGATIVE

## 2014-08-20 MED ORDER — APIXABAN 5 MG PO TABS
5.0000 mg | ORAL_TABLET | Freq: Two times a day (BID) | ORAL | Status: DC
  Administered 2014-08-20 – 2014-08-21 (×3): 5 mg via ORAL
  Filled 2014-08-20 (×4): qty 1

## 2014-08-20 MED ORDER — NOREPINEPHRINE BITARTRATE 1 MG/ML IV SOLN
0.0000 ug/min | INTRAVENOUS | Status: DC
  Filled 2014-08-20: qty 4

## 2014-08-20 MED ORDER — VITAL AF 1.2 CAL PO LIQD
1000.0000 mL | ORAL | Status: DC
  Administered 2014-08-20: 1000 mL

## 2014-08-20 MED ORDER — SODIUM CHLORIDE 0.9 % IV SOLN
1.0000 mg/h | INTRAVENOUS | Status: DC
  Administered 2014-08-20: 1 mg/h via INTRAVENOUS
  Filled 2014-08-20: qty 10

## 2014-08-20 MED ORDER — METOPROLOL TARTRATE 25 MG PO TABS: 12.5000 mg | ORAL_TABLET | Freq: Two times a day (BID) | ORAL | Status: DC

## 2014-08-20 MED ORDER — ANTISEPTIC ORAL RINSE SOLUTION (CORINZ)
7.0000 mL | Freq: Four times a day (QID) | OROMUCOSAL | Status: DC
  Administered 2014-08-20 – 2014-08-25 (×18): 7 mL via OROMUCOSAL
  Filled 2014-08-20 (×22): qty 7

## 2014-08-20 MED ORDER — DOCUSATE SODIUM 50 MG/5ML PO LIQD: 100.0000 mg | Freq: Two times a day (BID) | ORAL | Status: DC | PRN

## 2014-08-20 MED ORDER — HEPARIN BOLUS VIA INFUSION
4000.0000 [IU] | Freq: Once | INTRAVENOUS | Status: AC
  Administered 2014-08-20: 4000 [IU] via INTRAVENOUS
  Filled 2014-08-20: qty 4000

## 2014-08-20 MED ORDER — NOREPINEPHRINE 4 MG/250ML-% IV SOLN
0.0000 ug/min | INTRAVENOUS | Status: DC
  Administered 2014-08-20: 3 ug/min via INTRAVENOUS
  Administered 2014-08-21: 7 ug/min via INTRAVENOUS
  Administered 2014-08-21: 8 ug/min via INTRAVENOUS
  Administered 2014-08-22 – 2014-08-23 (×3): 6 ug/min via INTRAVENOUS
  Administered 2014-08-23: 12 ug/min via INTRAVENOUS
  Administered 2014-08-23: 8 ug/min via INTRAVENOUS
  Administered 2014-08-23: 6 ug/min via INTRAVENOUS
  Filled 2014-08-20 (×7): qty 250

## 2014-08-20 MED ORDER — VITAL HIGH PROTEIN PO LIQD: 1000.0000 mL | ORAL | Status: DC

## 2014-08-20 MED ORDER — FUROSEMIDE 10 MG/ML IJ SOLN
20.0000 mg | Freq: Once | INTRAMUSCULAR | Status: AC
  Administered 2014-08-20: 20 mg via INTRAVENOUS
  Filled 2014-08-20: qty 2

## 2014-08-20 MED ORDER — DEXTROSE 5 % IV SOLN
INTRAVENOUS | Status: DC
  Administered 2014-08-20: 17:00:00 via INTRAVENOUS
  Filled 2014-08-20: qty 150

## 2014-08-20 MED ORDER — HEPARIN (PORCINE) IN NACL 100-0.45 UNIT/ML-% IJ SOLN
800.0000 [IU]/h | INTRAMUSCULAR | Status: DC
  Administered 2014-08-20: 800 [IU]/h via INTRAVENOUS
  Filled 2014-08-20: qty 250

## 2014-08-20 MED ORDER — DEXTROSE 5 % IV SOLN
500.0000 mg | INTRAVENOUS | Status: DC
  Administered 2014-08-20 – 2014-08-21 (×2): 500 mg via INTRAVENOUS
  Filled 2014-08-20 (×4): qty 500

## 2014-08-20 MED ORDER — FREE WATER
25.0000 mL | Status: DC
  Administered 2014-08-20 – 2014-08-25 (×28): 25 mL

## 2014-08-20 MED ORDER — SODIUM CHLORIDE 0.9 % IV BOLUS (SEPSIS)
500.0000 mL | Freq: Once | INTRAVENOUS | Status: AC
  Administered 2014-08-20: 500 mL via INTRAVENOUS

## 2014-08-20 MED ORDER — FENTANYL CITRATE (PF) 100 MCG/2ML IJ SOLN
50.0000 ug | INTRAMUSCULAR | Status: DC | PRN
  Administered 2014-08-24: 50 ug via INTRAVENOUS
  Filled 2014-08-20: qty 2

## 2014-08-20 MED ORDER — DEXMEDETOMIDINE HCL IN NACL 400 MCG/100ML IV SOLN
0.4000 ug/kg/h | INTRAVENOUS | Status: DC
  Administered 2014-08-21: 0.772 ug/kg/h via INTRAVENOUS
  Administered 2014-08-21: 0.4 ug/kg/h via INTRAVENOUS
  Administered 2014-08-21: 0.7 ug/kg/h via INTRAVENOUS
  Administered 2014-08-22: 0.4 ug/kg/h via INTRAVENOUS
  Administered 2014-08-22 – 2014-08-23 (×4): 0.7 ug/kg/h via INTRAVENOUS
  Administered 2014-08-23: 0.6 ug/kg/h via INTRAVENOUS
  Administered 2014-08-23: 0.4 ug/kg/h via INTRAVENOUS
  Administered 2014-08-24: 1.109 ug/kg/h via INTRAVENOUS
  Administered 2014-08-24: 1.152 ug/kg/h via INTRAVENOUS
  Filled 2014-08-20 (×10): qty 100

## 2014-08-20 MED ORDER — METOPROLOL TARTRATE 25 MG PO TABS
25.0000 mg | ORAL_TABLET | Freq: Two times a day (BID) | ORAL | Status: DC
  Administered 2014-08-20: 25 mg via ORAL
  Filled 2014-08-20: qty 1

## 2014-08-20 MED ORDER — CHLORHEXIDINE GLUCONATE 0.12% ORAL RINSE (MEDLINE KIT)
15.0000 mL | Freq: Two times a day (BID) | OROMUCOSAL | Status: DC
  Administered 2014-08-20 – 2014-08-25 (×10): 15 mL via OROMUCOSAL
  Filled 2014-08-20 (×10): qty 15

## 2014-08-20 MED ORDER — SENNOSIDES 8.8 MG/5ML PO SYRP: 5.0000 mL | ORAL_SOLUTION | Freq: Every evening | ORAL | Status: DC | PRN

## 2014-08-20 MED ORDER — MAGNESIUM SULFATE 2 GM/50ML IV SOLN
2.0000 g | Freq: Once | INTRAVENOUS | Status: AC
  Administered 2014-08-20: 2 g via INTRAVENOUS
  Filled 2014-08-20: qty 50

## 2014-08-20 MED ORDER — FENTANYL 2500MCG IN NS 250ML (10MCG/ML) PREMIX INFUSION
10.0000 ug/h | INTRAVENOUS | Status: DC
  Administered 2014-08-20: 200 ug/h via INTRAVENOUS
  Administered 2014-08-21: 250 ug/h via INTRAVENOUS
  Administered 2014-08-21: 25 ug/h via INTRAVENOUS
  Administered 2014-08-22: 300 ug/h via INTRAVENOUS
  Administered 2014-08-22 (×2): 200 ug/h via INTRAVENOUS
  Administered 2014-08-22: 10 ug/h via INTRAVENOUS
  Administered 2014-08-23: 25 ug/h via INTRAVENOUS
  Administered 2014-08-23: 200 ug/h via INTRAVENOUS
  Administered 2014-08-23 – 2014-08-24 (×4): 250 ug/h via INTRAVENOUS
  Administered 2014-08-25 (×2): 10 ug/h via INTRAVENOUS
  Filled 2014-08-20 (×12): qty 250

## 2014-08-20 MED ORDER — FENTANYL CITRATE (PF) 100 MCG/2ML IJ SOLN: 50.0000 ug | INTRAMUSCULAR | Status: DC | PRN

## 2014-08-20 NOTE — Progress Notes (Signed)
*  PRELIMINARY RESULTS* Echocardiogram 2D Echocardiogram has been performed.  Marcus Blevins 08/20/2014, 1:34 PM

## 2014-08-20 NOTE — Progress Notes (Signed)
Update - Patient with hypotension, requiring increase sedation for agitation and vent dysynchrony  Plan - stop versed gtt - stop fentanyl gtt - give NS 500cc IV bolus - change metoprolol 25 to 12.5mg  for afib rate control - start precedex for agitation on the vent, and prn Fentanyl for analgesia.  Vilinda Boehringer, MD [facility] Pager [phone] (Please enter 7-digits)

## 2014-08-20 NOTE — Progress Notes (Signed)
t5alked with dr Marcille Blanco about v tach checking mag level Tamsen Snider, RN

## 2014-08-20 NOTE — Progress Notes (Signed)
Called dr hower about labs Marcus Snider, RN

## 2014-08-20 NOTE — Progress Notes (Signed)
Patient remains intubated on ventilator and sedated with fentanyl and versed. Sedation wean this morning, patient never followed commands, although was moving bilateral upper arms toward ETT and able to raise bilateral eyebrows equally, was resedated due to elevated heart rate (120s), breathing out of sync with vent, and oxygen desaturation. Tube feedings ordered, pending start due to hypotension, patient unable to tolerate HOB >30. [facility] to bedside at end of shift to place RUA PICC, levophed started, and BP has improved to MAP>65. Foley catheter in place draining minimal urine to bag. Critical ABG, rate adjustment on ventilator per Dr Clayton Bibles and Dr Stevenson Clinch, recheck scheduled at [facility]. Elevated temperature this morning, has been afebrile since.ECG changes, cardiology consulted. Resting quietly at this time. Wife and daughter have been at bedside intermittently.

## 2014-08-20 NOTE — Progress Notes (Signed)
Mag results finally ack called to md ordering meds pt still having runs of vent ectopic Tamsen Snider, RN

## 2014-08-20 NOTE — Progress Notes (Signed)
Informed by nursing staff elevated lactic acid as well as troponin Suspect most likely related to cardiac strain given infection, intubation however denies that is a diagnosis of exclusion will initiate heparin drip continue to follow cardiac enzymes

## 2014-08-20 NOTE — Progress Notes (Signed)
Initial Nutrition Assessment   INTERVENTION:   EN: recommend starting Vital 1.2 at rate of 25 ml/hr with goal rate of 56 ml/hr providing 101 g of protein, 1613 kcals, 1089 ml of free water. Pt on IVF, will order standard flush. Continue to assess   NUTRITION DIAGNOSIS:   Inadequate oral intake related to acute illness as evidenced by NPO status.   GOAL:   Provide needs based on ASPEN/SCCM guidelines   MONITOR:    (Energy Intake, EN, Digestive System, Anthropometrics, Electrolyte/Renal profile, Glucose Profile)  REASON FOR ASSESSMENT:   Consult Enteral/tube feeding initiation and management  ASSESSMENT:    Pt admitted with severe sepsis with acute respiratory failure due to pneumonia requiring intubation, currently sedated on vent  Past Medical History  Diagnosis Date  . Cancer     prostate  . Heart disease   . Prostate cancer 2014    8 weeks of radiation, [facility]  . Hypertension   . Hyperlipidemia    Past Surgical History  Procedure Laterality Date  . Colonoscopy  2007  . Cholecystectomy  1958  . Bladder scraping  03-15-14    [facility], for bladder cancer  . Tumor removed from bladder      Diet Order:  Diet NPO time specified Except for: Sips with Meds    Skin:  Reviewed, no issues  Electrolyte and Renal Profile:  Recent Labs Lab 09/01/2014 2025 08/20/14 0137 08/20/14 1037  BUN 18 17  --   CREATININE 0.99 1.04  --   NA 135 136  --   K 3.2* 3.9  --   MG  --  1.5* 2.2   Glucose Profile:  Recent Labs  08/18/2014 2055 08/15/2014 2311  GLUCAP 143* 171*   Nutritional Anemia Profile:  CBC Latest Ref Rng 08/20/2014 09/03/2014 07/08/2014  WBC 3.8 - 10.6 K/uL 34.4(H) 32.9(H) 3.1(L)  Hemoglobin 13.0 - 18.0 g/dL 8.6(L) 9.3(L) 8.7(L)  Hematocrit 40.0 - 52.0 % 27.6(L) 29.3(L) 26.4(L)  Platelets 150 - 440 K/uL 181 196 154    Meds: NS at 50 ml/hr, lasix  Height:   Ht Readings from Last 1 Encounters:  09/04/2014 5\' 7"  (1.702 m)    Weight:   Wt Readings from Last  1 Encounters:  08/18/2014 144 lb (65.318 kg)     BMI:  Body mass index is 22.55 kg/(m^2).  Estimated Nutritional Needs:   Kcal:  1620 kcals (Ve: 9, Tmax: 37.8) using current wt of 65.3 kg  Protein:  98-130 g (1.5-2.0 g/kg)   Fluid:  1625-1950 mL (25-30  ml/kg)    HIGH Care Level  Kerman Passey MS, RD, LDN [phone] Pager

## 2014-08-20 NOTE — Progress Notes (Signed)
[place]  SUBJECTIVE: Less sob. Still with afib with rvr. Hemodynaically stable. K was 3.2 last pm, magnesium 2.0   Filed Vitals:   08/20/14 0700 08/20/14 0730 08/20/14 0800 08/20/14 0900  BP: 99/64 98/53 107/67 90/59  Pulse: 97 97 100 124  Temp:  100.1 F (37.8 C)    TempSrc:  Oral    Resp: 14 13 13 19   Height:      Weight:      SpO2: 96% 98% 95% 94%    Intake/Output Summary (Last 24 hours) at 08/20/14 1025 Last data filed at 08/20/14 0600  Gross per 24 hour  Intake 1318.5 ml  Output    435 ml  Net  883.5 ml    LABS: Basic Metabolic Panel:  Recent Labs  09/07/2014 2025 08/20/14 0137  NA 135 136  K 3.2* 3.9  CL 107 108  CO2 18* 20*  GLUCOSE 135* 167*  BUN 18 17  CREATININE 0.99 1.04  CALCIUM 7.5* 6.9*  MG  --  1.5*   Liver Function Tests:  Recent Labs  08/20/2014 2025 08/20/14 0137  AST 30 38  ALT 14* 14*  ALKPHOS 118 99  BILITOT 0.4 <0.1*  PROT 5.8* 5.0*  ALBUMIN 3.0* 2.6*   No results for input(s): LIPASE, AMYLASE in the last 72 hours. CBC:  Recent Labs  08/30/2014 2025 08/20/14 0137  WBC 32.9* 34.4*  NEUTROABS 32.6*  --   HGB 9.3* 8.6*  HCT 29.3* 27.6*  MCV 83.2 82.6  PLT 196 181   Cardiac Enzymes:  Recent Labs  09/04/2014 2025 08/20/14 0003 08/20/14 0510  TROPONINI 0.08* 1.03* 4.15*   BNP: Invalid input(s): POCBNP D-Dimer: No results for input(s): DDIMER in the last 72 hours. Hemoglobin A1C: No results for input(s): HGBA1C in the last 72 hours. Fasting Lipid Panel: No results for input(s): CHOL, HDL, LDLCALC, TRIG, CHOLHDL, LDLDIRECT in the last 72 hours. Thyroid Function Tests: No results for input(s): TSH, T4TOTAL, T3FREE, THYROIDAB in the last 72 hours.  Invalid input(s): FREET3 Anemia Panel: No results for input(s): VITAMINB12, FOLATE, FERRITIN, TIBC, IRON, RETICCTPCT in the last 72 hours.   Physical Exam: Blood pressure 90/59, pulse 124, temperature 100.1 F (37.8 C), temperature  source Oral, resp. rate 19, height 5\' 7"  (1.702 m), weight 65.318 kg (144 lb), SpO2 94 %. .   General appearance: alert and cooperative Head: Normocephalic, without obvious abnormality, atraumatic Resp: rhonchi bilaterally Cardio: irregularly irregular rhythm GI: soft, non-tender; bowel sounds normal; no masses,  no organomegaly Extremities: extremities normal, atraumatic, no cyanosis or edema Pulses: 2+ and symmetric Neurologic: Grossly normal  TELEMETRY: Reviewed telemetry pt in afib with variable ventricular response:  ASSESSMENT AND PLAN:  Active Problems:   Severe sepsis-continue with iv abx   Acute respiratory failure with hypoxia-off oxygen at present. Continue with abx   A-fib-rate is still ]not well controlled. Will continue with amiodarone iv and add eliquis for anticoagulationj and stop heparin. Add metorpolol 25 mg bid for rate control    Teodoro Spray., MD, [facility] [place] 08/20/2014 10:25 AM

## 2014-08-20 NOTE — Consult Note (Signed)
PULMONARY / CRITICAL CARE MEDICINE   Name: Marcus Blevins MRN: [id number] DOB: 07-10-1927    ADMISSION DATE:  08/24/2014 CONSULTATION DATE:  08/20/14  REFERRING MD :  Dr. Theodoro Grist   CHIEF COMPLAINT:   Fever, chills and cough     HISTORY OF PRESENT ILLNESS   Patient intubated and sedated, History obtained from chart review. Called all contact number listed in the chart, unable to establish contact with daughter or wife, left VM on daughter phone.   81 y.o. male who has a history of being on chemotherapy for prostate cancer, who has been started on Levaquin for sinus infection and a cough 2-3 days prior, was presenting due to a fever at home of 100.9, chills, persistent cough, and being asked to come in by his oncologist on 08/26/2014, presented to [facility] ED. No nausea, vomiting, or diarrhea. Moderate cough but no chest pain. Mild shortness breath, initially.  While in the ED, his respiratory status rapidly declined and he was emergently intubated.  CXR showed B\L opacites, consisted with Pulm edema, and his Troponins were elevated, he is currently on a heparin gtt.    Chart Review by Dr. Stevenson Clinch Heme/Onc 08/11/14 - [facility] - Dr. Hulen Shouts HISTORY OF PRESENT ILLNESS  Marcus Blevins was initially diagnosed with prostate cancer in 2014, with Gleason 4+4=8 adenocarcinoma in 3/14 cores and Gleason 4+3=7 adenocarcinoma in 4/14 cores. H completed radiation in July 2014 and 6 months of ADT in 09/2012. His PSA nadired at 0.06 in April 2015 and has ben steadily rising since then, with castrate levels of testosterone. His PSA rose to 7.01 in March and bone scan yesterday showed significant osseous metastases throughout. CT showed subcm aortocaval LNs as well as a 1.9cm R pelvic side wall lymph node. He started bicalutamide yesterday. See Oncologic History for full details.   ONCOLOGIC HISTORY  Prostate cancer - 12/23/11, PSA 15.13 - 02/06/12, TRUS biopsy Gleason 4+3=7 adenocarcinoma in 4/14 cores, Gleason  4+4=8 in 3/14 cores - 07/2012, completed external beam XRT - 09/2012, completed ADT - 10/27/12, PSA 0.08  - 05/04/13, PSA 0.06, T<10 - 10/2013, PSA 0.27, T 45 - 01/21/14, PSA 1.36, T 41 - 04/01/14, PSA 7.01 - 04/20/14, bone scan: extensive multifocal new metastases in the cervical/thoracic and lumbar spine, sternum, bilateral iliac, L ischium, bilateral scapulae, multiple ribs, proximal femurs and proximal humeri, and calvarium - 04/20/14, CT A/P: aortocaval LNs subcentimeter, stable R pelvic sidewall LN 1.9cm - 04/21/14, PSA 8.41, T 43 - 05/24/14, PSA 4.02 - 06/28/14, PSA 8.75  Bladder cancer - 03/15/14, TURBT, pathology showed papillary urothelial cancer, high grade, non-invasive - 05/2014, started intravesical BCG   INTERVAL HISTORY   Marcus Blevins returns to clinic for cycle 3 docetaxel. He's tolerating treatment pretty well. Very rare nausea; compazine works well. He's taken 2 or 3 times since treatment started. Diarrhea x 2 episodes; Imodium prn. Good appetite most days. Less mouth soreness this last cycle. He thinks sucking on ice during treatment was helpful. He uses salt water rinses and MMW prn. He did not need orajel. His concern today is insomnia. He's tried and failed Tylenol PM, Melatonin, Aleve PM, Trazodone. He has tolerated Xanax in the past in other situations. He monitors BP and pulse daily. He noticed his pulse was trending up (max 112 here) and relates this to dehydration. Once he made more of an effort to drink fluids, and eat better, pulse coming down. He is having an eye exam tomorrow. Last examined about 1 year ago  but lost his glasses. Intermittent tingling in fingers, which is not worse than pre-chemo. He did experience short lived joint discomfort after last treatment. He plans to go to the beach during the next few weeks.Stable nocturia, averages 4x/night. Denies fevers, chills, night sweats, weight loss, nausea/vomiting, constipation, hematuria or dysuria IMPRESSION/ PLAN  Mr.  Blevins is an 81 yo Caucasian man with metastatic prostate cancer, presenting for further management. Sites of disease include: LNs and bone.  Encounter Diagnosis  Name Primary?  . Malignant neoplasm of prostate Yes   1) Metastatic prostate cancer, hormone sensitive: PSA pending today; jumped up 3 weeks ago with labs prior to dose#2. Tolerating treatment well. Proceed with docetaxel #3/6 with Neualsta onbody injector. Nadir labs 08/22/14. RTC 3 weeks for consideration dose #4. Restage after cycle 6. He knows to call with any concerns prior to next appointment.  2) GnRH agonist: Lupron 45mg  given in clinic 04/21/14, next due October 2016  3) Metastases to bone: Reminded importance of taking calcium/vitamin D daily.  4) Bladder urothelial carcinoma: Completed BCG treatments on 06/09/14. This is on hold until chemo completed.  5) Anemia: Mild, likely related to disease. Asymptomatic. Monitor for now. No need currently for transfusions.  6) Patient Support services. They have been introduced to [employer] previously. Daughter today did not voice specific concerns.  7) Insomnia. He's tried multiple options. Will try temazepam 7.5 mg hs prn. Hopefully tolerated well and effective. He knows he should not drive with this medication.   SIGNIFICANT EVENTS  8/12>>respiratory failure in the ED, RSI, intubated ETT 7.5    PAST MEDICAL HISTORY    :  Past Medical History  Diagnosis Date  . Cancer     prostate  . Heart disease   . Prostate cancer 2014    8 weeks of radiation, [facility]  . Hypertension   . Hyperlipidemia    Past Surgical History  Procedure Laterality Date  . Colonoscopy  2007  . Cholecystectomy  1958  . Bladder scraping  03-15-14    [facility], for bladder cancer  . Tumor removed from bladder     Prior to Admission medications   Medication Sig Start Date End Date Taking? Authorizing Provider  albuterol (PROVENTIL HFA;VENTOLIN HFA) 108 (90 BASE) MCG/ACT inhaler Inhale 2 puffs into  the lungs every 6 (six) hours as needed for wheezing. 08/03/14  Yes Birdie Sons, MD  amLODipine (NORVASC) 5 MG tablet Take 5 mg by mouth daily.   Yes Historical Provider, MD  aspirin EC 81 MG tablet Take 81 mg by mouth daily.   Yes Historical Provider, MD  atenolol (TENORMIN) 50 MG tablet Take 50 mg by mouth daily.   Yes Historical Provider, MD  atorvastatin (LIPITOR) 20 MG tablet Take 20 mg by mouth at bedtime.   Yes Historical Provider, MD  Calcium Carbonate-Vitamin D (CALCIUM 600+D) 600-400 MG-UNIT per tablet Take 1 tablet by mouth daily.   Yes Historical Provider, MD  clotrimazole-betamethasone (LOTRISONE) cream Apply 1 application topically 2 (two) times daily.   Yes Historical Provider, MD  dexamethasone (DECADRON) 4 MG tablet Take 8 mg by mouth 2 (two) times daily. Pt only takes the day before and day after DOCEtaxel.   Yes Historical Provider, MD  Diphenhyd-Hydrocort-Nystatin (FIRST-DUKES MOUTHWASH) SUSP Use as directed 30 mLs in the mouth or throat as needed (for mouth sores).   Yes Historical Provider, MD  diphenhydramine-acetaminophen (TYLENOL PM) 25-500 MG TABS Take 1 tablet by mouth at bedtime as needed (for sleep).  Yes Historical Provider, MD  fluticasone (FLONASE) 50 MCG/ACT nasal spray Place 1 spray into both nostrils daily.   Yes Historical Provider, MD  folic acid (FOLVITE) 239 MCG tablet Take 800 mcg by mouth daily.   Yes Historical Provider, MD  leuprolide, 6 Month, (ELIGARD) 45 MG injection Inject 45 mg into the skin every 6 (six) months.   Yes Historical Provider, MD  Melatonin 5 MG TABS Take 1 tablet by mouth at bedtime as needed (for sleep).    Yes Historical Provider, MD  mometasone (ASMANEX) 220 MCG/INH inhaler Inhale 1 puff into the lungs 2 (two) times daily.   Yes Historical Provider, MD  montelukast (SINGULAIR) 10 MG tablet Take 10 mg by mouth at bedtime.   Yes Historical Provider, MD  prochlorperazine (COMPAZINE) 10 MG tablet Take 10 mg by mouth every 6 (six) hours  as needed for nausea or vomiting.   Yes Historical Provider, MD  pyridOXINE (VITAMIN B-6) 100 MG tablet Take 100 mg by mouth daily.   Yes Historical Provider, MD  solifenacin (VESICARE) 10 MG tablet Take 10 mg by mouth at bedtime.   Yes Historical Provider, MD  tamsulosin (FLOMAX) 0.4 MG CAPS capsule Take 0.4 mg by mouth daily.   Yes Historical Provider, MD  temazepam (RESTORIL) 7.5 MG capsule Take 7.5 mg by mouth at bedtime as needed for sleep.   Yes Historical Provider, MD  vitamin B-12 (CYANOCOBALAMIN) 1000 MCG tablet Take 1,000 mcg by mouth daily.   Yes Historical Provider, MD  hyoscyamine (LEVSIN SL) 0.125 MG SL tablet Place 1 tablet (0.125 mg total) under the tongue every 4 (four) hours as needed. Patient not taking: Reported on 08/27/2014 07/20/14   Birdie Sons, MD   No Known Allergies   FAMILY HISTORY   Family History  Problem Relation Age of Onset  . CVA Mother   . Heart attack Father       SOCIAL HISTORY    reports that he quit smoking about 60 years ago. He has never used smokeless tobacco. He reports that he does not drink alcohol or use illicit drugs.  ROS - unable to obtain due to intubated and sedated    VITAL SIGNS    Temp:  [98.1 F (36.7 C)-100.1 F (37.8 C)] 100.1 F (37.8 C) (08/13 0730) Pulse Rate:  [95-147] 124 (08/13 0900) Resp:  [11-36] 19 (08/13 0900) BP: (87-142)/(50-80) 90/59 mmHg (08/13 0900) SpO2:  [91 %-100 %] 94 % (08/13 0900) FiO2 (%):  [35 %-89 %] 35 % (08/13 0750) Weight:  [140 lb (63.504 kg)-144 lb (65.318 kg)] 144 lb (65.318 kg) (08/12 2047) HEMODYNAMICS: CVP:  [0 mmHg] 0 mmHg VENTILATOR SETTINGS: Vent Mode:  [-] PRVC FiO2 (%):  [35 %-89 %] 35 % Set Rate:  [14 bmp] 14 bmp Vt Set:  [450 mL] 450 mL PEEP:  [5 cmH20] 5 cmH20 INTAKE / OUTPUT:  Intake/Output Summary (Last 24 hours) at 08/20/14 1016 Last data filed at 08/20/14 0600  Gross per 24 hour  Intake 1318.5 ml  Output    435 ml  Net  883.5 ml       PHYSICAL EXAM    Physical Exam GENERAL:sedated on the MV, no acute on the vent.  HEAD: Normocephalic, atraumatic.  EYES: Pupils equal, round, reactive to light. Unable to assess extraocular muscles given mental status/medical condition. No scleral icterus.  MOUTH: Moist mucosal membrane. Dentition intact. No abscess noted.  EAR, NOSE, THROAT: Clear without exudates. No external lesions.  NECK: Supple. No thyromegaly.  No nodules. No JVD.  PULMONARY: Coarse breath sounds with scattered rhonchi right lung field, intubated/mechanical ventilation CHEST: Nontender to palpation.  CARDIOVASCULAR: S1 and S2. Tachycardic No murmurs, rubs, or gallops. No edema. Pedal pulses 2+ bilaterally.  GASTROINTESTINAL: Soft, nontender, nondistended. No masses. Positive bowel sounds. No hepatosplenomegaly.  MUSCULOSKELETAL: No swelling, clubbing, or edema. Range of motion full in all extremities.  NEUROLOGIC: Unable to assess given mental status/medical condition SKIN: No ulceration, lesions, rashes, or cyanosis. Skin warm and dry. Turgor intact.  PSYCHIATRIC: Unable to assess given mental status/medical condition    LABS   LABS:  CBC  Recent Labs Lab 08/24/2014 2025 08/20/14 0137  WBC 32.9* 34.4*  HGB 9.3* 8.6*  HCT 29.3* 27.6*  PLT 196 181   Coag's  Recent Labs Lab 08/20/14 0137  APTT 36  INR 1.20   BMET  Recent Labs Lab 08/14/2014 2025 08/20/14 0137  NA 135 136  K 3.2* 3.9  CL 107 108  CO2 18* 20*  BUN 18 17  CREATININE 0.99 1.04  GLUCOSE 135* 167*   Electrolytes  Recent Labs Lab 08/20/2014 2025 08/20/14 0137  CALCIUM 7.5* 6.9*  MG  --  1.5*   Sepsis Markers  Recent Labs Lab 08/30/2014 2025 08/20/14 0003  LATICACIDVEN 4.0* 2.2*   ABG  Recent Labs Lab 08/22/2014 2123  PHART 7.33*  PCO2ART 30*  PO2ART 86   Liver Enzymes  Recent Labs Lab 08/18/2014 2025 08/20/14 0137  AST 30 38  ALT 14* 14*  ALKPHOS 118 99  BILITOT 0.4 <0.1*  ALBUMIN 3.0* 2.6*   Cardiac  Enzymes  Recent Labs Lab 08/24/2014 2025 08/20/14 0003 08/20/14 0510  TROPONINI 0.08* 1.03* 4.15*   Glucose  Recent Labs Lab 08/24/2014 2055 09/07/2014 2311  GLUCAP 143* 171*     Recent Results (from the past 240 hour(s))  MRSA PCR Screening     Status: None   Collection Time: 08/24/2014  6:02 PM  Result Value Ref Range Status   MRSA by PCR NEGATIVE NEGATIVE Final    Comment:        The GeneXpert MRSA Assay (FDA approved for NASAL specimens only), is one component of a comprehensive MRSA colonization surveillance program. It is not intended to diagnose MRSA infection nor to guide or monitor treatment for MRSA infections.   Urine culture     Status: None (Preliminary result)   Collection Time: 08/31/2014  8:25 PM  Result Value Ref Range Status   Specimen Description URINE, RANDOM  Final   Special Requests NONE  Final   Culture TOO YOUNG TO READ  Final   Report Status PENDING  Incomplete     Current facility-administered medications:  .  0.9 %  sodium chloride infusion, , Intravenous, Continuous, Theodoro Grist, MD, Last Rate: 125 mL/hr at 08/20/14 0900 .  acetaminophen (TYLENOL) tablet 650 mg, 650 mg, Oral, Q6H PRN **OR** acetaminophen (TYLENOL) suppository 650 mg, 650 mg, Rectal, Q6H PRN, Lytle Butte, MD .  aspirin EC tablet 81 mg, 81 mg, Oral, Daily, Lytle Butte, MD .  atorvastatin (LIPITOR) tablet 20 mg, 20 mg, Oral, QHS, Lytle Butte, MD, 20 mg at 08/20/14 0107 .  azithromycin (ZITHROMAX) 500 mg in dextrose 5 % 250 mL IVPB, 500 mg, Intravenous, Q24H, Theodoro Grist, MD .  budesonide (PULMICORT) nebulizer solution 0.5 mg, 0.5 mg, Nebulization, BID, Lytle Butte, MD, 0.5 mg at 08/20/14 0748 .  darifenacin (ENABLEX) 24 hr tablet 7.5 mg, 7.5 mg, Oral, Daily, Lytle Butte,  MD .  fentaNYL 2560mcg in NS 259mL (12mcg/ml) infusion-PREMIX, 20 mcg/hr, Intravenous, Continuous, Lisa Roca, MD, Last Rate: 10 mL/hr at 08/20/14 0900, 100 mcg/hr at 08/20/14 0900 .  furosemide  (LASIX) injection 20 mg, 20 mg, Intravenous, Once, Theodoro Grist, MD .  heparin ADULT infusion 100 units/mL (25000 units/250 mL), 800 Units/hr, Intravenous, Continuous, Lytle Butte, MD, Last Rate: 8 mL/hr at 08/20/14 0900, 800 Units/hr at 08/20/14 0900 .  ipratropium-albuterol (DUONEB) 0.5-2.5 (3) MG/3ML nebulizer solution 3 mL, 3 mL, Nebulization, Q4H, Lytle Butte, MD, 3 mL at 08/20/14 0748 .  midazolam (VERSED) 50 mg in sodium chloride 0.9 % 50 mL (1 mg/mL) infusion, 2 mg/hr, Intravenous, Continuous, Ruby Cola, MD, Stopped at 08/20/14 0900 .  montelukast (SINGULAIR) tablet 10 mg, 10 mg, Oral, QHS, Lytle Butte, MD, 10 mg at 08/20/14 0108 .  morphine 2 MG/ML injection 2 mg, 2 mg, Intravenous, Q4H PRN, Lytle Butte, MD .  norepinephrine (LEVOPHED) 4mg  in D5W 260mL premix infusion, 0-40 mcg/min, Intravenous, Titrated, Theodoro Grist, MD .  ondansetron (ZOFRAN) tablet 4 mg, 4 mg, Oral, Q6H PRN **OR** ondansetron (ZOFRAN) injection 4 mg, 4 mg, Intravenous, Q6H PRN, Lytle Butte, MD .  oxyCODONE (Oxy IR/ROXICODONE) immediate release tablet 5 mg, 5 mg, Oral, Q4H PRN, Lytle Butte, MD .  piperacillin-tazobactam (ZOSYN) IVPB 4.5 g, 4.5 g, Intravenous, 3 times per day, Lytle Butte, MD, 4.5 g at 08/20/14 0151 .  sodium chloride 0.9 % injection 3 mL, 3 mL, Intravenous, Q12H, Lytle Butte, MD, 3 mL at 08/29/2014 2100 .  tamsulosin (FLOMAX) capsule 0.4 mg, 0.4 mg, Oral, Daily, Lytle Butte, MD .  temazepam (RESTORIL) capsule 7.5 mg, 7.5 mg, Oral, QHS PRN, Lytle Butte, MD .  vancomycin (VANCOCIN) IVPB 1000 mg/200 mL premix, 1,000 mg, Intravenous, Q18H, Lytle Butte, MD, 1,000 mg at 08/20/14 0538  IMAGING    Dg Chest 2 View  08/18/2014   CLINICAL DATA:  Fever, chills, cough. History of prostate cancer, receiving chemotherapy at [facility]. On Levaquin for sinus infection.  EXAM: CHEST  2 VIEW  COMPARISON:  07/06/2014.  FINDINGS: Right middle lobe opacity, overlying the heart on the lateral view,  suspicious for pneumonia. Left basilar opacity, atelectasis versus pneumonia. No pleural effusion or pneumothorax.  The heart is normal in size.  Degenerative changes of the visualized thoracolumbar spine.  IMPRESSION: Right middle lobe pneumonia.  Left lower lobe opacity, atelectasis versus pneumonia.   Electronically Signed   By: Julian Hy M.D.   On: 08/18/2014 18:58   Dg Abd 1 View  08/20/2014   CLINICAL DATA:  Orogastric tube placement.  Initial encounter.  EXAM: ABDOMEN - 1 VIEW  COMPARISON:  None.  FINDINGS: The patient's enteric tube is seen ending overlying the antrum of the stomach.  The visualized bowel gas pattern is grossly unremarkable. No free intra-abdominal air is seen, though evaluation for free air is limited on a single supine view.  There is an unusual sclerotic appearance to the lumbar spine, with suggestion of scattered sclerotic lesions within the sacrum and ilium, concerning for metastatic disease.  IMPRESSION: 1. Enteric tube noted ending overlying the antrum of the stomach. 2. Visualized sclerotic lesions throughout the osseous structures raises concern for metastatic disease from the patient's known prostate primary.   Electronically Signed   By: Garald Balding M.D.   On: 08/20/2014 02:09   Dg Chest Port 1 View  08/18/2014   CLINICAL DATA:  Endotracheal tube  placement.  Initial encounter.  EXAM: PORTABLE CHEST - 1 VIEW  COMPARISON:  Chest radiograph performed earlier today at 6:07 p.m.  FINDINGS: The patient's endotracheal tube is seen ending 5 cm above the carina.  The lungs are well-aerated. Bilateral central and bibasilar airspace opacities raise concern for mild pulmonary edema, though pneumonia could have a similar appearance. Underlying vascular congestion is noted. No definite pleural effusion or pneumothorax is seen. The left costophrenic angle is incompletely imaged on this study.  The cardiomediastinal silhouette is borderline normal in size. No acute osseous  abnormalities are seen.  IMPRESSION: 1. Endotracheal tube seen ending 5 cm above the carina. 2. Bilateral central and bibasilar airspace opacities, new from the prior study, raise concern for pulmonary edema, though pneumonia could have a similar appearance. Underlying vascular congestion seen.   Electronically Signed   By: Garald Balding M.D.   On: 09/07/2014 21:38                         ASSESSMENT/PLAN  81 yo male admitted for sepsis, respiratory failure, intubated, past medical history of prostate cancer currently receiving chemotherapy  PULMONARY  A: Acute respiratory failure Pulmonary edema Respiratory failure requiring mechanical ventilation Ventilator dyssynchrony P:   Continue with mechanical ventilation, wean as tolerated, assess for SBT daily Sedation/analgesia-fentanyl/Versed, will continue with this regimen currently. Patient noted to have intubated the synchrony, apnea, abnormal triggering of the event, we'll start vecuronium 10 mg IV every hour when necessary for dyssynchrony Repeat chest x-ray today Follow-up respiratory cultures Patient with sepsis, source unidentified at this time, may require bronchoscopy with BAL if clinical deterioration continues. Check ABG  CARDIOVASCULAR CVL - none A:  Elevated troponin Non-STEMI P:  Follow-up cardiac enzymes Continue with heparin drip Cardiology consultation Possibly demand ischemia from respiratory failure   RENAL A:   Electrolyte abnormalities  P:   Replacement at electrolytes per ICU protocol Monitor I/O Keep fluid level balance Lasix as needed given the presence of vascular congestion on recent chest x-ray  GASTROINTESTINAL   P:   Continue with PPI for stress ulcer prophylaxis Dietary consult Start tube feeds  HEMATOLOGIC A:   Leukocytosis Sepsis P:  Elevated white blood count possibly due to sepsis versus leukemoid reaction Continue to monitor CBC Continue with current  antibiotics   INFECTIOUS A:  Sepsis P:   BCx2 8/12>> UC 8/12>> Sputum 8/12>> Abx: Vanc/Zosyn, start date 8/12, day 1/  NEUROLOGIC P:  Intubated and sedated RASS goal: -1 to -2 Currently on fentanyl and Versed, may change Versed to propofol if needed    I have personally obtained a history, examined the patient, evaluated laboratory and imaging results, formulated the assessment and plan and placed orders.  The Patient requires high complexity decision making for assessment and support, frequent evaluation and titration of therapies, application of advanced monitoring technologies and extensive interpretation of multiple databases. Critical Care Time devoted to patient care services described in this note is 45 minutes.   Overall, patient is critically ill, prognosis is guarded. Patient at high risk for cardiac arrest and death.   Vilinda Boehringer, MD [facility] Pager [phone] (Please enter [phone])     08/20/2014, 10:16 AM

## 2014-08-20 NOTE — Progress Notes (Addendum)
[facility] NAME: Marcus Blevins    MR#:  [id number]  DATE OF BIRTH:  1927-03-19  SUBJECTIVE:  CHIEF COMPLAINT:   Chief Complaint  Patient presents with  . Fever  . Chills  . Cough   patient is a 81 year old Caucasian male with past medical history significant for history of metastatic prostate cancer who is undergoing chemotherapy who presents to the hospital with fevers, chills and cough. He deteriorated. In emergency room and he was intubated. Per family members. Patient has been having cough for approximately 3 days which was nonproductive. Chest x-ray done in the emergency room revealed left lower lobe opacity, however, subsequent x-rays revealed congestive heart failure. Patient is hypotensive and tachycardic. He is in A. fib. according to cardiologist, Dr. Ubaldo Glassing. Patient has been managed on vancomycin and Zosyn and Zithromax was added. Patient's wife is at bedside and I had prolonged discussion with her about patient's condition, all questions were answered and patient's wife voiced understanding  Review of Systems  Unable to perform ROS: intubated    VITAL SIGNS: Blood pressure 68/50, pulse 105, temperature 98.3 F (36.8 C), temperature source Oral, resp. rate 18, height 5\' 7"  (1.702 m), weight 65.318 kg (144 lb), SpO2 92 %.  PHYSICAL EXAMINATION:   GENERAL:  81 y.o.-year-old patient lying in the bed in mild respiratory distress, he is intubated, however, opens his eyes and sometimes fights the vent.  EYES: Pupils equal, round, reactive to light and accommodation. No scleral icterus. Extraocular muscles intact.  HEENT: Head atraumatic, normocephalic. Oropharynx and nasopharynx clear. ET tube is in place as well as OG tube NECK:  Supple, no jugular venous distention. No thyroid enlargement, no tenderness.  LUNGS: Normal breath sounds bilaterally, no wheezing, numerous rales and  Crepitations noted bilaterally in both lung  fields, more on the left. Intermittently uses accessory muscles of respiration.  CARDIOVASCULAR: S1, S2 normal. No murmurs, rubs, or gallops.  ABDOMEN: Soft, nontender, nondistended. Bowel sounds present. No organomegaly or mass.  EXTREMITIES: No pedal edema, cyanosis, or clubbing.  NEUROLOGIC: Cranial nerves II through XII are grossly intact, but difficult to examine since patient is on the vent. Muscle strength not able to evaluate. Sensation not able to assess. Gait not checked.  PSYCHIATRIC: The patient is somnolent, sedated intermittently opens his eyes.  SKIN: No obvious rash, lesion, or ulcer.   ORDERS/RESULTS REVIEWED:   CBC  Recent Labs Lab 08/23/2014 2025 08/20/14 0137  WBC 32.9* 34.4*  HGB 9.3* 8.6*  HCT 29.3* 27.6*  PLT 196 181  MCV 83.2 82.6  MCH 26.4 25.8*  MCHC 31.8* 31.2*  RDW 19.4* 19.2*  LYMPHSABS 0.3*  --   MONOABS 0.0*  --   EOSABS 0.0  --   BASOSABS 0.0  --    ------------------------------------------------------------------------------------------------------------------  Chemistries   Recent Labs Lab 09/03/2014 2025 08/20/14 0137 08/20/14 1037  NA 135 136  --   K 3.2* 3.9  --   CL 107 108  --   CO2 18* 20*  --   GLUCOSE 135* 167*  --   BUN 18 17  --   CREATININE 0.99 1.04  --   CALCIUM 7.5* 6.9*  --   MG  --  1.5* 2.2  AST 30 38  --   ALT 14* 14*  --   ALKPHOS 118 99  --   BILITOT 0.4 <0.1*  --    ------------------------------------------------------------------------------------------------------------------ estimated creatinine clearance is 48 mL/min (by C-G formula  based on Cr of 1.04). ------------------------------------------------------------------------------------------------------------------ No results for input(s): TSH, T4TOTAL, T3FREE, THYROIDAB in the last 72 hours.  Invalid input(s): FREET3  Cardiac Enzymes  Recent Labs Lab 08/20/14 0003 08/20/14 0510 08/20/14 1037  TROPONINI 1.03* 4.15* 6.44*    ------------------------------------------------------------------------------------------------------------------ Invalid input(s): POCBNP ---------------------------------------------------------------------------------------------------------------  RADIOLOGY: Dg Chest 2 View  08/24/2014   CLINICAL DATA:  Fever, chills, cough. History of prostate cancer, receiving chemotherapy at [facility]. On Levaquin for sinus infection.  EXAM: CHEST  2 VIEW  COMPARISON:  07/06/2014.  FINDINGS: Right middle lobe opacity, overlying the heart on the lateral view, suspicious for pneumonia. Left basilar opacity, atelectasis versus pneumonia. No pleural effusion or pneumothorax.  The heart is normal in size.  Degenerative changes of the visualized thoracolumbar spine.  IMPRESSION: Right middle lobe pneumonia.  Left lower lobe opacity, atelectasis versus pneumonia.   Electronically Signed   By: [facility] Hy M.D.   On: 09/02/2014 18:58   Dg Abd 1 View  08/20/2014   CLINICAL DATA:  Orogastric tube placement.  Initial encounter.  EXAM: ABDOMEN - 1 VIEW  COMPARISON:  None.  FINDINGS: The patient's enteric tube is seen ending overlying the antrum of the stomach.  The visualized bowel gas pattern is grossly unremarkable. No free intra-abdominal air is seen, though evaluation for free air is limited on a single supine view.  There is an unusual sclerotic appearance to the lumbar spine, with suggestion of scattered sclerotic lesions within the sacrum and ilium, concerning for metastatic disease.  IMPRESSION: 1. Enteric tube noted ending overlying the antrum of the stomach. 2. Visualized sclerotic lesions throughout the osseous structures raises concern for metastatic disease from the patient's known prostate primary.   Electronically Signed   By: Garald Balding M.D.   On: 08/20/2014 02:09   Dg Chest Port 1 View  08/17/2014   CLINICAL DATA:  Endotracheal tube placement.  Initial encounter.  EXAM: PORTABLE CHEST - 1 VIEW   COMPARISON:  Chest radiograph performed earlier today at 6:07 p.m.  FINDINGS: The patient's endotracheal tube is seen ending 5 cm above the carina.  The lungs are well-aerated. Bilateral central and bibasilar airspace opacities raise concern for mild pulmonary edema, though pneumonia could have a similar appearance. Underlying vascular congestion is noted. No definite pleural effusion or pneumothorax is seen. The left costophrenic angle is incompletely imaged on this study.  The cardiomediastinal silhouette is borderline normal in size. No acute osseous abnormalities are seen.  IMPRESSION: 1. Endotracheal tube seen ending 5 cm above the carina. 2. Bilateral central and bibasilar airspace opacities, new from the prior study, raise concern for pulmonary edema, though pneumonia could have a similar appearance. Underlying vascular congestion seen.   Electronically Signed   By: Garald Balding M.D.   On: 08/31/2014 21:38    EKG:  Orders placed or performed during the hospital encounter of 08/26/2014  . ED EKG 12-Lead  . ED EKG 12-Lead    ASSESSMENT AND PLAN:  Active Problems:   Severe sepsis   Acute respiratory failure with hypoxia   A-fib 1. Severe sepsis due to left lower lobe pneumonia, continue antibiotics, vancomycin and Zosyn added Zithromax , get tracheal aspirate for cultures, follow culture results 2. Left lower lobe pneumonia, as above. Broad-spectrum antibiotic therapy. Cultures. Adjust antibiotics depending on culture results 3. Acute pulmonary edema due to congestive heart failure likely due to fluid overload during resuscitation, Echocardiogram. Try diuresis if patient's blood pressure tolerates, decrease IV fluid rate 4. Septic shock. Initiate patient on  levo fed keeping map is around 65 and above 5. Non-Q-wave MI, type II, likely due to demand ischemia. Echocardiogram is pending. Cardiology consultation is appreciated. Unable to use metoprolol on nitroglycerin due to hypotension, septic  shock. Patient was continued on a heparin IV drip. However, now changed to Eliquis by cardiologist 6. Acute respiratory failure due to pneumonia as well as acute pulmonary edema. Continue mechanical ventilation. Get pulmonary consultation 7. Leukocytosis. Follow with antibiotic therapy 8. Lactic metabolic acidosis likely due to poor peripheral perfusion. Patient needs central line placed as well, as started on levo fed to keep his map is around 65 and above to improve peripheral perfusion 9. Atrial fibrillation, atrial flutter. On arrival, get EKG repeated to rule out A. fib. If patient remains in A. fib, we would initiate him him on amiodarone, although I suspected tachycardia could be due to acidosis, will also start patient on low rate Bicarbonate drip   Management plans discussed with the patient, family and they are in agreement.   DRUG ALLERGIES: No Known Allergies  CODE STATUS:     Code Status Orders        Start     Ordered   08/23/2014 2127  Full code   Continuous     08/17/2014 2127    Advance Directive Documentation        Most Recent Value   Type of Advance Directive  Healthcare Power of Attorney, Living will   Pre-existing out of facility DNR order (yellow form or pink MOST form)     "MOST" Form in Place?        TOTAL TIME TAKING CARE OF THIS PATIENT: 80 minutes.  Discussed with patient's wife, questions answered, voiced understanding  Kemar Pandit M.D on 08/20/2014 at 2:52 PM  Between 7am to 6pm - Pager - [phone]  After 6pm go to [email or web address] - password [email or web address] [facility]  Office  [phone]  CC: Primary care physician; Lelon Huh, MD

## 2014-08-20 NOTE — Progress Notes (Signed)
ANTICOAGULATION CONSULT NOTE - Initial Consult  Pharmacy Consult for heparin drip Indication:ACS/ STEMI  No Known Allergies  Patient Measurements: Height: 5\' 7"  (170.2 cm) Weight: 144 lb (65.318 kg) IBW/kg (Calculated) : 66.1 Heparin Dosing Weight: 65kg  Vital Signs: Temp: 98.4 F (36.9 C) (08/12 2310) Temp Source: Oral (08/12 1758) BP: 88/55 mmHg (08/13 0300) Pulse Rate: 105 (08/13 0300)  Labs:  Recent Labs  08/31/2014 2025 08/20/14 0003 08/20/14 0137  HGB 9.3*  --  8.6*  HCT 29.3*  --  27.6*  PLT 196  --  181  APTT  --   --  36  LABPROT  --   --  15.4*  INR  --   --  1.20  CREATININE 0.99  --  1.04  TROPONINI 0.08* 1.03*  --     Estimated Creatinine Clearance: 48 mL/min (by C-G formula based on Cr of 1.04).   Medical History: Past Medical History  Diagnosis Date  . Cancer     prostate  . Heart disease   . Prostate cancer 2014    8 weeks of radiation, [facility]  . Hypertension   . Hyperlipidemia     Medications:    Assessment: Hgb 9.6  plt 181 aPTT  36   INR 1.2  Goal of Therapy:  Heparin level 0.3-0.7 units/ml Monitor platelets by anticoagulation protocol: Yes   Plan:  4000 unit bolus and initial rate of 800 units/hr. First anti-Xa 8 hours after start of infusion.    Sim Boast, PharmD, BCPS  08/20/2014

## 2014-08-21 ENCOUNTER — Inpatient Hospital Stay: Payer: Medicare Other

## 2014-08-21 DIAGNOSIS — E876 Hypokalemia: Secondary | ICD-10-CM

## 2014-08-21 DIAGNOSIS — I959 Hypotension, unspecified: Secondary | ICD-10-CM

## 2014-08-21 LAB — BLOOD GAS, ARTERIAL
ACID-BASE DEFICIT: 7.5 mmol/L — AB (ref 0.0–2.0)
Acid-base deficit: 8.9 mmol/L — ABNORMAL HIGH (ref 0.0–2.0)
Allens test (pass/fail): POSITIVE — AB
BICARBONATE: 18.2 meq/L — AB (ref 21.0–28.0)
Bicarbonate: 19.5 mEq/L — ABNORMAL LOW (ref 21.0–28.0)
FIO2: 60
FIO2: 0.6
MECHANICAL RATE: 14
MECHANICAL RATE: 20
MECHVT: 450 mL
MECHVT: 530 mL
O2 SAT: 98.5 %
O2 Saturation: 91.1 %
PATIENT TEMPERATURE: 37
PCO2 ART: 51 mmHg — AB (ref 32.0–48.0)
PEEP: 8 cmH2O
PO2 ART: 76 mmHg — AB (ref 83.0–108.0)
Patient temperature: 37
pCO2 arterial: 37 mmHg (ref 32.0–48.0)
pH, Arterial: 7.19 — CL (ref 7.350–7.450)
pH, Arterial: 7.3 — ABNORMAL LOW (ref 7.350–7.450)
pO2, Arterial: 126 mmHg — ABNORMAL HIGH (ref 83.0–108.0)

## 2014-08-21 LAB — BASIC METABOLIC PANEL
Anion gap: 7 (ref 5–15)
Anion gap: 6 (ref 5–15)
BUN: 24 mg/dL — AB (ref 6–20)
BUN: 21 mg/dL — AB (ref 6–20)
CALCIUM: 6.1 mg/dL — AB (ref 8.9–10.3)
CHLORIDE: 106 mmol/L (ref 101–111)
CO2: 21 mmol/L — AB (ref 22–32)
CO2: 20 mmol/L — ABNORMAL LOW (ref 22–32)
CREATININE: 1.93 mg/dL — AB (ref 0.61–1.24)
CREATININE: 1.71 mg/dL — AB (ref 0.61–1.24)
Calcium: 6 mg/dL — CL (ref 8.9–10.3)
Chloride: 106 mmol/L (ref 101–111)
GFR calc Af Amer: 35 mL/min — ABNORMAL LOW (ref >60)
GFR calc non Af Amer: 30 mL/min — ABNORMAL LOW (ref >60)
GFR calc non Af Amer: 35 mL/min — ABNORMAL LOW (ref >60)
GFR, EST AFRICAN AMERICAN: 40 mL/min — AB (ref >60)
GLUCOSE: 197 mg/dL — AB (ref 65–99)
Glucose, Bld: 214 mg/dL — ABNORMAL HIGH (ref 65–99)
POTASSIUM: 3.5 mmol/L (ref 3.5–5.1)
Potassium: 3.7 mmol/L (ref 3.5–5.1)
SODIUM: 132 mmol/L — AB (ref 135–145)
Sodium: 134 mmol/L — ABNORMAL LOW (ref 135–145)

## 2014-08-21 LAB — APTT: APTT: 51 s — AB (ref 24–36)

## 2014-08-21 LAB — GLUCOSE, CAPILLARY
GLUCOSE-CAPILLARY: 181 mg/dL — AB (ref 65–99)
GLUCOSE-CAPILLARY: 220 mg/dL — AB (ref 65–99)
GLUCOSE-CAPILLARY: 201 mg/dL — AB (ref 65–99)
GLUCOSE-CAPILLARY: 197 mg/dL — AB (ref 65–99)
Glucose-Capillary: 157 mg/dL — ABNORMAL HIGH (ref 65–99)

## 2014-08-21 LAB — CBC
HCT: 23.7 % — ABNORMAL LOW (ref 40.0–52.0)
Hemoglobin: 7.5 g/dL — ABNORMAL LOW (ref 13.0–18.0)
MCH: 26.2 pg (ref 26.0–34.0)
MCHC: 31.7 g/dL — AB (ref 32.0–36.0)
MCV: 82.6 fL (ref 80.0–100.0)
PLATELETS: 145 10*3/uL — AB (ref 150–440)
RBC: 2.87 MIL/uL — ABNORMAL LOW (ref 4.40–5.90)
RDW: 19.4 % — ABNORMAL HIGH (ref 11.5–14.5)
WBC: 21.8 10*3/uL — AB (ref 3.8–10.6)

## 2014-08-21 LAB — PROTIME-INR
INR: 1.93
Prothrombin Time: 22.2 seconds — ABNORMAL HIGH (ref 11.4–15.0)

## 2014-08-21 LAB — VANCOMYCIN, RANDOM: VANCOMYCIN RM: 14 ug/mL

## 2014-08-21 LAB — LACTIC ACID, PLASMA: LACTIC ACID, VENOUS: 1.6 mmol/L (ref 0.5–2.0)

## 2014-08-21 LAB — HEPARIN LEVEL (UNFRACTIONATED): HEPARIN UNFRACTIONATED: 2 [IU]/mL — AB (ref 0.30–0.70)

## 2014-08-21 MED ORDER — SODIUM CHLORIDE 0.9 % IV SOLN
INTRAVENOUS | Status: DC
  Administered 2014-08-22: 75 mL/h via INTRAVENOUS

## 2014-08-21 MED ORDER — HEPARIN BOLUS VIA INFUSION
4000.0000 [IU] | Freq: Once | INTRAVENOUS | Status: AC
  Administered 2014-08-21: 4000 [IU] via INTRAVENOUS
  Filled 2014-08-21: qty 4000

## 2014-08-21 MED ORDER — PIPERACILLIN-TAZOBACTAM 3.375 G IVPB
3.3750 g | Freq: Two times a day (BID) | INTRAVENOUS | Status: DC
  Administered 2014-08-21 – 2014-08-22 (×2): 3.375 g via INTRAVENOUS
  Filled 2014-08-21 (×6): qty 50

## 2014-08-21 MED ORDER — HEPARIN (PORCINE) IN NACL 100-0.45 UNIT/ML-% IJ SOLN
800.0000 [IU]/h | INTRAMUSCULAR | Status: DC
  Administered 2014-08-21: 800 [IU]/h via INTRAVENOUS
  Filled 2014-08-21 (×2): qty 250

## 2014-08-21 MED ORDER — CALCIUM GLUCONATE 10 % IV SOLN
1.0000 g | Freq: Once | INTRAVENOUS | Status: AC
  Administered 2014-08-21: 1 g via INTRAVENOUS
  Filled 2014-08-21: qty 10

## 2014-08-21 MED ORDER — POTASSIUM CHLORIDE 20 MEQ PO PACK
40.0000 meq | PACK | Freq: Once | ORAL | Status: AC
  Administered 2014-08-21: 40 meq
  Filled 2014-08-21: qty 2

## 2014-08-21 MED ORDER — VANCOMYCIN HCL IN DEXTROSE 1-5 GM/200ML-% IV SOLN
1000.0000 mg | INTRAVENOUS | Status: DC
  Filled 2014-08-21 (×2): qty 200

## 2014-08-21 MED ORDER — INSULIN ASPART 100 UNIT/ML ~~LOC~~ SOLN
2.0000 [IU] | SUBCUTANEOUS | Status: DC
  Administered 2014-08-21: 4 [IU] via SUBCUTANEOUS
  Administered 2014-08-21 (×2): 6 [IU] via SUBCUTANEOUS
  Administered 2014-08-21 – 2014-08-22 (×4): 4 [IU] via SUBCUTANEOUS
  Administered 2014-08-22: 2 [IU] via SUBCUTANEOUS
  Administered 2014-08-22 (×2): 6 [IU] via SUBCUTANEOUS
  Filled 2014-08-21: qty 1
  Filled 2014-08-21: qty 2
  Filled 2014-08-21 (×2): qty 4
  Filled 2014-08-21: qty 6
  Filled 2014-08-21 (×2): qty 4
  Filled 2014-08-21: qty 6
  Filled 2014-08-21: qty 4
  Filled 2014-08-21 (×2): qty 6

## 2014-08-21 MED ORDER — VANCOMYCIN HCL IN DEXTROSE 1-5 GM/200ML-% IV SOLN
1000.0000 mg | INTRAVENOUS | Status: DC
  Administered 2014-08-21: 1000 mg via INTRAVENOUS
  Filled 2014-08-21 (×4): qty 200

## 2014-08-21 MED ORDER — APIXABAN 5 MG PO TABS: 2.5000 mg | ORAL_TABLET | Freq: Two times a day (BID) | ORAL | Status: DC

## 2014-08-21 NOTE — Consult Note (Signed)
[place]  CARDIOLOGY CONSULT NOTE  Patient ID: Marcus Blevins MRN: [id number] DOB/AGE: 02-11-1927 81 y.o.  Admit date: 08/21/2014 Referring Physician Dr. Ether Griffins Primary Physician   Primary Cardiologist [facility] Reason for Consultation \\Abnormal  troponin HPI: Pt is an 81 yo male with history of prostate ca who was admitted from er withi respiratory failure. . Also has ckd iii with serum creatinine of 1.63. Cardiac history includes cad with cardiac cath inj 1998 revealilng 75% mid lad, 75% d1, 75% om-1, 75% ostial rca, 75% proximal rca followed by a 100% proximal and 95% mid rca. Functional study revealed ef of 47% with iinfarct in inferoposterior walls.  Treated medically . Has been doing well recently from cardiac standpoint and has not had follow up at [facility] 2014. Echo at that time showed near normal ef. Has been trated with chemotherapy for prostate ca and haqs recently been treate with levaquin for a cough and fever. He was sent to er by oncologist for sob and was emergently intubated. CXR showed bilateral opacities and pulmonary edema. Serum troponins has elevated to 6.44. He is not able to given history at present. EKG showed siniuys tachcyardia with no obviious ischemia.   ROS Review of Systems - History obtained from chart review and unobtainable from patient due to mental status General ROS: positive for  - chills and fatigue Respiratory ROS: positive for - shortness of breath Cardiovascular ROS: negative for - chest pain Gastrointestinal ROS: no abdominal pain, change in bowel habits, or black or bloody stools Neurological ROS: no TIA or stroke symptoms   Past Medical History  Diagnosis Date  . Cancer     prostate  . Heart disease   . Prostate cancer 2014    8 weeks of radiation, [facility]  . Hypertension   . Hyperlipidemia     Family History  Problem Relation Age of Onset  . CVA Mother   . Heart attack Father     Social History    Social History  . Marital Status: Married    Spouse Name: N/A  . Number of Children: N/A  . Years of Education: N/A   Occupational History  . Not on file.   Social History Main Topics  . Smoking status: Former Smoker -- 1.00 packs/day for 20 years    Quit date: 01/07/1954  . Smokeless tobacco: Never Used  . Alcohol Use: No  . Drug Use: No  . Sexual Activity: Not on file   Other Topics Concern  . Not on file   Social History Narrative    Past Surgical History  Procedure Laterality Date  . Colonoscopy  2007  . Cholecystectomy  1958  . Bladder scraping  03-15-14    [facility], for bladder cancer  . Tumor removed from bladder       Prescriptions prior to admission  Medication Sig Dispense Refill Last Dose  . albuterol (PROVENTIL HFA;VENTOLIN HFA) 108 (90 BASE) MCG/ACT inhaler Inhale 2 puffs into the lungs every 6 (six) hours as needed for wheezing. 1 Inhaler 1 09/05/2014 at Unknown time  . amLODipine (NORVASC) 5 MG tablet Take 5 mg by mouth daily.   08/28/2014 at Unknown time  . aspirin EC 81 MG tablet Take 81 mg by mouth daily.   08/09/2014 at Unknown time  . atenolol (TENORMIN) 50 MG tablet Take 50 mg by mouth daily.   08/29/2014 at 1030  . atorvastatin (LIPITOR) 20 MG tablet Take 20 mg by mouth at bedtime.  08/18/2014 at Unknown time  . Calcium Carbonate-Vitamin D (CALCIUM 600+D) 600-400 MG-UNIT per tablet Take 1 tablet by mouth daily.   09/06/2014 at Unknown time  . clotrimazole-betamethasone (LOTRISONE) cream Apply 1 application topically 2 (two) times daily.   08/14/2014 at Unknown time  . dexamethasone (DECADRON) 4 MG tablet Take 8 mg by mouth 2 (two) times daily. Pt only takes the day before and day after DOCEtaxel.   Past Week at Unknown time  . Diphenhyd-Hydrocort-Nystatin (FIRST-DUKES MOUTHWASH) SUSP Use as directed 30 mLs in the mouth or throat as needed (for mouth sores).   PRN at PRN  . diphenhydramine-acetaminophen (TYLENOL PM) 25-500 MG TABS Take 1 tablet by mouth at  bedtime as needed (for sleep).   08/18/2014 at Unknown time  . fluticasone (FLONASE) 50 MCG/ACT nasal spray Place 1 spray into both nostrils daily.   08/09/2014 at Unknown time  . folic acid (FOLVITE) 010 MCG tablet Take 800 mcg by mouth daily.   08/12/2014 at Unknown time  . leuprolide, 6 Month, (ELIGARD) 45 MG injection Inject 45 mg into the skin every 6 (six) months.   April at unknown   . Melatonin 5 MG TABS Take 1 tablet by mouth at bedtime as needed (for sleep).    08/18/2014 at Unknown time  . mometasone (ASMANEX) 220 MCG/INH inhaler Inhale 1 puff into the lungs 2 (two) times daily.   08/14/2014 at Unknown time  . montelukast (SINGULAIR) 10 MG tablet Take 10 mg by mouth at bedtime.   08/18/2014 at Unknown time  . prochlorperazine (COMPAZINE) 10 MG tablet Take 10 mg by mouth every 6 (six) hours as needed for nausea or vomiting.   08/22/2014 at Unknown time  . pyridOXINE (VITAMIN B-6) 100 MG tablet Take 100 mg by mouth daily.   08/15/2014 at Unknown time  . solifenacin (VESICARE) 10 MG tablet Take 10 mg by mouth at bedtime.   08/18/2014 at Unknown time  . tamsulosin (FLOMAX) 0.4 MG CAPS capsule Take 0.4 mg by mouth daily.   08/21/2014 at Unknown time  . temazepam (RESTORIL) 7.5 MG capsule Take 7.5 mg by mouth at bedtime as needed for sleep.   08/18/2014 at Unknown time  . vitamin B-12 (CYANOCOBALAMIN) 1000 MCG tablet Take 1,000 mcg by mouth daily.   08/10/2014 at Unknown time  . hyoscyamine (LEVSIN SL) 0.125 MG SL tablet Place 1 tablet (0.125 mg total) under the tongue every 4 (four) hours as needed. (Patient not taking: Reported on 09/02/2014) 30 tablet 0     Physical Exam: Blood pressure 93/53, pulse 92, temperature 97.9 F (36.6 C), temperature source Axillary, resp. rate 20, height 5\' 6"  (1.676 m), weight 72.2 kg (159 lb 2.8 oz), SpO2 98 %.    General appearance: intubated and sedated Resp: diminished breath sounds bilaterally Cardio: sinus tachcyardia GI: soft, non-tender; bowel sounds normal; no  masses,  no organomegaly Extremities: edema 1-2+ edema bilaterally Neurologic: Mental status: Alert, oriented, thought content appropriate, alertness: intubated and ssedated Labs:   Lab Results  Component Value Date   WBC 21.8* 08/21/2014   HGB 7.5* 08/21/2014   HCT 23.7* 08/21/2014   MCV 82.6 08/21/2014   PLT 145* 08/21/2014    Recent Labs Lab 08/20/14 1636 08/21/14 0905  NA 136 134*  K 4.4 3.5  CL 108 106  CO2 22 21*  BUN 19 24*  CREATININE 1.63* 1.93*  CALCIUM 6.5* 6.0*  PROT 5.4*  --   BILITOT 0.4  --   ALKPHOS 97  --  ALT 17  --   AST 60*  --   GLUCOSE 137* 197*   Lab Results  Component Value Date   TROPONINI 6.44* 08/20/2014      Radiology: RML pna. No chf EKG: sinus tachycardia  ASSESSMENT AND PLAN:  Pt with historyh of cad treated medically with echo during this admission showing ef of 30-35%, who is being treated for prostate ca with chemotherapy admissied with respiratory failure. RML PNA by cxr. Currently is hypotensive after sedation for intubation . Improving with pressors. Elevated troponin suggests nstemi.EKG initially suggested afib but currently in sinus tachycardia.  This is likely secondary to the demand with hypoxia and pna as presenting picture was more consisant with this than acute acs aas precipitaiting event. Would stop eliquisfor now and aggressivley atgtempt treatement of pna and respiratory failure. Not candidate for invasive evaluation at present given comorbid condition but will need reconsdiered pending course. Continue with emperic abx.. Not beta blocker candidate at present due to hypotension. Asa as tolerated.  Will add iv heparin and follow for evidence of bleeding.  Signed: Teodoro Spray MD, [facility] 08/21/2014, 11:10 AM

## 2014-08-21 NOTE — Progress Notes (Signed)
[facility] [place] NAME: Marcus Blevins    MR#:  [id number]  DATE OF BIRTH:  [birth year]  SUBJECTIVE:  CHIEF COMPLAINT:   Chief Complaint  Patient presents with  . Fever  . Chills  . Cough   patient is a 81 year old Caucasian male with past medical history significant for history of metastatic prostate cancer who is undergoing chemotherapy who presents to the hospital with fevers, chills and cough. He deteriorated. In emergency room and he was intubated. Per family members. Patient has been having cough for approximately 3 days which was nonproductive. Chest x-ray done in the emergency room revealed left lower lobe opacity, however, subsequent x-rays revealed congestive heart failure. Patient was managed on fluid restriction and pressors. Patient is on Levothroid at 8 mics present. His urine output is satisfactory and his repeat the chest x-ray today showed that right base medially infiltrate, so small pleural effusions but no pulmonary edema. His kidney function deteriorated and his creatinine is 1.9 today. The pressure remains marginal. Patient is status post PICC line placement yesterday on 08/20/2014. to  Right arm. No review of systems as patient is intubated. She remains in atrial fibrillation with rate of 90-100 Review of Systems  Unable to perform ROS: intubated    VITAL SIGNS: Blood pressure 108/59, pulse 88, temperature 97.9 F (36.6 C), temperature source Axillary, resp. rate 20, height 5\' 6"  (1.676 m), weight 72.2 kg (159 lb 2.8 oz), SpO2 99 %.  PHYSICAL EXAMINATION:   GENERAL:  81 y.o.-year-old patient lying in the bed in mild respiratory distress, he is intubated, sedated, on numerous medications quitting Precedex . Pupils are pinpoint. Cornell reflexes present EYES: Pupils equal, round, reactive to light and accommodation. No scleral icterus. Extraocular muscles intact.  HEENT: Head atraumatic, normocephalic. Oropharynx and  nasopharynx clear. ET tube is in place as well as OG tube NECK:  Supple, no jugular venous distention. No thyroid enlargement, no tenderness.  LUNGS: Normal breath sounds bilaterally, no wheezing, numerous rales and  Crepitations noted bilaterally in both lung fields, more on the left. Intermittently uses accessory muscles of respiration.  CARDIOVASCULAR: S1, S2 normal. No murmurs, rubs, or gallops.  ABDOMEN: Soft, nontender, nondistended. Bowel sounds present. No organomegaly or mass.  EXTREMITIES: No pedal edema, cyanosis, or clubbing.  NEUROLOGIC: Cranial nerves II through XII not able to evaluate, but difficult to examine since patient is on the vent. Muscle strength not able to evaluate. Sensation not able to assess. Gait not checked.  PSYCHIATRIC: The patient is somnolent, sedated intermittently opens his eyes.  SKIN: No obvious rash, lesion, or ulcer.   ORDERS/RESULTS REVIEWED:   CBC  Recent Labs Lab 08/18/2014 2025 08/20/14 0137 08/21/14 0028  WBC 32.9* 34.4* 21.8*  HGB 9.3* 8.6* 7.5*  HCT 29.3* 27.6* 23.7*  PLT 196 181 145*  MCV 83.2 82.6 82.6  MCH 26.4 25.8* 26.2  MCHC 31.8* 31.2* 31.7*  RDW 19.4* 19.2* 19.4*  LYMPHSABS 0.3*  --   --   MONOABS 0.0*  --   --   EOSABS 0.0  --   --   BASOSABS 0.0  --   --    ------------------------------------------------------------------------------------------------------------------  Chemistries   Recent Labs Lab 08/21/2014 2025 08/20/14 0137 08/20/14 1037 08/20/14 1636 08/21/14 0905  NA 135 136  --  136 134*  K 3.2* 3.9  --  4.4 3.5  CL 107 108  --  108 106  CO2 18* 20*  --  22 21*  GLUCOSE 135* 167*  --  137* 197*  BUN 18 17  --  19 24*  CREATININE 0.99 1.04  --  1.63* 1.93*  CALCIUM 7.5* 6.9*  --  6.5* 6.0*  MG  --  1.5* 2.2  --   --   AST 30 38  --  60*  --   ALT 14* 14*  --  17  --   ALKPHOS 118 99  --  97  --   BILITOT 0.4 <0.1*  --  0.4  --     ------------------------------------------------------------------------------------------------------------------ estimated creatinine clearance is 25.3 mL/min (by C-G formula based on Cr of 1.93). ------------------------------------------------------------------------------------------------------------------ No results for input(s): TSH, T4TOTAL, T3FREE, THYROIDAB in the last 72 hours.  Invalid input(s): FREET3  Cardiac Enzymes  Recent Labs Lab 08/20/14 0003 08/20/14 0510 08/20/14 1037  TROPONINI 1.03* 4.15* 6.44*   ------------------------------------------------------------------------------------------------------------------ Invalid input(s): POCBNP ---------------------------------------------------------------------------------------------------------------  RADIOLOGY: Dg Chest 2 View  08/24/2014   CLINICAL DATA:  Fever, chills, cough. History of prostate cancer, receiving chemotherapy at [facility]. On Levaquin for sinus infection.  EXAM: CHEST  2 VIEW  COMPARISON:  07/06/2014.  FINDINGS: Right middle lobe opacity, overlying the heart on the lateral view, suspicious for pneumonia. Left basilar opacity, atelectasis versus pneumonia. No pleural effusion or pneumothorax.  The heart is normal in size.  Degenerative changes of the visualized thoracolumbar spine.  IMPRESSION: Right middle lobe pneumonia.  Left lower lobe opacity, atelectasis versus pneumonia.   Electronically Signed   By: Julian Hy M.D.   On: 08/29/2014 18:58   Dg Abd 1 View  08/20/2014   CLINICAL DATA:  Orogastric tube placement.  Initial encounter.  EXAM: ABDOMEN - 1 VIEW  COMPARISON:  None.  FINDINGS: The patient's enteric tube is seen ending overlying the antrum of the stomach.  The visualized bowel gas pattern is grossly unremarkable. No free intra-abdominal air is seen, though evaluation for free air is limited on a single supine view.  There is an unusual sclerotic appearance to the lumbar spine, with  suggestion of scattered sclerotic lesions within the sacrum and ilium, concerning for metastatic disease.  IMPRESSION: 1. Enteric tube noted ending overlying the antrum of the stomach. 2. Visualized sclerotic lesions throughout the osseous structures raises concern for metastatic disease from the patient's known prostate primary.   Electronically Signed   By: Garald Balding M.D.   On: 08/20/2014 02:09   Dg Chest Port 1 View  08/21/2014   CLINICAL DATA:  Acute respiratory failure, intubated  EXAM: PORTABLE CHEST - 1 VIEW  COMPARISON:  08/20/2014  FINDINGS: Cardiomediastinal silhouette is stable. Endotracheal tube and NG tube are unchanged in position. Again noted right arm PICC line with tip in SVC. There is hazy atelectasis or infiltrate right base medially. Probable small left pleural effusion with left basilar atelectasis or infiltrate. No convincing pulmonary edema. Degenerative changes bilateral shoulders.  IMPRESSION: Endotracheal tube and NG tube are unchanged in position. Again noted right arm PICC line with tip in SVC. There is hazy atelectasis or infiltrate right base medially. Probable small left pleural effusion with left basilar atelectasis or infiltrate. No convincing pulmonary edema.   Electronically Signed   By: Lahoma Crocker M.D.   On: 08/21/2014 09:40   Dg Chest Port 1 View  08/20/2014   CLINICAL DATA:  Status post right PICC line placement  EXAM: PORTABLE CHEST - 1 VIEW  COMPARISON:  08/14/2014  FINDINGS: Cardiac shadow is stable. An endotracheal tube and nasogastric catheter are again identified and  stable. A new right-sided PICC line is noted in the mid to distal superior vena cava in satisfactory position. The lungs are well aerated bilaterally with slight improved aeration in the basis when compared with the prior exam.  IMPRESSION: Improved aeration in the bases.  Status post PICC line placement in satisfactory position.   Electronically Signed   By: Inez Catalina M.D.   On: 08/20/2014 17:59    Dg Chest Port 1 View  08/20/2014   CLINICAL DATA:  Endotracheal tube placement.  Initial encounter.  EXAM: PORTABLE CHEST - 1 VIEW  COMPARISON:  Chest radiograph performed earlier today at 6:07 p.m.  FINDINGS: The patient's endotracheal tube is seen ending 5 cm above the carina.  The lungs are well-aerated. Bilateral central and bibasilar airspace opacities raise concern for mild pulmonary edema, though pneumonia could have a similar appearance. Underlying vascular congestion is noted. No definite pleural effusion or pneumothorax is seen. The left costophrenic angle is incompletely imaged on this study.  The cardiomediastinal silhouette is borderline normal in size. No acute osseous abnormalities are seen.  IMPRESSION: 1. Endotracheal tube seen ending 5 cm above the carina. 2. Bilateral central and bibasilar airspace opacities, new from the prior study, raise concern for pulmonary edema, though pneumonia could have a similar appearance. Underlying vascular congestion seen.   Electronically Signed   By: Garald Balding M.D.   On: 08/18/2014 21:38    EKG:  Orders placed or performed during the hospital encounter of 08/30/2014  . ED EKG 12-Lead  . ED EKG 12-Lead  . EKG 12-Lead  . EKG 12-Lead  . EKG 12-Lead  . EKG 12-Lead  . EKG 12-Lead  . EKG 12-Lead    ASSESSMENT AND PLAN:  Active Problems:   Severe sepsis   Acute respiratory failure with hypoxia   A-fib 1. Severe sepsis due to left lower lobe and possible right lower lobe pneumonia, continue  vancomycin , Zosyn , Zithromax , get tracheal aspirate for cultures, follow culture results. Tracheal aspirate is growing gram-negative rods, identification to follow. Blood cultures and negative 2. Left lower lobe pneumonia, as above. Broad-spectrum antibiotic therapy. Cultures. Adjust antibiotics depending on culture results 3. Acute pulmonary edema due to acute systolic congestive heart failure likely due to fluid overload during resuscitation,  Echocardiogram showed ejection fraction of 30-35%, global hypokinesis. IV fluids were restricted. However, patient's kidney function is abnormal now, will initiated low rate IV fluids and follow kidney function as well as respiratory status very closely Trying to balance ins and outs  4. Septic shock. Continue levofed keeping map is around 65 and above 5. Non-Q-wave MI, type II, likely due to demand ischemia. Echocardiogram reveals ejection fraction of 30-35%. Cardiology consultation is appreciated. Unable to use metoprolol or nitroglycerin due to hypotension, septic shock. Patient was continued on a heparin IV drip. However, now changed to Eliquis by cardiologist due to A. Fib, decrease Eliquis dose to 2.5 twice daily due to renal function abnormalities 6. Acute respiratory failure due to pneumonia as well as acute pulmonary edema. Continue mechanical ventilation. Appreciate pulmonary input 7. Leukocytosis. Improved with antibiotic therapy 8. Lactic metabolic acidosis likely due to poor peripheral perfusion. Status post PICC line lay cement 13th of August 2016. Continue IV fluids as well as levo fed keeping his map is around 65 and above to improve peripheral perfusion 9. Atrial fibrillation, atrial flutter. Now on Eliquis 10. Acute renal failure, initiate IV fluids and follow patient's kidney function tomorrow morning following for recurrence of  acute systolic CHF, acute pulmonary edema.  Management plans discussed with the patient, family and they are in agreement.   DRUG ALLERGIES: No Known Allergies  CODE STATUS:     Code Status Orders        Start     Ordered   08/20/2014 2127  Full code   Continuous     08/18/2014 2127    Advance Directive Documentation        Most Recent Value   Type of Advance Directive  Healthcare Power of Attorney, Living will   Pre-existing out of facility DNR order (yellow form or pink MOST form)     "MOST" Form in Place?        TOTAL TIME TAKING CARE OF THIS  PATIENT: 50 minutes.    Theodoro Grist M.D on 08/21/2014 at 2:41 PM  Between 7am to 6pm - Pager - [phone]  After 6pm go to [email or web address] - password [email or web address] [facility]  Office  [phone]  CC: Primary care physician; Lelon Huh, MD

## 2014-08-21 NOTE — Progress Notes (Addendum)
ANTIBIOTIC CONSULT NOTE - INITIAL  Pharmacy Consult for Vancomycin/Zosyn Indication: severe sepsis  No Known Allergies  Patient Measurements: Height: 5\' 6"  (167.6 cm) Weight: 159 lb 2.8 oz (72.2 kg) IBW/kg (Calculated) : 63.8 Adjusted Body Weight: 65.3 kg  Vital Signs: Temp: 97.9 F (36.6 C) (08/14 0739) Temp Source: Axillary (08/14 0739) BP: 101/57 mmHg (08/14 1000) Pulse Rate: 95 (08/14 1000) Intake/Output from previous day: 08/13 0701 - 08/14 0700 In: 1429.6 [I.V.:605.1; NG/GT:174.6; IV Piggyback:650] Out: 500 [Urine:500] Intake/Output from this shift: Total I/O In: 31.7 [I.V.:6.7; NG/GT:25] Out: -   Labs:  Recent Labs  08/18/2014 2025 08/20/14 0137 08/20/14 1636 08/21/14 0028 08/21/14 0905  WBC 32.9* 34.4*  --  21.8*  --   HGB 9.3* 8.6*  --  7.5*  --   PLT 196 181  --  145*  --   CREATININE 0.99 1.04 1.63*  --  1.93*   Estimated Creatinine Clearance: 25.3 mL/min (by C-G formula based on Cr of 1.93). No results for input(s): VANCOTROUGH, VANCOPEAK, VANCORANDOM, GENTTROUGH, GENTPEAK, GENTRANDOM, TOBRATROUGH, TOBRAPEAK, TOBRARND, AMIKACINPEAK, AMIKACINTROU, AMIKACIN in the last 72 hours.   Microbiology: Recent Results (from the past 720 hour(s))  MRSA PCR Screening     Status: None   Collection Time: 08/23/2014  6:02 PM  Result Value Ref Range Status   MRSA by PCR NEGATIVE NEGATIVE Final    Comment:        The GeneXpert MRSA Assay (FDA approved for NASAL specimens only), is one component of a comprehensive MRSA colonization surveillance program. It is not intended to diagnose MRSA infection nor to guide or monitor treatment for MRSA infections.   Blood Culture (routine x 2)     Status: None (Preliminary result)   Collection Time: 09/01/2014  7:15 PM  Result Value Ref Range Status   Specimen Description BLOOD RIGHT ARM  Final   Special Requests BOTTLES DRAWN AEROBIC AND ANAEROBIC 4CC  Final   Culture NO GROWTH 2 DAYS  Final   Report Status PENDING   Incomplete  Blood Culture (routine x 2)     Status: None (Preliminary result)   Collection Time: 08/14/2014  7:25 PM  Result Value Ref Range Status   Specimen Description BLOOD LEFT ASSIST CONTROL  Final   Special Requests BOTTLES DRAWN AEROBIC AND ANAEROBIC 4CC  Final   Culture NO GROWTH 2 DAYS  Final   Report Status PENDING  Incomplete  Urine culture     Status: None (Preliminary result)   Collection Time: 09/02/2014  8:25 PM  Result Value Ref Range Status   Specimen Description URINE, RANDOM  Final   Special Requests NONE  Final   Culture TOO YOUNG TO READ  Final   Report Status PENDING  Incomplete  Culture, expectorated sputum-assessment     Status: None   Collection Time: 08/20/14 10:05 AM  Result Value Ref Range Status   Specimen Description EXPECTORATED SPUTUM  Final   Special Requests Normal  Final   Sputum evaluation THIS SPECIMEN IS ACCEPTABLE FOR SPUTUM CULTURE  Final   Report Status 08/20/2014 FINAL  Final  Culture, respiratory (NON-Expectorated)     Status: None (Preliminary result)   Collection Time: 08/20/14 10:05 AM  Result Value Ref Range Status   Specimen Description EXPECTORATED SPUTUM  Final   Special Requests Normal Reflexed from [id number]  Final   Gram Stain PENDING  Incomplete   Culture   Final    LIGHT GROWTH GRAM NEGATIVE RODS IDENTIFICATION AND SUSCEPTIBILITIES TO FOLLOW  Report Status PENDING  Incomplete    Medical History: Past Medical History  Diagnosis Date  . Cancer     prostate  . Heart disease   . Prostate cancer 2014    8 weeks of radiation, [facility]  . Hypertension   . Hyperlipidemia     Medications:  Prescriptions prior to admission  Medication Sig Dispense Refill Last Dose  . albuterol (PROVENTIL HFA;VENTOLIN HFA) 108 (90 BASE) MCG/ACT inhaler Inhale 2 puffs into the lungs every 6 (six) hours as needed for wheezing. 1 Inhaler 1 08/31/2014 at Unknown time  . amLODipine (NORVASC) 5 MG tablet Take 5 mg by mouth daily.   08/29/2014 at Unknown  time  . aspirin EC 81 MG tablet Take 81 mg by mouth daily.   08/24/2014 at Unknown time  . atenolol (TENORMIN) 50 MG tablet Take 50 mg by mouth daily.   08/23/2014 at 1030  . atorvastatin (LIPITOR) 20 MG tablet Take 20 mg by mouth at bedtime.   08/18/2014 at Unknown time  . Calcium Carbonate-Vitamin D (CALCIUM 600+D) 600-400 MG-UNIT per tablet Take 1 tablet by mouth daily.   08/14/2014 at Unknown time  . clotrimazole-betamethasone (LOTRISONE) cream Apply 1 application topically 2 (two) times daily.   08/12/2014 at Unknown time  . dexamethasone (DECADRON) 4 MG tablet Take 8 mg by mouth 2 (two) times daily. Pt only takes the day before and day after DOCEtaxel.   Past Week at Unknown time  . Diphenhyd-Hydrocort-Nystatin (FIRST-DUKES MOUTHWASH) SUSP Use as directed 30 mLs in the mouth or throat as needed (for mouth sores).   PRN at PRN  . diphenhydramine-acetaminophen (TYLENOL PM) 25-500 MG TABS Take 1 tablet by mouth at bedtime as needed (for sleep).   08/18/2014 at Unknown time  . fluticasone (FLONASE) 50 MCG/ACT nasal spray Place 1 spray into both nostrils daily.   08/16/2014 at Unknown time  . folic acid (FOLVITE) 378 MCG tablet Take 800 mcg by mouth daily.   08/21/2014 at Unknown time  . leuprolide, 6 Month, (ELIGARD) 45 MG injection Inject 45 mg into the skin every 6 (six) months.   April at unknown   . Melatonin 5 MG TABS Take 1 tablet by mouth at bedtime as needed (for sleep).    08/18/2014 at Unknown time  . mometasone (ASMANEX) 220 MCG/INH inhaler Inhale 1 puff into the lungs 2 (two) times daily.   08/24/2014 at Unknown time  . montelukast (SINGULAIR) 10 MG tablet Take 10 mg by mouth at bedtime.   08/18/2014 at Unknown time  . prochlorperazine (COMPAZINE) 10 MG tablet Take 10 mg by mouth every 6 (six) hours as needed for nausea or vomiting.   09/03/2014 at Unknown time  . pyridOXINE (VITAMIN B-6) 100 MG tablet Take 100 mg by mouth daily.   08/14/2014 at Unknown time  . solifenacin (VESICARE) 10 MG tablet  Take 10 mg by mouth at bedtime.   08/18/2014 at Unknown time  . tamsulosin (FLOMAX) 0.4 MG CAPS capsule Take 0.4 mg by mouth daily.   08/13/2014 at Unknown time  . temazepam (RESTORIL) 7.5 MG capsule Take 7.5 mg by mouth at bedtime as needed for sleep.   08/18/2014 at Unknown time  . vitamin B-12 (CYANOCOBALAMIN) 1000 MCG tablet Take 1,000 mcg by mouth daily.   08/23/2014 at Unknown time  . hyoscyamine (LEVSIN SL) 0.125 MG SL tablet Place 1 tablet (0.125 mg total) under the tongue every 4 (four) hours as needed. (Patient not taking: Reported on 08/21/2014) 30 tablet 0  Assessment: CrCl = 50.4 ml/min Ke = 0.05 hr-1 T1/2 = 13.9 hrs Vd = 45.7 L   Goal of Therapy:  Vancomycin trough level 15-20 mcg/ml  Plan:  Expected duration 7 days with resolution of temperature and/or normalization of WBC   Zosyn 3.375 gm IV X 1 given in ED on 8/12 @ 19:30. Zosyn 4.5 gm IV Q8H EI ordered to start 8/13 @ 4:00.   Vancomycin 1 gm IV X 1 given on 8/12 @ 20:30.  Vancomycin 1 gm IV Q18H ordered to start 8/13 @ 5:00. This pt will reach Css by 8/15 @ 21:00. Will draw 1st trough on 8/16 @ 4:30, which will be at Css.    8/14: Patient now in significant AKI secondary to sepsis. Will need to dose per levels for now as regimen likely not appropriate in this patient with ever changing renal function. Will need to follow renal fx closely. Random level ordered to be drawn 18 hours post last dose which is @ 17:00 on 8/14. If random level is within goal range of 15-20  Mcg/ml or low continue Vancomycin 1g IV q18 hours. If random  level is high. Order a recheck @ 23:00 (which would be ~24 hours post dose). Likely patient will need a q24 hour regimen due to renal insufficiency.   Although patient's CrCL is >20 ml/min based on calculation, CrCL is not an accurate measure of renal function in a patient currently in AKI. Will dose Zosyn based on a CrCL <10 ml/min. Will change to Zosyn 3.375 g IV q12 hours.   Random level  ordered to  James,Teldrin D 08/21/2014,10:42 AM  8/14 @ 1730 Vanc Random Level was 14.  Continue q18hr schedule and instructed nurse to hang next Vancomycin dose ASAP. Hayzen Lorenson K 08/21/2014 6:04 PM

## 2014-08-21 NOTE — Progress Notes (Signed)
PULMONARY / CRITICAL CARE MEDICINE   Name: Marcus Blevins MRN: [id number] DOB: [birth year]    ADMISSION DATE:  08/12/2014 CONSULTATION DATE: 08/20/14  REFERRING MD : Dr. Theodoro Grist [facility] - [facility]   CHIEF COMPLAINT:    Fever, chills and cough   HISTORY OF PRESENT ILLNESS   Patient intubated and sedated, History obtained from chart review. Called all contact number listed in the chart, unable to establish contact with daughter or wife, left VM on daughter phone.  81 y.o. male who has a history of being on chemotherapy for prostate cancer, who has been started on Levaquin for sinus infection and a cough 2-3 days prior, was presenting due to a fever at home of 100.9, chills, persistent cough, and being asked to come in by his oncologist on 08/12/2014, presented to [facility] ED. No nausea, vomiting, or diarrhea. Moderate cough but no chest pain. Mild shortness breath, initially. While in the ED, his respiratory status rapidly declined and he was emergently intubated. CXR showed B\L opacites, consisted with Pulm edema, and his Troponins were elevated, he is currently on a heparin gtt.  PFT 2014 ([facility]) - Spirometry with flow volume loop demonstrates moderate airway obstruction. Lung volumes are consistent with air trapping.    SIGNIFICANT EVENTS    No significant events overnight, sedated and intubated on MV. Requiring mild dose of pressors    PAST MEDICAL HISTORY    :  Past Medical History  Diagnosis Date  . Cancer     prostate  . Heart disease   . Prostate cancer 2014    8 weeks of radiation, [facility]  . Hypertension   . Hyperlipidemia    Past Surgical History  Procedure Laterality Date  . Colonoscopy  2007  . Cholecystectomy  1958  . Bladder scraping  03-15-14    [facility], for bladder cancer  . Tumor removed from bladder     Prior to Admission medications   Medication Sig Start Date End Date Taking? Authorizing Provider  albuterol (PROVENTIL  HFA;VENTOLIN HFA) 108 (90 BASE) MCG/ACT inhaler Inhale 2 puffs into the lungs every 6 (six) hours as needed for wheezing. 08/03/14  Yes Birdie Sons, MD  amLODipine (NORVASC) 5 MG tablet Take 5 mg by mouth daily.   Yes Historical Provider, MD  aspirin EC 81 MG tablet Take 81 mg by mouth daily.   Yes Historical Provider, MD  atenolol (TENORMIN) 50 MG tablet Take 50 mg by mouth daily.   Yes Historical Provider, MD  atorvastatin (LIPITOR) 20 MG tablet Take 20 mg by mouth at bedtime.   Yes Historical Provider, MD  Calcium Carbonate-Vitamin D (CALCIUM 600+D) 600-400 MG-UNIT per tablet Take 1 tablet by mouth daily.   Yes Historical Provider, MD  clotrimazole-betamethasone (LOTRISONE) cream Apply 1 application topically 2 (two) times daily.   Yes Historical Provider, MD  dexamethasone (DECADRON) 4 MG tablet Take 8 mg by mouth 2 (two) times daily. Pt only takes the day before and day after DOCEtaxel.   Yes Historical Provider, MD  Diphenhyd-Hydrocort-Nystatin (FIRST-DUKES MOUTHWASH) SUSP Use as directed 30 mLs in the mouth or throat as needed (for mouth sores).   Yes Historical Provider, MD  diphenhydramine-acetaminophen (TYLENOL PM) 25-500 MG TABS Take 1 tablet by mouth at bedtime as needed (for sleep).   Yes Historical Provider, MD  fluticasone (FLONASE) 50 MCG/ACT nasal spray Place 1 spray into both nostrils daily.   Yes Historical Provider, MD  folic acid (FOLVITE) 272 MCG tablet Take 800 mcg  by mouth daily.   Yes Historical Provider, MD  leuprolide, 6 Month, (ELIGARD) 45 MG injection Inject 45 mg into the skin every 6 (six) months.   Yes Historical Provider, MD  Melatonin 5 MG TABS Take 1 tablet by mouth at bedtime as needed (for sleep).    Yes Historical Provider, MD  mometasone (ASMANEX) 220 MCG/INH inhaler Inhale 1 puff into the lungs 2 (two) times daily.   Yes Historical Provider, MD  montelukast (SINGULAIR) 10 MG tablet Take 10 mg by mouth at bedtime.   Yes Historical Provider, MD   prochlorperazine (COMPAZINE) 10 MG tablet Take 10 mg by mouth every 6 (six) hours as needed for nausea or vomiting.   Yes Historical Provider, MD  pyridOXINE (VITAMIN B-6) 100 MG tablet Take 100 mg by mouth daily.   Yes Historical Provider, MD  solifenacin (VESICARE) 10 MG tablet Take 10 mg by mouth at bedtime.   Yes Historical Provider, MD  tamsulosin (FLOMAX) 0.4 MG CAPS capsule Take 0.4 mg by mouth daily.   Yes Historical Provider, MD  temazepam (RESTORIL) 7.5 MG capsule Take 7.5 mg by mouth at bedtime as needed for sleep.   Yes Historical Provider, MD  vitamin B-12 (CYANOCOBALAMIN) 1000 MCG tablet Take 1,000 mcg by mouth daily.   Yes Historical Provider, MD  hyoscyamine (LEVSIN SL) 0.125 MG SL tablet Place 1 tablet (0.125 mg total) under the tongue every 4 (four) hours as needed. Patient not taking: Reported on 08/28/2014 07/20/14   Birdie Sons, MD   No Known Allergies   FAMILY HISTORY   Family History  Problem Relation Age of Onset  . CVA Mother   . Heart attack Father       SOCIAL HISTORY    reports that he quit smoking about 60 years ago. He has never used smokeless tobacco. He reports that he does not drink alcohol or use illicit drugs.  ROS- unable to obtain - patient intubated and sedated    VITAL SIGNS    Temp:  [97.9 F (36.6 C)-99 F (37.2 C)] 97.9 F (36.6 C) (08/14 0739) Pulse Rate:  [90-112] 95 (08/14 1000) Resp:  [13-22] 20 (08/14 1000) BP: (68-103)/(47-61) 101/57 mmHg (08/14 1000) SpO2:  [92 %-100 %] 98 % (08/14 1000) FiO2 (%):  [45 %-60 %] 50 % (08/14 0752) Weight:  [145 lb 1 oz (65.8 kg)-159 lb 2.8 oz (72.2 kg)] 159 lb 2.8 oz (72.2 kg) (08/14 0414) HEMODYNAMICS:   VENTILATOR SETTINGS: Vent Mode:  [-] PRVC FiO2 (%):  [45 %-60 %] 50 % Set Rate:  [14 bmp-22 bmp] 20 bmp Vt Set:  [450 mL-530 mL] 530 mL PEEP:  [5 cmH20-8 cmH20] 8 cmH20 INTAKE / OUTPUT:  Intake/Output Summary (Last 24 hours) at 08/21/14 1046 Last data filed at 08/21/14 0740   Gross per 24 hour  Intake 1461.3 ml  Output    500 ml  Net  961.3 ml       PHYSICAL EXAM   Physical Exam GENERAL:sedated on the MV, no acute on the vent.  HEAD: Normocephalic, atraumatic.  EYES: Pupils equal, round, reactive to light. Unable to assess extraocular muscles given mental status/medical condition. No scleral icterus.  MOUTH: Moist mucosal membrane. Dentition intact. No abscess noted.  EAR, NOSE, THROAT: Clear without exudates. No external lesions.  NECK: Supple. No thyromegaly. No nodules. No JVD.  PULMONARY: Coarse breath sounds with scattered rhonchi right lung field, intubated/mechanical ventilation CARDIOVASCULAR: S1 and S2. Tachycardic No murmurs, rubs, or gallops. No edema. Pedal  pulses 2+ bilaterally.  GASTROINTESTINAL: Soft, nontender, nondistended. No masses. Positive bowel sounds. No hepatosplenomegaly.  MUSCULOSKELETAL: No swelling, clubbing, or edema. Range of motion full in all extremities.  NEUROLOGIC: Unable to assess given mental status/medical condition SKIN: No ulceration, lesions, rashes, or cyanosis. Skin warm and dry. Turgor intact.     LABS   LABS:  CBC  Recent Labs Lab 08/22/2014 2025 08/20/14 0137 08/21/14 0028  WBC 32.9* 34.4* 21.8*  HGB 9.3* 8.6* 7.5*  HCT 29.3* 27.6* 23.7*  PLT 196 181 145*   Coag's  Recent Labs Lab 08/20/14 0137  APTT 36  INR 1.20   BMET  Recent Labs Lab 08/20/14 0137 08/20/14 1636 08/21/14 0905  NA 136 136 134*  K 3.9 4.4 3.5  CL 108 108 106  CO2 20* 22 21*  BUN 17 19 24*  CREATININE 1.04 1.63* 1.93*  GLUCOSE 167* 137* 197*   Electrolytes  Recent Labs Lab 08/20/14 0137 08/20/14 1037 08/20/14 1636 08/21/14 0905  CALCIUM 6.9*  --  6.5* 6.0*  MG 1.5* 2.2  --   --    Sepsis Markers  Recent Labs Lab 08/20/14 0003 08/20/14 1534 08/21/14 0028  LATICACIDVEN 2.2* 1.5 1.6   ABG  Recent Labs Lab 08/20/14 1735 08/20/14 1917 08/21/14 0505  PHART 7.19* 7.23* 7.30*  PCO2ART  51* 45 37  PO2ART 76* 63* 126*   Liver Enzymes  Recent Labs Lab 08/26/2014 2025 08/20/14 0137 08/20/14 1636  AST 30 38 60*  ALT 14* 14* 17  ALKPHOS 118 99 97  BILITOT 0.4 <0.1* 0.4  ALBUMIN 3.0* 2.6* 2.7*   Cardiac Enzymes  Recent Labs Lab 08/20/14 0003 08/20/14 0510 08/20/14 1037  TROPONINI 1.03* 4.15* 6.44*   Glucose  Recent Labs Lab 09/01/2014 2055 08/09/2014 2311 08/20/14 1602 08/20/14 2351 08/21/14 0412 08/21/14 0758  GLUCAP 143* 171* 110* 183* 157* 181*     Recent Results (from the past 240 hour(s))  MRSA PCR Screening     Status: None   Collection Time: 08/11/2014  6:02 PM  Result Value Ref Range Status   MRSA by PCR NEGATIVE NEGATIVE Final    Comment:        The GeneXpert MRSA Assay (FDA approved for NASAL specimens only), is one component of a comprehensive MRSA colonization surveillance program. It is not intended to diagnose MRSA infection nor to guide or monitor treatment for MRSA infections.   Blood Culture (routine x 2)     Status: None (Preliminary result)   Collection Time: 08/28/2014  7:15 PM  Result Value Ref Range Status   Specimen Description BLOOD RIGHT ARM  Final   Special Requests BOTTLES DRAWN AEROBIC AND ANAEROBIC 4CC  Final   Culture NO GROWTH 2 DAYS  Final   Report Status PENDING  Incomplete  Blood Culture (routine x 2)     Status: None (Preliminary result)   Collection Time: 09/01/2014  7:25 PM  Result Value Ref Range Status   Specimen Description BLOOD LEFT ASSIST CONTROL  Final   Special Requests BOTTLES DRAWN AEROBIC AND ANAEROBIC 4CC  Final   Culture NO GROWTH 2 DAYS  Final   Report Status PENDING  Incomplete  Urine culture     Status: None (Preliminary result)   Collection Time: 08/27/2014  8:25 PM  Result Value Ref Range Status   Specimen Description URINE, RANDOM  Final   Special Requests NONE  Final   Culture TOO YOUNG TO READ  Final   Report Status PENDING  Incomplete  Culture, expectorated sputum-assessment      Status: None   Collection Time: 08/20/14 10:05 AM  Result Value Ref Range Status   Specimen Description EXPECTORATED SPUTUM  Final   Special Requests Normal  Final   Sputum evaluation THIS SPECIMEN IS ACCEPTABLE FOR SPUTUM CULTURE  Final   Report Status 08/20/2014 FINAL  Final  Culture, respiratory (NON-Expectorated)     Status: None (Preliminary result)   Collection Time: 08/20/14 10:05 AM  Result Value Ref Range Status   Specimen Description EXPECTORATED SPUTUM  Final   Special Requests Normal Reflexed from [phone]  Final   Gram Stain PENDING  Incomplete   Culture   Final    LIGHT GROWTH GRAM NEGATIVE RODS IDENTIFICATION AND SUSCEPTIBILITIES TO FOLLOW    Report Status PENDING  Incomplete     Current facility-administered medications:  .  acetaminophen (TYLENOL) tablet 650 mg, 650 mg, Oral, Q6H PRN **OR** acetaminophen (TYLENOL) suppository 650 mg, 650 mg, Rectal, Q6H PRN, Lytle Butte, MD .  antiseptic oral rinse solution (CORINZ), 7 mL, Mouth Rinse, QID, Theodoro Grist, MD, 7 mL at 08/21/14 0405 .  apixaban (ELIQUIS) tablet 5 mg, 5 mg, Oral, BID, Teodoro Spray, MD, 5 mg at 08/21/14 1046 .  aspirin EC tablet 81 mg, 81 mg, Oral, Daily, Lytle Butte, MD, 81 mg at 08/21/14 1046 .  atorvastatin (LIPITOR) tablet 20 mg, 20 mg, Oral, QHS, Lytle Butte, MD, 20 mg at 08/20/14 2144 .  azithromycin (ZITHROMAX) 500 mg in dextrose 5 % 250 mL IVPB, 500 mg, Intravenous, Q24H, Theodoro Grist, MD, 500 mg at 08/21/14 1043 .  budesonide (PULMICORT) nebulizer solution 0.5 mg, 0.5 mg, Nebulization, BID, Lytle Butte, MD, 0.5 mg at 08/21/14 0750 .  chlorhexidine gluconate (PERIDEX) 0.12 % solution 15 mL, 15 mL, Mouth Rinse, BID, Theodoro Grist, MD, 15 mL at 08/20/14 2144 .  darifenacin (ENABLEX) 24 hr tablet 7.5 mg, 7.5 mg, Oral, Daily, Lytle Butte, MD, 7.5 mg at 08/21/14 1046 .  dexmedetomidine (PRECEDEX) 400 MCG/100ML (4 mcg/mL) infusion, 0.4-1.2 mcg/kg/hr, Intravenous, Titrated, Theodoro Grist, MD,  Last Rate: 11.4 mL/hr at 08/21/14 0852, 0.7 mcg/kg/hr at 08/21/14 0852 .  feeding supplement (VITAL AF 1.2 CAL) liquid 1,000 mL, 1,000 mL, Per Tube, Continuous, Shanavia Makela, MD, Last Rate: 25 mL/hr at 08/21/14 0812, 1,000 mL at 08/21/14 0812 .  fentaNYL (SUBLIMAZE) injection 50 mcg, 50 mcg, Intravenous, Q2H PRN, Oneill Bais, MD .  fentaNYL 2521mcg in NS 227mL (54mcg/ml) infusion-PREMIX, 10 mcg/hr, Intravenous, Continuous, Theodoro Grist, MD, Last Rate: 27.5 mL/hr at 08/21/14 0813, 275 mcg/hr at 08/21/14 0813 .  free water 25 mL, 25 mL, Per Tube, 6 times per day, Domnick Chervenak, MD, 25 mL at 08/21/14 0800 .  insulin aspart (novoLOG) injection 2-6 Units, 2-6 Units, Subcutaneous, 6 times per day, Vilinda Boehringer, MD, 4 Units at 08/21/14 0813 .  ipratropium-albuterol (DUONEB) 0.5-2.5 (3) MG/3ML nebulizer solution 3 mL, 3 mL, Nebulization, Q4H, Lytle Butte, MD, 3 mL at 08/21/14 0750 .  metoprolol tartrate (LOPRESSOR) tablet 12.5 mg, 12.5 mg, Oral, BID, Jaquelynn Wanamaker, MD, 12.5 mg at 08/20/14 1647 .  midazolam (VERSED) 50 mg in sodium chloride 0.9 % 50 mL (1 mg/mL) infusion, 1 mg/hr, Intravenous, Continuous, Theodoro Grist, MD, Last Rate: 0.3 mL/hr at 08/21/14 0852, 0.25 mg/hr at 08/21/14 0852 .  montelukast (SINGULAIR) tablet 10 mg, 10 mg, Oral, QHS, Lytle Butte, MD, 10 mg at 08/20/14 2144 .  norepinephrine (LEVOPHED) 4mg  in D5W 270mL premix infusion, 0-40  mcg/min, Intravenous, Titrated, Theodoro Grist, MD, Last Rate: 30 mL/hr at 08/21/14 0812, 8 mcg/min at 08/21/14 0812 .  ondansetron (ZOFRAN) tablet 4 mg, 4 mg, Oral, Q6H PRN **OR** ondansetron (ZOFRAN) injection 4 mg, 4 mg, Intravenous, Q6H PRN, Lytle Butte, MD .  piperacillin-tazobactam (ZOSYN) IVPB 3.375 g, 3.375 g, Intravenous, Q12H, Theodoro Grist, MD .  sennosides (SENOKOT) 8.8 MG/5ML syrup 5 mL, 5 mL, Per Tube, QHS PRN, Jacari Kirsten, MD .  sodium chloride 0.9 % injection 3 mL, 3 mL, Intravenous, Q12H, Lytle Butte, MD, 3 mL at 08/20/14 2144 .   tamsulosin (FLOMAX) capsule 0.4 mg, 0.4 mg, Oral, Daily, Lytle Butte, MD, 0.4 mg at 08/20/14 1215 .  vancomycin (VANCOCIN) IVPB 1000 mg/200 mL premix, 1,000 mg, Intravenous, Q18H, Theodoro Grist, MD  IMAGING    Dg Chest Port 1 View  08/21/2014   CLINICAL DATA:  Acute respiratory failure, intubated  EXAM: PORTABLE CHEST - 1 VIEW  COMPARISON:  08/20/2014  FINDINGS: Cardiomediastinal silhouette is stable. Endotracheal tube and NG tube are unchanged in position. Again noted right arm PICC line with tip in SVC. There is hazy atelectasis or infiltrate right base medially. Probable small left pleural effusion with left basilar atelectasis or infiltrate. No convincing pulmonary edema. Degenerative changes bilateral shoulders.  IMPRESSION: Endotracheal tube and NG tube are unchanged in position. Again noted right arm PICC line with tip in SVC. There is hazy atelectasis or infiltrate right base medially. Probable small left pleural effusion with left basilar atelectasis or infiltrate. No convincing pulmonary edema.   Electronically Signed   By: Lahoma Crocker M.D.   On: 08/21/2014 09:40   Dg Chest Port 1 View  08/20/2014   CLINICAL DATA:  Status post right PICC line placement  EXAM: PORTABLE CHEST - 1 VIEW  COMPARISON:  08/26/2014  FINDINGS: Cardiac shadow is stable. An endotracheal tube and nasogastric catheter are again identified and stable. A new right-sided PICC line is noted in the mid to distal superior vena cava in satisfactory position. The lungs are well aerated bilaterally with slight improved aeration in the basis when compared with the prior exam.  IMPRESSION: Improved aeration in the bases.  Status post PICC line placement in satisfactory position.   Electronically Signed   By: Inez Catalina M.D.   On: 08/20/2014 17:59     A\P 81 yo male admitted for sepsis, respiratory failure, intubated, past medical history of prostate cancer currently receiving chemotherapy  PULMONARY  A: Acute respiratory  failure Pulmonary edema Respiratory failure requiring mechanical ventilation Ventilator dyssynchrony P:  Continue with mechanical ventilation, wean as tolerated, assess for SBT daily Sedation/analgesia-stop versed gtt, stop fentanyl gtt - BP is being affected, start precedex gtt and use fentanyl PRN for analgeia Follow-up respiratory cultures - light chain GNR - on Vanc\Zosyn\Azithro Patient with sepsis, source unidentified at this time, may require bronchoscopy with BAL if clinical deterioration continues. Check ABG in the am  CARDIOVASCULAR CVL - Right PICC ([facility]) 8/13 A:  Elevated troponin Non-STEMI P:  Follow-up cardiac enzymes Continue with heparin drip Cardiology consultation Possibly demand ischemia from respiratory failure ECHO 8/13 - EF 30-35%  RENAL A:  Electrolyte abnormalities - hypokalemia, hypocalcemia  P:  Replacement at electrolytes per ICU protocol Monitor I/O Keep fluid level balance Lasix as needed given the presence of vascular congestion on recent chest x-ray  GASTROINTESTINAL   P:  Continue with PPI for stress ulcer prophylaxis Dietary consult Start tube feeds  HEMATOLOGIC A:  Leukocytosis  Sepsis P:  Elevated white blood count possibly due to sepsis versus leukemoid reaction Continue to monitor CBC Continue with current antibiotics   INFECTIOUS A: Sepsis P:  BCx2 8/12>>NTD UC 8/12>>NTD Sputum 8/12>>light chain GNR Abx: Vanc/Zosyn, start date 8/12, day 2/ Azithro, start date 8/13, day 1/  NEUROLOGIC P: Intubated and sedated RASS goal: -1 to -2 Currently on fentanyl and Versed gtt - recommend to change to precedex gtt, and prn Fentanyl, given pressor requirement and low BP.   Follow up with [facility] upon discharge.    I have personally obtained a history, examined the patient, evaluated laboratory and imaging results, formulated the assessment and plan and placed orders.  The Patient requires  high complexity decision making for assessment and support, frequent evaluation and titration of therapies, application of advanced monitoring technologies and extensive interpretation of multiple databases. Critical Care Time devoted to patient care services described in this note is 35 minutes.   Overall, patient is critically ill, prognosis is guarded. Patient at high risk for cardiac arrest and death.   Vilinda Boehringer, MD [facility] Pager [phone] (Please enter [phone]-[phone])     08/21/2014, 10:46 AM

## 2014-08-21 NOTE — Progress Notes (Signed)
Pt remains sedated and intubated. Levophed running at 8 mcgs. Tube feedings started, well tolerated. VSS on levophed. Urine output minimal at 150 ml. No BMP ordered for morning labs.

## 2014-08-21 NOTE — Progress Notes (Signed)
MD informed of Calcium of 6.1, will recheck with am labs on 08/22/14.

## 2014-08-21 NOTE — Progress Notes (Signed)
  ANTICOAGULATION CONSULT NOTE - Initial Consult  Pharmacy Consult for Heparin Indication: chest pain/ACS  No Known Allergies  Patient Measurements: Height: 5\' 6"  (167.6 cm) Weight: 159 lb 2.8 oz (72.2 kg) IBW/kg (Calculated) : 63.8 Heparin Dosing Weight: 72.2 kg  Vital Signs: Temp: 97.9 F (36.6 C) (08/14 0739) Temp Source: Axillary (08/14 0739) BP: 93/53 mmHg (08/14 1100) Pulse Rate: 92 (08/14 1100)  Labs:  Recent Labs  08/17/2014 2025 08/20/14 0003 08/20/14 0137 08/20/14 0510 08/20/14 1037 08/20/14 1636 08/21/14 0028 08/21/14 0905  HGB 9.3*  --  8.6*  --   --   --  7.5*  --   HCT 29.3*  --  27.6*  --   --   --  23.7*  --   PLT 196  --  181  --   --   --  145*  --   APTT  --   --  36  --   --   --   --   --   LABPROT  --   --  15.4*  --   --   --   --   --   INR  --   --  1.20  --   --   --   --   --   CREATININE 0.99  --  1.04  --   --  1.63*  --  1.93*  TROPONINI 0.08* 1.03*  --  4.15* 6.44*  --   --   --     Estimated Creatinine Clearance: 25.3 mL/min (by C-G formula based on Cr of 1.93).   Medical History: Past Medical History  Diagnosis Date  . Cancer     prostate  . Heart disease   . Prostate cancer 2014    8 weeks of radiation, [facility]  . Hypertension   . Hyperlipidemia     Medications:  Scheduled:  . antiseptic oral rinse  7 mL Mouth Rinse QID  . aspirin EC  81 mg Oral Daily  . atorvastatin  20 mg Oral QHS  . azithromycin  500 mg Intravenous Q24H  . budesonide  0.5 mg Nebulization BID  . calcium gluconate  1 g Intravenous Once  . chlorhexidine gluconate  15 mL Mouth Rinse BID  . darifenacin  7.5 mg Oral Daily  . free water  25 mL Per Tube 6 times per day  . insulin aspart  2-6 Units Subcutaneous 6 times per day  . ipratropium-albuterol  3 mL Nebulization Q4H  . montelukast  10 mg Oral QHS  . piperacillin-tazobactam (ZOSYN)  IV  3.375 g Intravenous Q12H  . sodium chloride  3 mL Intravenous Q12H  . tamsulosin  0.4 mg Oral Daily  .  vancomycin  1,000 mg Intravenous Q18H    Assessment: Patient being treated for ACS/STEMI Goal of Therapy:  Heparin level 0.3-0.7 units/ml Monitor platelets by anticoagulation protocol: Yes   Plan:  Give 4000 units bolus x 1 Start heparin infusion at 800 units/hr Check anti-Xa level in 8 hours and daily while on heparin Continue to monitor H&H and platelets   Heparin level ordered to be drawn @ 20:30.   Jamiaya Bina D 08/21/2014,12:12 PM

## 2014-08-22 DIAGNOSIS — J9601 Acute respiratory failure with hypoxia: Secondary | ICD-10-CM

## 2014-08-22 LAB — GLUCOSE, CAPILLARY
GLUCOSE-CAPILLARY: 151 mg/dL — AB (ref 65–99)
GLUCOSE-CAPILLARY: 160 mg/dL — AB (ref 65–99)
GLUCOSE-CAPILLARY: 179 mg/dL — AB (ref 65–99)
GLUCOSE-CAPILLARY: 240 mg/dL — AB (ref 65–99)
GLUCOSE-CAPILLARY: 266 mg/dL — AB (ref 65–99)
Glucose-Capillary: 176 mg/dL — ABNORMAL HIGH (ref 65–99)
Glucose-Capillary: 294 mg/dL — ABNORMAL HIGH (ref 65–99)
Glucose-Capillary: 321 mg/dL — ABNORMAL HIGH (ref 65–99)

## 2014-08-22 LAB — BASIC METABOLIC PANEL
ANION GAP: 3 — AB (ref 5–15)
BUN: 23 mg/dL — ABNORMAL HIGH (ref 6–20)
CALCIUM: 5.7 mg/dL — AB (ref 8.9–10.3)
CHLORIDE: 107 mmol/L (ref 101–111)
CO2: 20 mmol/L — AB (ref 22–32)
Creatinine, Ser: 2.05 mg/dL — ABNORMAL HIGH (ref 0.61–1.24)
GFR calc non Af Amer: 28 mL/min — ABNORMAL LOW (ref >60)
GFR, EST AFRICAN AMERICAN: 32 mL/min — AB (ref >60)
GLUCOSE: 178 mg/dL — AB (ref 65–99)
Potassium: 4.5 mmol/L (ref 3.5–5.1)
Sodium: 130 mmol/L — ABNORMAL LOW (ref 135–145)

## 2014-08-22 LAB — CBC
HCT: 25 % — ABNORMAL LOW (ref 40.0–52.0)
HEMATOCRIT: 24.2 % — AB (ref 40.0–52.0)
HEMOGLOBIN: 7.7 g/dL — AB (ref 13.0–18.0)
Hemoglobin: 7.6 g/dL — ABNORMAL LOW (ref 13.0–18.0)
MCH: 26.5 pg (ref 26.0–34.0)
MCH: 25.4 pg — AB (ref 26.0–34.0)
MCHC: 32 g/dL (ref 32.0–36.0)
MCHC: 30.6 g/dL — AB (ref 32.0–36.0)
MCV: 82.7 fL (ref 80.0–100.0)
MCV: 82.9 fL (ref 80.0–100.0)
PLATELETS: 134 10*3/uL — AB (ref 150–440)
Platelets: 129 10*3/uL — ABNORMAL LOW (ref 150–440)
RBC: 2.92 MIL/uL — AB (ref 4.40–5.90)
RBC: 3.01 MIL/uL — AB (ref 4.40–5.90)
RDW: 19 % — ABNORMAL HIGH (ref 11.5–14.5)
RDW: 19.4 % — ABNORMAL HIGH (ref 11.5–14.5)
WBC: 21.1 10*3/uL — ABNORMAL HIGH (ref 3.8–10.6)
WBC: 25 10*3/uL — ABNORMAL HIGH (ref 3.8–10.6)

## 2014-08-22 LAB — PHOSPHORUS: PHOSPHORUS: 2.1 mg/dL — AB (ref 2.5–4.6)

## 2014-08-22 LAB — URINE CULTURE: Culture: 60000

## 2014-08-22 LAB — HEPARIN LEVEL (UNFRACTIONATED)

## 2014-08-22 LAB — APTT: aPTT: >200 seconds (ref 24–36)

## 2014-08-22 LAB — ALBUMIN: Albumin: 2 g/dL — ABNORMAL LOW (ref 3.5–5.0)

## 2014-08-22 LAB — MAGNESIUM: Magnesium: 1.8 mg/dL (ref 1.7–2.4)

## 2014-08-22 MED ORDER — ASPIRIN 81 MG PO CHEW
81.0000 mg | CHEWABLE_TABLET | Freq: Every day | ORAL | Status: DC
  Administered 2014-08-22 – 2014-08-25 (×4): 81 mg via ORAL
  Filled 2014-08-22 (×4): qty 1

## 2014-08-22 MED ORDER — SODIUM CHLORIDE 0.9 % IV SOLN
250.0000 mg | Freq: Three times a day (TID) | INTRAVENOUS | Status: DC
  Administered 2014-08-22 – 2014-08-23 (×3): 250 mg via INTRAVENOUS
  Filled 2014-08-22 (×7): qty 250

## 2014-08-22 MED ORDER — SENNOSIDES-DOCUSATE SODIUM 8.6-50 MG PO TABS
2.0000 | ORAL_TABLET | Freq: Two times a day (BID) | ORAL | Status: DC
  Administered 2014-08-22 – 2014-08-23 (×3): 2 via ORAL
  Filled 2014-08-22 (×3): qty 2

## 2014-08-22 MED ORDER — SODIUM CHLORIDE 0.9 % IV SOLN
2.0000 g | Freq: Once | INTRAVENOUS | Status: AC
  Administered 2014-08-22: 2 g via INTRAVENOUS
  Filled 2014-08-22: qty 20

## 2014-08-22 MED ORDER — HEPARIN (PORCINE) IN NACL 100-0.45 UNIT/ML-% IJ SOLN
800.0000 [IU]/h | INTRAMUSCULAR | Status: DC
  Administered 2014-08-22: 200 [IU]/h via INTRAVENOUS
  Administered 2014-08-24: 800 [IU]/h via INTRAVENOUS
  Filled 2014-08-22 (×5): qty 250

## 2014-08-22 MED ORDER — FOLIC ACID 1 MG PO TABS
1.0000 mg | ORAL_TABLET | Freq: Every day | ORAL | Status: DC
  Administered 2014-08-22 – 2014-08-25 (×4): 1 mg via ORAL
  Filled 2014-08-22 (×4): qty 1

## 2014-08-22 MED ORDER — VITAL AF 1.2 CAL PO LIQD
1000.0000 mL | ORAL | Status: DC
  Administered 2014-08-22 – 2014-08-25 (×5): 1000 mL

## 2014-08-22 MED ORDER — METHYLPREDNISOLONE SODIUM SUCC 40 MG IJ SOLR
40.0000 mg | Freq: Two times a day (BID) | INTRAMUSCULAR | Status: DC
  Administered 2014-08-22 – 2014-08-23 (×3): 40 mg via INTRAVENOUS
  Filled 2014-08-22 (×3): qty 1

## 2014-08-22 MED ORDER — PIPERACILLIN-TAZOBACTAM 3.375 G IVPB
3.3750 g | Freq: Three times a day (TID) | INTRAVENOUS | Status: DC
  Filled 2014-08-22 (×4): qty 50

## 2014-08-22 MED ORDER — HEPARIN (PORCINE) IN NACL 100-0.45 UNIT/ML-% IJ SOLN
400.0000 [IU]/h | INTRAMUSCULAR | Status: DC
  Administered 2014-08-22: 400 [IU]/h via INTRAVENOUS
  Filled 2014-08-22: qty 250

## 2014-08-22 MED ORDER — SODIUM CHLORIDE 0.9 % IV SOLN
INTRAVENOUS | Status: DC
  Administered 2014-08-23: 2.5 [IU]/h via INTRAVENOUS
  Administered 2014-08-24: 5.1 [IU]/h via INTRAVENOUS
  Administered 2014-08-24: 4.4 [IU]/h via INTRAVENOUS
  Administered 2014-08-24: 2.2 [IU]/h via INTRAVENOUS
  Administered 2014-08-24: 4 [IU]/h via INTRAVENOUS
  Administered 2014-08-24: 1.4 [IU]/h via INTRAVENOUS
  Administered 2014-08-24: 2 [IU]/h via INTRAVENOUS
  Administered 2014-08-24: 2.1 [IU]/h via INTRAVENOUS
  Filled 2014-08-22 (×3): qty 2.5

## 2014-08-22 NOTE — Progress Notes (Signed)
taled with dr hower about pt heart rate and bp  Marcus Blevins, [place]

## 2014-08-22 NOTE — Progress Notes (Signed)
Spoke with dr Marcille Blanco about low calicum had [place] check a albuim being done Tamsen Snider, Therapist, sports

## 2014-08-22 NOTE — Progress Notes (Signed)
Nutrition Follow-up   INTERVENTION:   EN: recommend continuing TF at goal rate of 57 ml/hr, continue free water flush of 25 mL q 4 hours Coordination of Care: discussed lack of BM, MD to start constipation prevention protocol   NUTRITION DIAGNOSIS:   Inadequate oral intake related to acute illness as evidenced by NPO status.  GOAL:   Provide needs based on ASPEN/SCCM guidelines   MONITOR:    (Energy Intake, EN, Digestive System, Anthropometrics, Electrolyte/Renal profile, Glucose Profile)   ASSESSMENT:    Pt remains on vent, on levophed 7 mcg/min  Diet Order:  Diet NPO time specified Except for: Sips with Meds   EN: pt tolerating Vital 1.2 at goal rate  Digestive System: no signs of TF intolerance, no BM  Skin:  Reviewed, no issues  Electrolyte and Renal Profile:  Recent Labs Lab 08/20/14 0137 08/20/14 1037  08/21/14 0905 08/21/14 1732 08/22/14 0426  BUN 17  --   < > 24* 21* 23*  CREATININE 1.04  --   < > 1.93* 1.71* 2.05*  NA 136  --   < > 134* 132* 130*  K 3.9  --   < > 3.5 3.7 4.5  MG 1.5* 2.2  --   --   --  1.8  PHOS  --   --   --   --   --  2.1*  < > = values in this interval not displayed.  Corrected calcium 7.3 (plan to supplement per discussion during ICU rounds today)  Glucose Profile:  Recent Labs  08/22/14 0341 08/22/14 0717 08/22/14 1122  GLUCAP 176* 151* 179*   Protein Profile:  Recent Labs Lab 08/20/14 0137 08/20/14 1636 08/22/14 0426  ALBUMIN 2.6* 2.7* 2.0*   Meds: NS at 150 ml/hr, fentanyl, ss novolog, senokot, solumedrol  Height:   Ht Readings from Last 1 Encounters:  08/20/14 5\' 6"  (1.676 m)    Weight:   Wt Readings from Last 1 Encounters:  08/22/14 170 lb 10.2 oz (77.4 kg)    BMI:  Body mass index is 27.55 kg/(m^2).  Estimated Nutritional Needs:   Kcal:  1620 kcals (Ve: 9, Tmax: 37.8) using current wt of 65.3 kg  Protein:  98-130 g (1.5-2.0 g/kg)   Fluid:  1625-1950 mL (25-30  ml/kg)   HIGH Care  Level  Kerman Passey MS, RD, LDN [phone] Pager

## 2014-08-22 NOTE — Progress Notes (Signed)
ANTICOAGULATION CONSULT NOTE - Consult  Pharmacy Consult for Heparin Indication: chest pain/ACS  No Known Allergies  Patient Measurements: Height: 5\' 6"  (167.6 cm) Weight: 170 lb 10.2 oz (77.4 kg) IBW/kg (Calculated) : 63.8 Heparin Dosing Weight: 72.2 kg  Vital Signs: Temp: 99.4 F (37.4 C) (08/15 0815) Temp Source: Axillary (08/15 0815) BP: 102/63 mmHg (08/15 1400) Pulse Rate: 95 (08/15 1400)  Labs:  Recent Labs  08/20/14 0003 08/20/14 0137 08/20/14 0510 08/20/14 1037  08/21/14 0028 08/21/14 0905 08/21/14 1154 08/21/14 1732 08/21/14 2050 08/22/14 0426 08/22/14 0953  HGB  --  8.6*  --   --   --  7.5*  --   --   --   --  7.7*  --   HCT  --  27.6*  --   --   --  23.7*  --   --   --   --  24.2*  --   PLT  --  181  --   --   --  145*  --   --   --   --  129*  --   APTT  --  36  --   --   --   --   --  51*  --   --   --  >200*  LABPROT  --  15.4*  --   --   --   --   --  22.2*  --   --   --   --   INR  --  1.20  --   --   --   --   --  1.93  --   --   --   --   HEPARINUNFRC  --   --   --   --   --   --   --   --   --  2.00*  --  >3.60*  CREATININE  --  1.04  --   --   < >  --  1.93*  --  1.71*  --  2.05*  --   TROPONINI 1.03*  --  4.15* 6.44*  --   --   --   --   --   --   --   --   < > = values in this interval not displayed.  Estimated Creatinine Clearance: 25.8 mL/min (by C-G formula based on Cr of 2.05).   Medical History: Past Medical History  Diagnosis Date  . Cancer     prostate  . Heart disease   . Prostate cancer 2014    8 weeks of radiation, [facility]  . Hypertension   . Hyperlipidemia     Medications:  Scheduled:  . antiseptic oral rinse  7 mL Mouth Rinse QID  . aspirin  81 mg Oral Daily  . atorvastatin  20 mg Oral QHS  . budesonide  0.5 mg Nebulization BID  . chlorhexidine gluconate  15 mL Mouth Rinse BID  . darifenacin  7.5 mg Oral Daily  . folic acid  1 mg Oral Daily  . free water  25 mL Per Tube 6 times per day  . imipenem-cilastatin  250  mg Intravenous 3 times per day  . insulin aspart  2-6 Units Subcutaneous 6 times per day  . ipratropium-albuterol  3 mL Nebulization Q4H  . methylPREDNISolone (SOLU-MEDROL) injection  40 mg Intravenous Q12H  . senna-docusate  2 tablet Oral BID  . sodium chloride  3 mL Intravenous Q12H  . tamsulosin  0.4  mg Oral Daily    Assessment: Pharmacy consulted to dose heparin for 81 yo male being treated for possible ACS/NSTEMI. Patient previously on apixaban 5mg  BID with last dose being administered with am meds on 8/14. Patient has been receiving heparin drip at 800 units/hr (72ml/hr).   Goal of Therapy:  Heparin level 0.3-0.7 units/ml, aPTT 68-109 Monitor platelets by anticoagulation protocol: Yes   Plan:  Patients aPTT elevated. Heparin drip held from ~1130 to 1400. Patient restarted on heparin 400units/hr (57mL/hr). Will obtain follow-up aPTT at 1800. Will obtain anti-Xa level with am labs. Will need to continue to monitor via aPTT until anti-Xa and aPTT results correlate.   Pharmacy will continue to monitor and adjust per consult.     Simpson,Michael L 08/22/2014,3:01 PM

## 2014-08-22 NOTE — Progress Notes (Signed)
[place] [facility] [place] NAME: Marcus Blevins    MR#:  [id number]  DATE OF BIRTH:  Aug 11, 1927  SUBJECTIVE:  CHIEF COMPLAINT:   Chief Complaint  Patient presents with  . Fever  . Chills  . Cough   patient is a 81 year old Caucasian male with past medical history significant for history of metastatic prostate cancer who is undergoing chemotherapy who presents to the hospital with fevers, chills and cough. He deteriorated. In emergency room and he was intubated. Per family members. Patient has been having cough for approximately 3 days which was nonproductive. Chest x-ray done in the emergency room revealed left lower lobe opacity, however, subsequent x-rays revealed congestive heart failure. Patient was managed on fluid restriction and pressors. Patient is on Levothroid at 8 mics present. His urine output is satisfactory and his repeat the chest x-ray today showed that right base medially infiltrate, so small pleural effusions but no pulmonary edema. His kidney function deteriorated and his creatinine is 1.9 today. The pressure remains marginal. Patient is status post PICC line placement yesterday on 08/20/2014. to  Right arm. No review of systems as patient is intubated. The patient remains in atrial fibrillation with rate of 90-100 Remains on 8 mics of Levothroid, blood pressure seemed to be stable and somewhat improved. Remains on 50% FiO2. Urine culture was positive for ESBL Escherichia coli. Patient's Zosyn was discontinued, imipenem was initiated Review of Systems  Unable to perform ROS: intubated    VITAL SIGNS: Blood pressure 102/63, pulse 95, temperature 99.4 F (37.4 C), temperature source Axillary, resp. rate 20, height 5\' 6"  (1.676 m), weight 77.4 kg (170 lb 10.2 oz), SpO2 92 %.  PHYSICAL EXAMINATION:   GENERAL:  81 y.o.-year-old patient lying in the bed in mild respiratory distress, he is intubated, sedated, on numerous medications  quitting Precedex . Pupils are pinpoint. Cornell reflexes present EYES: Pupils equal, round, reactive to light and accommodation. No scleral icterus. Extraocular muscles intact.  HEENT: Head atraumatic, normocephalic. Oropharynx and nasopharynx clear. ET tube is in place as well as OG tube NECK:  Supple, no jugular venous distention. No thyroid enlargement, no tenderness.  LUNGS: Normal breath sounds bilaterally, no wheezing, numerous rales and  Crepitations noted bilaterally in both lung fields, more on the left. Intermittently uses accessory muscles of respiration.  CARDIOVASCULAR: S1, S2 normal. No murmurs, rubs, or gallops.  ABDOMEN: Soft, nontender, nondistended. Bowel sounds present. No organomegaly or mass.  EXTREMITIES: No pedal edema, cyanosis, or clubbing.  NEUROLOGIC: Cranial nerves II through XII not able to evaluate, but difficult to examine since patient is on the vent. Muscle strength not able to evaluate. Sensation not able to assess. Gait not checked.  PSYCHIATRIC: The patient is somnolent, sedated intermittently opens his eyes.  SKIN: No obvious rash, lesion, or ulcer.   ORDERS/RESULTS REVIEWED:   CBC  Recent Labs Lab 08/30/2014 2025 08/20/14 0137 08/21/14 0028 08/22/14 0426  WBC 32.9* 34.4* 21.8* 21.1*  HGB 9.3* 8.6* 7.5* 7.7*  HCT 29.3* 27.6* 23.7* 24.2*  PLT 196 181 145* 129*  MCV 83.2 82.6 82.6 82.7  MCH 26.4 25.8* 26.2 26.5  MCHC 31.8* 31.2* 31.7* 32.0  RDW 19.4* 19.2* 19.4* 19.0*  LYMPHSABS 0.3*  --   --   --   MONOABS 0.0*  --   --   --   EOSABS 0.0  --   --   --   BASOSABS 0.0  --   --   --    ------------------------------------------------------------------------------------------------------------------  Chemistries   Recent Labs Lab 08/24/2014 2025 08/20/14 0137 08/20/14 1037 08/20/14 1636 08/21/14 0905 08/21/14 1732 08/22/14 0426  NA 135 136  --  136 134* 132* 130*  K 3.2* 3.9  --  4.4 3.5 3.7 4.5  CL 107 108  --  108 106 106 107  CO2 18*  20*  --  22 21* 20* 20*  GLUCOSE 135* 167*  --  137* 197* 214* 178*  BUN 18 17  --  19 24* 21* 23*  CREATININE 0.99 1.04  --  1.63* 1.93* 1.71* 2.05*  CALCIUM 7.5* 6.9*  --  6.5* 6.0* 6.1* 5.7*  MG  --  1.5* 2.2  --   --   --  1.8  AST 30 38  --  60*  --   --   --   ALT 14* 14*  --  17  --   --   --   ALKPHOS 118 99  --  97  --   --   --   BILITOT 0.4 <0.1*  --  0.4  --   --   --    ------------------------------------------------------------------------------------------------------------------ estimated creatinine clearance is 25.8 mL/min (by C-G formula based on Cr of 2.05). ------------------------------------------------------------------------------------------------------------------ No results for input(s): TSH, T4TOTAL, T3FREE, THYROIDAB in the last 72 hours.  Invalid input(s): FREET3  Cardiac Enzymes  Recent Labs Lab 08/20/14 0003 08/20/14 0510 08/20/14 1037  TROPONINI 1.03* 4.15* 6.44*   ------------------------------------------------------------------------------------------------------------------ Invalid input(s): POCBNP ---------------------------------------------------------------------------------------------------------------  RADIOLOGY: Dg Chest Port 1 View  08/21/2014   CLINICAL DATA:  Acute respiratory failure, intubated  EXAM: PORTABLE CHEST - 1 VIEW  COMPARISON:  08/20/2014  FINDINGS: Cardiomediastinal silhouette is stable. Endotracheal tube and NG tube are unchanged in position. Again noted right arm PICC line with tip in SVC. There is hazy atelectasis or infiltrate right base medially. Probable small left pleural effusion with left basilar atelectasis or infiltrate. No convincing pulmonary edema. Degenerative changes bilateral shoulders.  IMPRESSION: Endotracheal tube and NG tube are unchanged in position. Again noted right arm PICC line with tip in SVC. There is hazy atelectasis or infiltrate right base medially. Probable small left pleural effusion with  left basilar atelectasis or infiltrate. No convincing pulmonary edema.   Electronically Signed   By: Lahoma Crocker M.D.   On: 08/21/2014 09:40   Dg Chest Port 1 View  08/20/2014   CLINICAL DATA:  Status post right PICC line placement  EXAM: PORTABLE CHEST - 1 VIEW  COMPARISON:  09/04/2014  FINDINGS: Cardiac shadow is stable. An endotracheal tube and nasogastric catheter are again identified and stable. A new right-sided PICC line is noted in the mid to distal superior vena cava in satisfactory position. The lungs are well aerated bilaterally with slight improved aeration in the basis when compared with the prior exam.  IMPRESSION: Improved aeration in the bases.  Status post PICC line placement in satisfactory position.   Electronically Signed   By: Inez Catalina M.D.   On: 08/20/2014 17:59    EKG:  Orders placed or performed during the hospital encounter of 08/27/2014  . ED EKG 12-Lead  . ED EKG 12-Lead  . EKG 12-Lead  . EKG 12-Lead  . EKG 12-Lead  . EKG 12-Lead  . EKG 12-Lead  . EKG 12-Lead    ASSESSMENT AND PLAN:  Active Problems:   Severe sepsis   Acute respiratory failure with hypoxia   A-fib 1. Severe sepsis due to left lower lobe and possible right lower lobe  pneumonia, discontinue vancomycin , Zosyn , Zithromax , initiate patient on imipenem , awaiting for tracheal aspirate cultures and if medication, possibly the same as urinary tract infection, ESBL Escherichia coli. However, identification is above is pending.  Blood cultures and negative 2. Left lower lobe pneumonia, as above. Broad-spectrum antibiotic therapy. Cultures. Adjust antibiotics depending on culture results 3. Acute pulmonary edema due to acute systolic congestive heart failure likely due to fluid overload during resuscitation, Echocardiogram showed ejection fraction of 30-35%, global hypokinesis. IV fluids were restricted initially. However, patient's kidney function became abnormal , reinitiated on IV fluids which patient  is tolerating well , follow kidney function as well as respiratory status very closely Trying to balance ins and outs  4. Septic shock. Continue levofed keeping map is around 65 and above, remains on same dose of Levothroid as yesterday 5. Non-Q-wave MI, type II, likely due to demand ischemia. Echocardiogram reveals ejection fraction of 30-35%. Cardiology consultation is appreciated. Unable to use metoprolol or nitroglycerin due to hypotension, septic shock. Patient to be continued on a heparin IV drip.  Eliquis was discontinued  6. Acute respiratory failure due to pneumonia as well as acute pulmonary edema. Continue mechanical ventilation, now on 50% FiO2. Appreciate pulmonary input. Follow on advanced IV fluids 7. Leukocytosis. Improved with antibiotic therapy 8. Lactic metabolic acidosis likely due to poor peripheral perfusion. Status post PICC line lay cement 13th of August 2016. Continue IV fluids as well as levo fed keeping his map is around 65 and above to improve peripheral perfusion 9. Atrial fibrillation, atrial flutter. Now on heparin IV, questionable amiodarone to better control heart rate is as it remains in the 100s 10. Acute renal failure, advanced IV fluids and follow patient's kidney function tomorrow morning following for recurrence of acute systolic CHF, acute pulmonary edema.  Management plans discussed with the patient, family and they are in agreement.   DRUG ALLERGIES: No Known Allergies  CODE STATUS:     Code Status Orders        Start     Ordered   08/21/2014 2127  Full code   Continuous     08/23/2014 2127    Advance Directive Documentation        Most Recent Value   Type of Advance Directive  Healthcare Power of Attorney, Living will   Pre-existing out of facility DNR order (yellow form or pink MOST form)     "MOST" Form in Place?        TOTAL CRITICAL CARE TIME TAKING CARE OF THIS PATIENT: 50 minutes.  Discussed extensively with patient's family as well as his  wife as well as Dr. Madelaine Bhat M.D on 08/22/2014 at 3:11 PM  Between 7am to 6pm - Pager - [phone]  After 6pm go to [email or web address] - password [email or web address] [facility]  Office  [phone]  CC: Primary care physician; Lelon Huh, MD

## 2014-08-22 NOTE — Progress Notes (Signed)
ANTICOAGULATION CONSULT NOTE - Follow Up Consult  Pharmacy Consult for Heparin Indication: chest pain/ACS  No Known Allergies  Patient Measurements: Height: 5\' 6"  (167.6 cm) Weight: 170 lb 10.2 oz (77.4 kg) IBW/kg (Calculated) : 63.8 Heparin Dosing Weight: 72.2 kg  Vital Signs: Temp: 97.6 F (36.4 C) (08/15 1930) Temp Source: Axillary (08/15 1930) BP: 90/50 mmHg (08/15 1800) Pulse Rate: 87 (08/15 1800)  Labs:  Recent Labs  08/20/14 0003  08/20/14 0137 08/20/14 0510 08/20/14 1037  08/21/14 0028 08/21/14 0905 08/21/14 1154 08/21/14 1732 08/21/14 2050 08/22/14 0426 08/22/14 0953 08/22/14 1749  HGB  --   --  8.6*  --   --   --  7.5*  --   --   --   --  7.7*  --  7.6*  HCT  --   --  27.6*  --   --   --  23.7*  --   --   --   --  24.2*  --  25.0*  PLT  --   --  181  --   --   --  145*  --   --   --   --  129*  --  134*  APTT  --   < > 36  --   --   --   --   --  51*  --   --   --  >200* >200*  LABPROT  --   --  15.4*  --   --   --   --   --  22.2*  --   --   --   --   --   INR  --   --  1.20  --   --   --   --   --  1.93  --   --   --   --   --   HEPARINUNFRC  --   --   --   --   --   --   --   --   --   --  2.00*  --  >3.60*  --   CREATININE  --   --  1.04  --   --   < >  --  1.93*  --  1.71*  --  2.05*  --   --   TROPONINI 1.03*  --   --  4.15* 6.44*  --   --   --   --   --   --   --   --   --   < > = values in this interval not displayed.  Estimated Creatinine Clearance: 25.8 mL/min (by C-G formula based on Cr of 2.05).   Medications:  Scheduled:  . antiseptic oral rinse  7 mL Mouth Rinse QID  . aspirin  81 mg Oral Daily  . atorvastatin  20 mg Oral QHS  . budesonide  0.5 mg Nebulization BID  . chlorhexidine gluconate  15 mL Mouth Rinse BID  . darifenacin  7.5 mg Oral Daily  . folic acid  1 mg Oral Daily  . free water  25 mL Per Tube 6 times per day  . imipenem-cilastatin  250 mg Intravenous 3 times per day  . insulin aspart  2-6 Units Subcutaneous 6 times per  day  . ipratropium-albuterol  3 mL Nebulization Q4H  . methylPREDNISolone (SOLU-MEDROL) injection  40 mg Intravenous Q12H  . senna-docusate  2 tablet Oral BID  . sodium chloride  3 mL Intravenous  Q12H  . tamsulosin  0.4 mg Oral Daily   Infusions:  . dexmedetomidine 0.7 mcg/kg/hr (08/22/14 1400)  . feeding supplement (VITAL AF 1.2 CAL) 1,000 mL (08/22/14 1402)  . fentaNYL infusion INTRAVENOUS 175 mcg/hr (08/22/14 1649)  . heparin    . norepinephrine 6 mcg/min (08/22/14 1434)   PRN: acetaminophen **OR** acetaminophen, fentaNYL (SUBLIMAZE) injection, ondansetron **OR** ondansetron (ZOFRAN) IV, sennosides  Assessment: Pharmacy consulted to dose heparin for 81 yo male being treated for possible ACS/NSTEMI. Patient previously on apixaban 5mg  BID with last dose being administered with am meds on 8/14. Heparin drip is running at 400 units/hr.   Goal of Therapy:  Heparin level 0.3-0.7 units/ml, aPTT 68-109 Monitor platelets by anticoagulation protocol: Yes   Plan:  APTT remains elevated so will hold heparin drip for 2 hours and resume at 200 units/hr. Will recheck aPTT/HL 8 hours after resuming infusion.   Ulice Dash D 08/22/2014,8:05 PM

## 2014-08-22 NOTE — Progress Notes (Signed)
PULMONARY / CRITICAL CARE MEDICINE   Name: Marcus Blevins MRN: [id number] DOB: [birth year]    ADMISSION DATE:  08/20/2014 CONSULTATION DATE: 08/20/14  REFERRING MD : Dr. Theodoro Grist [facility] - [facility]   CHIEF COMPLAINT:   Follow up  Fever, chills and cough resp failure    SIGNIFICANT EVENTS  Patient intubated and sedated, remains on fio2 50%, patient remains critically ill   No significant events overnight, sedated and intubated on MV. Requiring mild dose of pressors    PAST MEDICAL HISTORY    :  Past Medical History  Diagnosis Date  . Cancer     prostate  . Heart disease   . Prostate cancer 2014    8 weeks of radiation, [facility]  . Hypertension   . Hyperlipidemia    Past Surgical History  Procedure Laterality Date  . Colonoscopy  2007  . Cholecystectomy  1958  . Bladder scraping  03-15-14    [facility], for bladder cancer  . Tumor removed from bladder     Prior to Admission medications   Medication Sig Start Date End Date Taking? Authorizing Provider  albuterol (PROVENTIL HFA;VENTOLIN HFA) 108 (90 BASE) MCG/ACT inhaler Inhale 2 puffs into the lungs every 6 (six) hours as needed for wheezing. 08/03/14  Yes Birdie Sons, MD  amLODipine (NORVASC) 5 MG tablet Take 5 mg by mouth daily.   Yes Historical Provider, MD  aspirin EC 81 MG tablet Take 81 mg by mouth daily.   Yes Historical Provider, MD  atenolol (TENORMIN) 50 MG tablet Take 50 mg by mouth daily.   Yes Historical Provider, MD  atorvastatin (LIPITOR) 20 MG tablet Take 20 mg by mouth at bedtime.   Yes Historical Provider, MD  Calcium Carbonate-Vitamin D (CALCIUM 600+D) 600-400 MG-UNIT per tablet Take 1 tablet by mouth daily.   Yes Historical Provider, MD  clotrimazole-betamethasone (LOTRISONE) cream Apply 1 application topically 2 (two) times daily.   Yes Historical Provider, MD  dexamethasone (DECADRON) 4 MG tablet Take 8 mg by mouth 2 (two) times daily. Pt only takes the day before and day after  DOCEtaxel.   Yes Historical Provider, MD  Diphenhyd-Hydrocort-Nystatin (FIRST-DUKES MOUTHWASH) SUSP Use as directed 30 mLs in the mouth or throat as needed (for mouth sores).   Yes Historical Provider, MD  diphenhydramine-acetaminophen (TYLENOL PM) 25-500 MG TABS Take 1 tablet by mouth at bedtime as needed (for sleep).   Yes Historical Provider, MD  fluticasone (FLONASE) 50 MCG/ACT nasal spray Place 1 spray into both nostrils daily.   Yes Historical Provider, MD  folic acid (FOLVITE) 160 MCG tablet Take 800 mcg by mouth daily.   Yes Historical Provider, MD  leuprolide, 6 Month, (ELIGARD) 45 MG injection Inject 45 mg into the skin every 6 (six) months.   Yes Historical Provider, MD  Melatonin 5 MG TABS Take 1 tablet by mouth at bedtime as needed (for sleep).    Yes Historical Provider, MD  mometasone (ASMANEX) 220 MCG/INH inhaler Inhale 1 puff into the lungs 2 (two) times daily.   Yes Historical Provider, MD  montelukast (SINGULAIR) 10 MG tablet Take 10 mg by mouth at bedtime.   Yes Historical Provider, MD  prochlorperazine (COMPAZINE) 10 MG tablet Take 10 mg by mouth every 6 (six) hours as needed for nausea or vomiting.   Yes Historical Provider, MD  pyridOXINE (VITAMIN B-6) 100 MG tablet Take 100 mg by mouth daily.   Yes Historical Provider, MD  solifenacin (VESICARE) 10 MG tablet Take  10 mg by mouth at bedtime.   Yes Historical Provider, MD  tamsulosin (FLOMAX) 0.4 MG CAPS capsule Take 0.4 mg by mouth daily.   Yes Historical Provider, MD  temazepam (RESTORIL) 7.5 MG capsule Take 7.5 mg by mouth at bedtime as needed for sleep.   Yes Historical Provider, MD  vitamin B-12 (CYANOCOBALAMIN) 1000 MCG tablet Take 1,000 mcg by mouth daily.   Yes Historical Provider, MD  hyoscyamine (LEVSIN SL) 0.125 MG SL tablet Place 1 tablet (0.125 mg total) under the tongue every 4 (four) hours as needed. Patient not taking: Reported on 08/12/2014 07/20/14   Birdie Sons, MD   No Known Allergies   FAMILY HISTORY    Family History  Problem Relation Age of Onset  . CVA Mother   . Heart attack Father       SOCIAL HISTORY    reports that he quit smoking about 60 years ago. He has never used smokeless tobacco. He reports that he does not drink alcohol or use illicit drugs.  ROS- unable to obtain - patient intubated and sedated    VITAL SIGNS    Temp:  [96.3 F (35.7 C)-100.8 F (38.2 C)] 98.5 F (36.9 C) (08/15 0630) Pulse Rate:  [85-111] 97 (08/15 0630) Resp:  [17-21] 20 (08/15 0630) BP: (81-110)/(48-63) 93/53 mmHg (08/15 0630) SpO2:  [88 %-100 %] 95 % (08/15 0630) FiO2 (%):  [40 %-50 %] 50 % (08/15 0505) Weight:  [170 lb 10.2 oz (77.4 kg)] 170 lb 10.2 oz (77.4 kg) (08/15 0400) HEMODYNAMICS:   VENTILATOR SETTINGS: Vent Mode:  [-] PRVC FiO2 (%):  [40 %-50 %] 50 % Set Rate:  [20 bmp] 20 bmp Vt Set:  [530 mL] 530 mL PEEP:  [5 cmH20] 5 cmH20 INTAKE / OUTPUT:  Intake/Output Summary (Last 24 hours) at 08/22/14 0836 Last data filed at 08/22/14 0600  Gross per 24 hour  Intake 3417.77 ml  Output   1580 ml  Net 1837.77 ml       PHYSICAL EXAM   Physical Exam GENERAL:sedated on the MV, no acute on the vent.  HEAD: Normocephalic, atraumatic.  EYES: Pupils equal, round, reactive to light. Unable to assess extraocular muscles given mental status/medical condition. No scleral icterus.  MOUTH: Moist mucosal membrane. Dentition intact. No abscess noted.  EAR, NOSE, THROAT: Clear without exudates. No external lesions.  NECK: Supple. No thyromegaly. No nodules. No JVD.  PULMONARY: Coarse breath sounds with scattered rhonchi right lung field, intubated/mechanical ventilation CARDIOVASCULAR: S1 and S2. Tachycardic No murmurs, rubs, or gallops. No edema. Pedal pulses 2+ bilaterally.  GASTROINTESTINAL: Soft, nontender, nondistended. No masses. Positive bowel sounds. No hepatosplenomegaly.  MUSCULOSKELETAL: No swelling, clubbing, or edema. Range of motion full in all extremities.   NEUROLOGIC: Unable to assess given mental status/medical condition GCs<8T SKIN: No ulceration, lesions, rashes, or cyanosis. Skin warm and dry. Turgor intact.     LABS   LABS:  CBC  Recent Labs Lab 08/20/14 0137 08/21/14 0028 08/22/14 0426  WBC 34.4* 21.8* 21.1*  HGB 8.6* 7.5* 7.7*  HCT 27.6* 23.7* 24.2*  PLT 181 145* 129*   Coag's  Recent Labs Lab 08/20/14 0137 08/21/14 1154  APTT 36 51*  INR 1.20 1.93   BMET  Recent Labs Lab 08/21/14 0905 08/21/14 1732 08/22/14 0426  NA 134* 132* 130*  K 3.5 3.7 4.5  CL 106 106 107  CO2 21* 20* 20*  BUN 24* 21* 23*  CREATININE 1.93* 1.71* 2.05*  GLUCOSE 197* 214* 178*  Electrolytes  Recent Labs Lab 08/20/14 0137 08/20/14 1037  08/21/14 0905 08/21/14 1732 08/22/14 0426  CALCIUM 6.9*  --   < > 6.0* 6.1* 5.7*  MG 1.5* 2.2  --   --   --   --   < > = values in this interval not displayed. Sepsis Markers  Recent Labs Lab 08/20/14 0003 08/20/14 1534 08/21/14 0028  LATICACIDVEN 2.2* 1.5 1.6   ABG  Recent Labs Lab 08/20/14 1735 08/20/14 1917 08/21/14 0505  PHART 7.19* 7.23* 7.30*  PCO2ART 51* 45 37  PO2ART 76* 63* 126*   Liver Enzymes  Recent Labs Lab 08/22/2014 2025 08/20/14 0137 08/20/14 1636 08/22/14 0426  AST 30 38 60*  --   ALT 14* 14* 17  --   ALKPHOS 118 99 97  --   BILITOT 0.4 <0.1* 0.4  --   ALBUMIN 3.0* 2.6* 2.7* 2.0*   Cardiac Enzymes  Recent Labs Lab 08/20/14 0003 08/20/14 0510 08/20/14 1037  TROPONINI 1.03* 4.15* 6.44*   Glucose  Recent Labs Lab 08/21/14 1208 08/21/14 1555 08/21/14 1941 08/22/14 0003 08/22/14 0341 08/22/14 0717  GLUCAP 220* 201* 197* 160* 176* 151*     Recent Results (from the past 240 hour(s))  MRSA PCR Screening     Status: None   Collection Time: 09/02/2014  6:02 PM  Result Value Ref Range Status   MRSA by PCR NEGATIVE NEGATIVE Final    Comment:        The GeneXpert MRSA Assay (FDA approved for NASAL specimens only), is one component of  a comprehensive MRSA colonization surveillance program. It is not intended to diagnose MRSA infection nor to guide or monitor treatment for MRSA infections.   Blood Culture (routine x 2)     Status: None (Preliminary result)   Collection Time: 08/20/2014  7:15 PM  Result Value Ref Range Status   Specimen Description BLOOD RIGHT ARM  Final   Special Requests BOTTLES DRAWN AEROBIC AND ANAEROBIC 4CC  Final   Culture NO GROWTH 3 DAYS  Final   Report Status PENDING  Incomplete  Blood Culture (routine x 2)     Status: None (Preliminary result)   Collection Time: 08/22/2014  7:25 PM  Result Value Ref Range Status   Specimen Description BLOOD LEFT ASSIST CONTROL  Final   Special Requests BOTTLES DRAWN AEROBIC AND ANAEROBIC 4CC  Final   Culture NO GROWTH 3 DAYS  Final   Report Status PENDING  Incomplete  Urine culture     Status: None (Preliminary result)   Collection Time: 08/28/2014  8:25 PM  Result Value Ref Range Status   Specimen Description URINE, RANDOM  Final   Special Requests NONE  Final   Culture   Final    60,000 COLONIES/ml GRAM NEGATIVE RODS IDENTIFICATION AND SUSCEPTIBILITIES TO FOLLOW    Report Status PENDING  Incomplete  Culture, expectorated sputum-assessment     Status: None   Collection Time: 08/20/14 10:05 AM  Result Value Ref Range Status   Specimen Description EXPECTORATED SPUTUM  Final   Special Requests Normal  Final   Sputum evaluation THIS SPECIMEN IS ACCEPTABLE FOR SPUTUM CULTURE  Final   Report Status 08/20/2014 FINAL  Final  Culture, respiratory (NON-Expectorated)     Status: None (Preliminary result)   Collection Time: 08/20/14 10:05 AM  Result Value Ref Range Status   Specimen Description EXPECTORATED SPUTUM  Final   Special Requests Normal Reflexed from [id number]  Final   Gram Stain PENDING  Incomplete  Culture   Final    LIGHT GROWTH GRAM NEGATIVE RODS IDENTIFICATION AND SUSCEPTIBILITIES TO FOLLOW    Report Status PENDING  Incomplete     Current  facility-administered medications:  .  0.9 %  sodium chloride infusion, , Intravenous, Continuous, Theodoro Grist, MD, Last Rate: 75 mL/hr at 08/22/14 0600 .  acetaminophen (TYLENOL) tablet 650 mg, 650 mg, Oral, Q6H PRN, 650 mg at 08/22/14 0436 **OR** acetaminophen (TYLENOL) suppository 650 mg, 650 mg, Rectal, Q6H PRN, Lytle Butte, MD .  antiseptic oral rinse solution (CORINZ), 7 mL, Mouth Rinse, QID, Theodoro Grist, MD, 7 mL at 08/22/14 0348 .  aspirin EC tablet 81 mg, 81 mg, Oral, Daily, Lytle Butte, MD, 81 mg at 08/21/14 1046 .  atorvastatin (LIPITOR) tablet 20 mg, 20 mg, Oral, QHS, Lytle Butte, MD, 20 mg at 08/21/14 2201 .  azithromycin (ZITHROMAX) 500 mg in dextrose 5 % 250 mL IVPB, 500 mg, Intravenous, Q24H, Theodoro Grist, MD, 500 mg at 08/21/14 1043 .  budesonide (PULMICORT) nebulizer solution 0.5 mg, 0.5 mg, Nebulization, BID, Lytle Butte, MD, 0.5 mg at 08/22/14 0752 .  chlorhexidine gluconate (PERIDEX) 0.12 % solution 15 mL, 15 mL, Mouth Rinse, BID, Theodoro Grist, MD, 15 mL at 08/21/14 2200 .  darifenacin (ENABLEX) 24 hr tablet 7.5 mg, 7.5 mg, Oral, Daily, Lytle Butte, MD, 7.5 mg at 08/21/14 1046 .  dexmedetomidine (PRECEDEX) 400 MCG/100ML (4 mcg/mL) infusion, 0.4-1.2 mcg/kg/hr, Intravenous, Titrated, Theodoro Grist, MD, Last Rate: 12.6 mL/hr at 08/22/14 0600, 0.772 mcg/kg/hr at 08/22/14 0600 .  feeding supplement (VITAL AF 1.2 CAL) liquid 1,000 mL, 1,000 mL, Per Tube, Continuous, Vishal Mungal, MD, Last Rate: 56 mL/hr at 08/22/14 0600, 1,000 mL at 08/22/14 0600 .  fentaNYL (SUBLIMAZE) injection 50 mcg, 50 mcg, Intravenous, Q2H PRN, Vishal Mungal, MD .  fentaNYL 2568mcg in NS 24mL (75mcg/ml) infusion-PREMIX, 10 mcg/hr, Intravenous, Continuous, Theodoro Grist, MD, Last Rate: 20 mL/hr at 08/22/14 0600, 200 mcg/hr at 08/22/14 0600 .  free water 25 mL, 25 mL, Per Tube, 6 times per day, Vishal Mungal, MD, 25 mL at 08/22/14 0400 .  heparin ADULT infusion 100 units/mL (25000 units/250 mL),  800 Units/hr, Intravenous, Continuous, Theodoro Grist, MD, Last Rate: 8 mL/hr at 08/22/14 0600, 800 Units/hr at 08/22/14 0600 .  insulin aspart (novoLOG) injection 2-6 Units, 2-6 Units, Subcutaneous, 6 times per day, Vilinda Boehringer, MD, 4 Units at 08/22/14 0418 .  ipratropium-albuterol (DUONEB) 0.5-2.5 (3) MG/3ML nebulizer solution 3 mL, 3 mL, Nebulization, Q4H, Lytle Butte, MD, 3 mL at 08/22/14 0752 .  midazolam (VERSED) 50 mg in sodium chloride 0.9 % 50 mL (1 mg/mL) infusion, 1 mg/hr, Intravenous, Continuous, Theodoro Grist, MD, Stopped at 08/21/14 1047 .  montelukast (SINGULAIR) tablet 10 mg, 10 mg, Oral, QHS, Lytle Butte, MD, 10 mg at 08/21/14 2200 .  norepinephrine (LEVOPHED) 4mg  in D5W 266mL premix infusion, 0-40 mcg/min, Intravenous, Titrated, Theodoro Grist, MD, Last Rate: 22.5 mL/hr at 08/22/14 0600, 6 mcg/min at 08/22/14 0600 .  ondansetron (ZOFRAN) tablet 4 mg, 4 mg, Oral, Q6H PRN **OR** ondansetron (ZOFRAN) injection 4 mg, 4 mg, Intravenous, Q6H PRN, Lytle Butte, MD .  piperacillin-tazobactam (ZOSYN) IVPB 3.375 g, 3.375 g, Intravenous, Q12H, Theodoro Grist, MD, 3.375 g at 08/21/14 1842 .  sennosides (SENOKOT) 8.8 MG/5ML syrup 5 mL, 5 mL, Per Tube, QHS PRN, Vishal Mungal, MD .  sodium chloride 0.9 % injection 3 mL, 3 mL, Intravenous, Q12H, Lytle Butte, MD, 3 mL at 08/21/14  2201 .  tamsulosin (FLOMAX) capsule 0.4 mg, 0.4 mg, Oral, Daily, Lytle Butte, MD, 0.4 mg at 08/21/14 1000 .  vancomycin (VANCOCIN) IVPB 1000 mg/200 mL premix, 1,000 mg, Intravenous, Q18H, Theodoro Grist, MD, 1,000 mg at 08/21/14 1921  IMAGING    No results found.   A\P  81 yo male admitted for sepsis, respiratory failure, intubated, past medical history of prostate cancer currently receiving chemotherapy Patient with acute pneumonia with COPD exacerbation with CHF exacerbation  PULMONARY  A: Acute respiratory failure-COPD exacerbation Pulmonary edema Respiratory failure requiring mechanical  ventilation Ventilator dyssynchrony P:  Continue with mechanical ventilation, wean as tolerated, assess for SBT daily Sedation/analgesia-stop versed gtt, stop fentanyl gtt - BP is being affected, start precedex gtt and use fentanyl PRN for analgeia Follow-up respiratory cultures - light chain GNR - on Vanc\Zosyn\Azithro -start steroids  CARDIOVASCULAR CVL - Right PICC ([facility]) 8/13 A:  Elevated troponin Non-STEMI P:  Follow-up cardiac enzymes Continue with heparin drip Follow Cardiology consultation Possibly demand ischemia from respiratory failure ECHO 8/13 - EF 30-35%  RENAL A:  Electrolyte abnormalities - hypokalemia, hypocalcemia  P:  Replacement at electrolytes per ICU protocol Monitor I/O Keep fluid level balance Lasix as needed given the presence of vascular congestion on recent chest x-ray  GASTROINTESTINAL P:  Continue with PPI for stress ulcer prophylaxis Dietary consult Started tube feeds  HEMATOLOGIC A:  Leukocytosis Sepsis P:  Elevated white blood count possibly due to sepsis versus leukemoid reaction Continue to monitor CBC Continue with current antibiotics   INFECTIOUS A: Sepsis P:  BCx2 8/12>>NTD UC 8/12>>NTD Sputum 8/12>>light chain GNR Abx: Vanc/Zosyn, start date 8/12,  \Azithro, start date 8/13   NEUROLOGIC P: Intubated and sedated RASS goal: -1 to -2 - recommend to change to precedex gtt, and prn Fentanyl, given pressor requirement and low BP.     I have personally obtained a history, examined the patient, evaluated Pertinent laboratory and RadioGraphic/imaging results, and  formulated the assessment and plan   The Patient requires high complexity decision making for assessment and support, frequent evaluation and titration of therapies, application of advanced monitoring technologies and extensive interpretation of multiple databases. Critical Care Time devoted to patient care services described in this  note is 45  minutes.   Overall, patient is critically ill, prognosis is guarded.  Patient with Multiorgan failure and at high risk for cardiac arrest and death.    Corrin Parker, M.D.  [facility]  Medical Director [place] Director [place] [facility]       08/22/2014, 8:36 AM

## 2014-08-23 ENCOUNTER — Inpatient Hospital Stay: Payer: Medicare Other

## 2014-08-23 DIAGNOSIS — N179 Acute kidney failure, unspecified: Secondary | ICD-10-CM | POA: Insufficient documentation

## 2014-08-23 DIAGNOSIS — J9600 Acute respiratory failure, unspecified whether with hypoxia or hypercapnia: Secondary | ICD-10-CM | POA: Insufficient documentation

## 2014-08-23 DIAGNOSIS — J9601 Acute respiratory failure with hypoxia: Secondary | ICD-10-CM

## 2014-08-23 LAB — RENAL FUNCTION PANEL
ALBUMIN: 2 g/dL — AB (ref 3.5–5.0)
Anion gap: 6 (ref 5–15)
BUN: 42 mg/dL — AB (ref 6–20)
CO2: 20 mmol/L — ABNORMAL LOW (ref 22–32)
CREATININE: 2.59 mg/dL — AB (ref 0.61–1.24)
Calcium: 6.2 mg/dL — CL (ref 8.9–10.3)
Chloride: 106 mmol/L (ref 101–111)
GFR calc Af Amer: 24 mL/min — ABNORMAL LOW (ref >60)
GFR, EST NON AFRICAN AMERICAN: 21 mL/min — AB (ref >60)
Glucose, Bld: 104 mg/dL — ABNORMAL HIGH (ref 65–99)
PHOSPHORUS: 4 mg/dL (ref 2.5–4.6)
POTASSIUM: 4.5 mmol/L (ref 3.5–5.1)
Sodium: 132 mmol/L — ABNORMAL LOW (ref 135–145)

## 2014-08-23 LAB — CBC
HCT: 28 % — ABNORMAL LOW (ref 40.0–52.0)
Hemoglobin: 8.7 g/dL — ABNORMAL LOW (ref 13.0–18.0)
MCH: 25.8 pg — ABNORMAL LOW (ref 26.0–34.0)
MCHC: 31.2 g/dL — AB (ref 32.0–36.0)
MCV: 82.7 fL (ref 80.0–100.0)
PLATELETS: 231 10*3/uL (ref 150–440)
RBC: 3.39 MIL/uL — ABNORMAL LOW (ref 4.40–5.90)
RDW: 19.2 % — AB (ref 11.5–14.5)
WBC: 32.6 10*3/uL — ABNORMAL HIGH (ref 3.8–10.6)

## 2014-08-23 LAB — BASIC METABOLIC PANEL
ANION GAP: 8 (ref 5–15)
BUN: 37 mg/dL — AB (ref 6–20)
CALCIUM: 6 mg/dL — AB (ref 8.9–10.3)
CO2: 17 mmol/L — ABNORMAL LOW (ref 22–32)
CREATININE: 2.89 mg/dL — AB (ref 0.61–1.24)
Chloride: 103 mmol/L (ref 101–111)
GFR calc Af Amer: 21 mL/min — ABNORMAL LOW (ref >60)
GFR, EST NON AFRICAN AMERICAN: 18 mL/min — AB (ref >60)
GLUCOSE: 313 mg/dL — AB (ref 65–99)
Potassium: 5 mmol/L (ref 3.5–5.1)
Sodium: 128 mmol/L — ABNORMAL LOW (ref 135–145)

## 2014-08-23 LAB — GLUCOSE, CAPILLARY
GLUCOSE-CAPILLARY: 322 mg/dL — AB (ref 65–99)
GLUCOSE-CAPILLARY: 286 mg/dL — AB (ref 65–99)
GLUCOSE-CAPILLARY: 255 mg/dL — AB (ref 65–99)
GLUCOSE-CAPILLARY: 266 mg/dL — AB (ref 65–99)
GLUCOSE-CAPILLARY: 254 mg/dL — AB (ref 65–99)
GLUCOSE-CAPILLARY: 122 mg/dL — AB (ref 65–99)
GLUCOSE-CAPILLARY: 135 mg/dL — AB (ref 65–99)
GLUCOSE-CAPILLARY: 152 mg/dL — AB (ref 65–99)
Glucose-Capillary: 298 mg/dL — ABNORMAL HIGH (ref 65–99)
Glucose-Capillary: 334 mg/dL — ABNORMAL HIGH (ref 65–99)
Glucose-Capillary: 332 mg/dL — ABNORMAL HIGH (ref 65–99)
Glucose-Capillary: 245 mg/dL — ABNORMAL HIGH (ref 65–99)
Glucose-Capillary: 215 mg/dL — ABNORMAL HIGH (ref 65–99)
Glucose-Capillary: 208 mg/dL — ABNORMAL HIGH (ref 65–99)
Glucose-Capillary: 186 mg/dL — ABNORMAL HIGH (ref 65–99)
Glucose-Capillary: 104 mg/dL — ABNORMAL HIGH (ref 65–99)
Glucose-Capillary: 109 mg/dL — ABNORMAL HIGH (ref 65–99)
Glucose-Capillary: 110 mg/dL — ABNORMAL HIGH (ref 65–99)
Glucose-Capillary: 116 mg/dL — ABNORMAL HIGH (ref 65–99)
Glucose-Capillary: 177 mg/dL — ABNORMAL HIGH (ref 65–99)
Glucose-Capillary: 184 mg/dL — ABNORMAL HIGH (ref 65–99)
Glucose-Capillary: 152 mg/dL — ABNORMAL HIGH (ref 65–99)

## 2014-08-23 LAB — CULTURE, RESPIRATORY W GRAM STAIN: Special Requests: NORMAL

## 2014-08-23 LAB — CULTURE, RESPIRATORY

## 2014-08-23 LAB — MAGNESIUM
MAGNESIUM: 2.1 mg/dL (ref 1.7–2.4)
Magnesium: 2.1 mg/dL (ref 1.7–2.4)

## 2014-08-23 LAB — HEMOGLOBIN: HEMOGLOBIN: 7.7 g/dL — AB (ref 13.0–18.0)

## 2014-08-23 LAB — HEPARIN LEVEL (UNFRACTIONATED): HEPARIN UNFRACTIONATED: 2.12 [IU]/mL — AB (ref 0.30–0.70)

## 2014-08-23 LAB — APTT: APTT: 40 s — AB (ref 24–36)

## 2014-08-23 MED ORDER — NOREPINEPHRINE BITARTRATE 1 MG/ML IV SOLN
0.0000 ug/min | INTRAVENOUS | Status: DC
  Administered 2014-08-23: 20 ug/min via INTRAVENOUS
  Administered 2014-08-24: 15 ug/min via INTRAVENOUS
  Administered 2014-08-25: 19 ug/min via INTRAVENOUS
  Filled 2014-08-23 (×3): qty 16

## 2014-08-23 MED ORDER — BISACODYL 10 MG RE SUPP
10.0000 mg | Freq: Once | RECTAL | Status: AC
  Administered 2014-08-23: 10 mg via RECTAL
  Filled 2014-08-23: qty 1

## 2014-08-23 MED ORDER — SODIUM CHLORIDE 0.9 % IV SOLN
1.0000 g | Freq: Once | INTRAVENOUS | Status: AC
  Administered 2014-08-23: 1 g via INTRAVENOUS
  Filled 2014-08-23: qty 10

## 2014-08-23 MED ORDER — AMIODARONE HCL IN DEXTROSE 360-4.14 MG/200ML-% IV SOLN
60.0000 mg/h | INTRAVENOUS | Status: AC
  Administered 2014-08-23 (×2): 60 mg/h via INTRAVENOUS
  Filled 2014-08-23 (×5): qty 200

## 2014-08-23 MED ORDER — METOCLOPRAMIDE HCL 5 MG/ML IJ SOLN
5.0000 mg | Freq: Three times a day (TID) | INTRAMUSCULAR | Status: DC
  Administered 2014-08-23 – 2014-08-25 (×6): 5 mg via INTRAVENOUS
  Filled 2014-08-23 (×6): qty 2

## 2014-08-23 MED ORDER — SODIUM CHLORIDE 0.9 % IV SOLN
500.0000 mg | Freq: Three times a day (TID) | INTRAVENOUS | Status: DC
  Administered 2014-08-23 – 2014-08-25 (×6): 500 mg via INTRAVENOUS
  Filled 2014-08-23 (×10): qty 500

## 2014-08-23 MED ORDER — HEPARIN SODIUM (PORCINE) 1000 UNIT/ML DIALYSIS
1000.0000 [IU] | INTRAMUSCULAR | Status: DC | PRN
  Filled 2014-08-23: qty 6

## 2014-08-23 MED ORDER — NOREPINEPHRINE BITARTRATE 1 MG/ML IV SOLN: 0.0000 ug/min | INTRAVENOUS | Status: DC

## 2014-08-23 MED ORDER — AMIODARONE LOAD VIA INFUSION
150.0000 mg | Freq: Once | INTRAVENOUS | Status: AC
  Administered 2014-08-23: 150 mg via INTRAVENOUS
  Filled 2014-08-23: qty 83.34

## 2014-08-23 MED ORDER — METHYLPREDNISOLONE SODIUM SUCC 40 MG IJ SOLR
20.0000 mg | Freq: Every day | INTRAMUSCULAR | Status: DC
  Administered 2014-08-24 – 2014-08-25 (×2): 20 mg via INTRAVENOUS
  Filled 2014-08-23 (×2): qty 1

## 2014-08-23 MED ORDER — HEPARIN BOLUS VIA INFUSION
3000.0000 [IU] | Freq: Once | INTRAVENOUS | Status: AC
  Administered 2014-08-23: 3000 [IU] via INTRAVENOUS
  Filled 2014-08-23: qty 3000

## 2014-08-23 MED ORDER — PUREFLOW DIALYSIS SOLUTION
INTRAVENOUS | Status: DC
  Administered 2014-08-23 – 2014-08-24 (×3): via INTRAVENOUS_CENTRAL

## 2014-08-23 MED ORDER — AMIODARONE HCL IN DEXTROSE 360-4.14 MG/200ML-% IV SOLN
30.0000 mg/h | INTRAVENOUS | Status: DC
  Administered 2014-08-24 (×2): 30 mg/h via INTRAVENOUS
  Filled 2014-08-23 (×9): qty 200

## 2014-08-23 MED ORDER — POTASSIUM & SODIUM PHOSPHATES 280-160-250 MG PO PACK
2.0000 | PACK | Freq: Two times a day (BID) | ORAL | Status: AC
  Administered 2014-08-23 (×2): 2 via ORAL
  Filled 2014-08-23 (×2): qty 2

## 2014-08-23 MED ORDER — SENNOSIDES-DOCUSATE SODIUM 8.6-50 MG PO TABS
2.0000 | ORAL_TABLET | Freq: Three times a day (TID) | ORAL | Status: DC
  Administered 2014-08-24 – 2014-08-25 (×4): 2 via ORAL
  Filled 2014-08-23 (×4): qty 2

## 2014-08-23 NOTE — Progress Notes (Addendum)
Paged Cardiologist on call (Dr. Saralyn Pilar) about patient sustaining heart rate in 120s-130s since 17:00 with afib rhythm with low blood pressure supported on levophed and heparin already being dosed by pharmacy. MD ordered amiodarone bolus and then drip to start at 33.3 mL/hr (60 mg/hr) and then go to 16.6 mL/hr (30 mg/hr) after six hours.

## 2014-08-23 NOTE — Progress Notes (Signed)
   08/22/14 1320  Clinical Encounter Type  Visited With Patient and family together  Visit Type Initial;Spiritual support  Consult/Referral To Chaplain  Spiritual Encounters  Spiritual Needs Emotional  Stress Factors  Family Stress Factors Health changes  Chaplain rounded in the unit and offered a compassionate presence and support as applicable. Chaplain Laquia Rosano A. Adamary Savary Ext. [phone]

## 2014-08-23 NOTE — Consult Note (Signed)
Family Discussion held this AM with Wife and Son, I have explained very grave prognosis and the family undertands  They have decided on DNR status, will proceed with CRRT and dialysis at this time.   Overall, patient is critically ill, prognosis is guarded.  Patient with Multiorgan failure and at high risk for cardiac arrest and death.    Corrin Parker, M.D.  [facility]  Medical Director [place] Director [facility] Cardio-Pulmonary Department

## 2014-08-23 NOTE — Procedures (Signed)
Central Venous Dailysis Catheter Placement: Indication: Hemo Dialysis/CRRT   Consent:verbal/written  Risks and benefits explained in detail including risk of infection, bleeding, respiratory failure and death..   Hand washing performed prior to starting the procedure.   Procedure: An active timeout was performed and correct patient, name, & ID confirmed.  After explaining risk and benefits, patient was positioned correctly for central venous access. Patient was prepped using strict sterile technique including chlorohexadine preps, sterile drape, sterile gown and sterile gloves.  The area was prepped, draped and anesthetized in the usual sterile manner. Patient comfort was obtained.  A triple lumen catheter was placed in RT femroal Vein There was good blood return, catheter caps were placed on lumens, catheter flushed easily, the line was secured and a sterile dressing and BIO-PATCH applied.   Ultrasound was used to visualize vasculature and guidance of needle.   Number of Attempts: 1 Complications:none  Estimated Blood Loss: none Chest Radiograph indicated and ordered.  Procedure Time: 14 mins Operator: Miranda Frese.   Corrin Parker, M.D.  [facility]  Medical Director [place] Director [facility] Cardio-Pulmonary Department

## 2014-08-23 NOTE — Progress Notes (Signed)
Ca 6.0, notified Dr. Marcille Blanco who said with albumin of 2.0 it was WDL.

## 2014-08-23 NOTE — Progress Notes (Signed)
Notified Dr. Holley Raring about how every 1 hour labs were not ordered on patient (before [facility] transition CRRT patients got labs every hour for usually 4-6 hours) just the every 4 hours renal function panels. MD ordered one one-time lab two hours after first lab.

## 2014-08-23 NOTE — Consult Note (Signed)
[facility] CONSULT NOTE    Date: 08/23/2014                  Patient Name:  Marcus Blevins  MRN: [id number]  DOB: 1928/01/04  Age / Sex: 81 y.o., male         PCP: Lelon Huh, MD                 Service Requesting Consult: Dr. Ether Griffins                 Reason for Consult: Acute renal failure            History of Present Illness: Patient is a 81 y.o. male with a PMHx of prostate cancer, coronary artery disease, hypertension, hyperlipidemia, history of bladder tumor resection, who was admitted to [facility] on 09/02/2014 for evaluation of fever, chills, and cough.  Patient is critically ill at the moment and unable to provide any history. History is obtained through chart review and discussion with the nurse. He was admitted on 08/15/2014 for reasons above. He has recently been started on chemotherapy for prostate cancer and was on Levaquin for sinus infection prior to admission. Upon presentation here his respiratory status declined and he was emergently intubated. Chest x-ray upon initial admission showed bilateral opacities. He remains critically ill at this moment and remains on the ventilator. We are asked to see him for acute renal failure. Upon presentation his creatinine was 0.99. Creatinine is now up to 2.89. He has significant hypotension and is on pressors. He has also developed acidosis with a serum bicarbonate down to 17. He also has associated hyponatremia. Urine output in the past 24 hours was found to be 325 cc. Therefore he has become oliguric. We had a discussion with the patient's wife regarding continuous renal replacement therapy today.   Medications: Outpatient medications: Prescriptions prior to admission  Medication Sig Dispense Refill Last Dose  . albuterol (PROVENTIL HFA;VENTOLIN HFA) 108 (90 BASE) MCG/ACT inhaler Inhale 2 puffs into the lungs every 6 (six) hours as needed for wheezing. 1 Inhaler 1 08/23/2014 at Unknown time  . amLODipine  (NORVASC) 5 MG tablet Take 5 mg by mouth daily.   08/28/2014 at Unknown time  . aspirin EC 81 MG tablet Take 81 mg by mouth daily.   08/29/2014 at Unknown time  . atenolol (TENORMIN) 50 MG tablet Take 50 mg by mouth daily.   08/18/2014 at 1030  . atorvastatin (LIPITOR) 20 MG tablet Take 20 mg by mouth at bedtime.   08/18/2014 at Unknown time  . Calcium Carbonate-Vitamin D (CALCIUM 600+D) 600-400 MG-UNIT per tablet Take 1 tablet by mouth daily.   08/22/2014 at Unknown time  . clotrimazole-betamethasone (LOTRISONE) cream Apply 1 application topically 2 (two) times daily.   08/31/2014 at Unknown time  . dexamethasone (DECADRON) 4 MG tablet Take 8 mg by mouth 2 (two) times daily. Pt only takes the day before and day after DOCEtaxel.   Past Week at Unknown time  . Diphenhyd-Hydrocort-Nystatin (FIRST-DUKES MOUTHWASH) SUSP Use as directed 30 mLs in the mouth or throat as needed (for mouth sores).   PRN at PRN  . diphenhydramine-acetaminophen (TYLENOL PM) 25-500 MG TABS Take 1 tablet by mouth at bedtime as needed (for sleep).   08/18/2014 at Unknown time  . fluticasone (FLONASE) 50 MCG/ACT nasal spray Place 1 spray into both nostrils daily.   09/04/2014 at Unknown time  . folic acid (FOLVITE) 562 MCG tablet Take 800 mcg  by mouth daily.   09/02/2014 at Unknown time  . leuprolide, 6 Month, (ELIGARD) 45 MG injection Inject 45 mg into the skin every 6 (six) months.   April at unknown   . Melatonin 5 MG TABS Take 1 tablet by mouth at bedtime as needed (for sleep).    08/18/2014 at Unknown time  . mometasone (ASMANEX) 220 MCG/INH inhaler Inhale 1 puff into the lungs 2 (two) times daily.   08/30/2014 at Unknown time  . montelukast (SINGULAIR) 10 MG tablet Take 10 mg by mouth at bedtime.   08/18/2014 at Unknown time  . prochlorperazine (COMPAZINE) 10 MG tablet Take 10 mg by mouth every 6 (six) hours as needed for nausea or vomiting.   08/21/2014 at Unknown time  . pyridOXINE (VITAMIN B-6) 100 MG tablet Take 100 mg by mouth  daily.   08/14/2014 at Unknown time  . solifenacin (VESICARE) 10 MG tablet Take 10 mg by mouth at bedtime.   08/18/2014 at Unknown time  . tamsulosin (FLOMAX) 0.4 MG CAPS capsule Take 0.4 mg by mouth daily.   08/11/2014 at Unknown time  . temazepam (RESTORIL) 7.5 MG capsule Take 7.5 mg by mouth at bedtime as needed for sleep.   08/18/2014 at Unknown time  . vitamin B-12 (CYANOCOBALAMIN) 1000 MCG tablet Take 1,000 mcg by mouth daily.   09/03/2014 at Unknown time  . hyoscyamine (LEVSIN SL) 0.125 MG SL tablet Place 1 tablet (0.125 mg total) under the tongue every 4 (four) hours as needed. (Patient not taking: Reported on 08/08/2014) 30 tablet 0     Current medications: Current Facility-Administered Medications  Medication Dose Route Frequency Provider Last Rate Last Dose  . acetaminophen (TYLENOL) tablet 650 mg  650 mg Oral Q6H PRN Lytle Butte, MD   650 mg at 08/22/14 0436   Or  . acetaminophen (TYLENOL) suppository 650 mg  650 mg Rectal Q6H PRN Lytle Butte, MD      . antiseptic oral rinse solution (CORINZ)  7 mL Mouth Rinse QID Theodoro Grist, MD   7 mL at 08/23/14 0400  . aspirin chewable tablet 81 mg  81 mg Oral Daily Charlett Nose, RPH   81 mg at 08/22/14 1113  . atorvastatin (LIPITOR) tablet 20 mg  20 mg Oral QHS Lytle Butte, MD   20 mg at 08/22/14 2126  . budesonide (PULMICORT) nebulizer solution 0.5 mg  0.5 mg Nebulization BID Lytle Butte, MD   0.5 mg at 08/23/14 0741  . chlorhexidine gluconate (PERIDEX) 0.12 % solution 15 mL  15 mL Mouth Rinse BID Theodoro Grist, MD   15 mL at 08/22/14 2200  . darifenacin (ENABLEX) 24 hr tablet 7.5 mg  7.5 mg Oral Daily Lytle Butte, MD   7.5 mg at 08/22/14 1112  . dexmedetomidine (PRECEDEX) 400 MCG/100ML (4 mcg/mL) infusion  0.4-1.2 mcg/kg/hr Intravenous Titrated Theodoro Grist, MD 6.5 mL/hr at 08/23/14 0754 0.4 mcg/kg/hr at 08/23/14 0754  . feeding supplement (VITAL AF 1.2 CAL) liquid 1,000 mL  1,000 mL Per Tube Continuous Flora Lipps, MD 57 mL/hr at  08/23/14 0812 1,000 mL at 08/23/14 0812  . fentaNYL (SUBLIMAZE) injection 50 mcg  50 mcg Intravenous Q2H PRN Vishal Mungal, MD      . fentaNYL 2590mcg in NS 261mL (34mcg/ml) infusion-PREMIX  10 mcg/hr Intravenous Continuous Theodoro Grist, MD 2.5 mL/hr at 08/23/14 0834 25 mcg/hr at 08/23/14 0834  . folic acid (FOLVITE) tablet 1 mg  1 mg Oral Daily Flora Lipps, MD  1 mg at 08/22/14 1112  . free water 25 mL  25 mL Per Tube 6 times per day Vishal Mungal, MD   25 mL at 08/23/14 0800  . heparin ADULT infusion 100 units/mL (25000 units/250 mL)  200 Units/hr Intravenous Continuous Theodoro Grist, MD 2 mL/hr at 08/22/14 2355 200 Units/hr at 08/22/14 2355  . imipenem-cilastatin (PRIMAXIN) 250 mg in sodium chloride 0.9 % 100 mL IVPB  250 mg Intravenous 3 times per day Charlett Nose, RPH   250 mg at 08/23/14 0517  . insulin regular (NOVOLIN R,HUMULIN R) 250 Units in sodium chloride 0.9 % 250 mL (1 Units/mL) infusion   Intravenous Continuous Theodoro Grist, MD 16.5 mL/hr at 08/23/14 0807    . ipratropium-albuterol (DUONEB) 0.5-2.5 (3) MG/3ML nebulizer solution 3 mL  3 mL Nebulization Q4H Lytle Butte, MD   3 mL at 08/23/14 0741  . methylPREDNISolone sodium succinate (SOLU-MEDROL) 40 mg/mL injection 40 mg  40 mg Intravenous Q12H Flora Lipps, MD   40 mg at 08/22/14 2126  . norepinephrine (LEVOPHED) 4mg  in D5W 276mL premix infusion  0-40 mcg/min Intravenous Titrated Theodoro Grist, MD 22.5 mL/hr at 08/23/14 0834 6 mcg/min at 08/23/14 0834  . ondansetron (ZOFRAN) tablet 4 mg  4 mg Oral Q6H PRN Lytle Butte, MD       Or  . ondansetron [facility]) injection 4 mg  4 mg Intravenous Q6H PRN Lytle Butte, MD      . senna-docusate (Senokot-S) tablet 2 tablet  2 tablet Oral BID Flora Lipps, MD   2 tablet at 08/22/14 2126  . sennosides (SENOKOT) 8.8 MG/5ML syrup 5 mL  5 mL Per Tube QHS PRN Vishal Mungal, MD      . sodium chloride 0.9 % injection 3 mL  3 mL Intravenous Q12H Lytle Butte, MD   3 mL at 08/22/14 2200  .  tamsulosin (FLOMAX) capsule 0.4 mg  0.4 mg Oral Daily Lytle Butte, MD   0.4 mg at 08/22/14 1113      Allergies: No Known Allergies    Past Medical History: Past Medical History  Diagnosis Date  . Cancer     prostate  . Heart disease   . Prostate cancer 2014    8 weeks of radiation, [facility]  . Hypertension   . Hyperlipidemia      Past Surgical History: Past Surgical History  Procedure Laterality Date  . Colonoscopy  2007  . Cholecystectomy  1958  . Bladder scraping  03-15-14    [facility], for bladder cancer  . Tumor removed from bladder       Family History: Family History  Problem Relation Age of Onset  . CVA Mother   . Heart attack Father      Social History: Social History   Social History  . Marital Status: Married    Spouse Name: N/A  . Number of Children: N/A  . Years of Education: N/A   Occupational History  . Not on file.   Social History Main Topics  . Smoking status: Former Smoker -- 1.00 packs/day for 20 years    Quit date: 01/07/1954  . Smokeless tobacco: Never Used  . Alcohol Use: No  . Drug Use: No  . Sexual Activity: Not on file   Other Topics Concern  . Not on file   Social History Narrative     Review of Systems: Patient unable to provide ROS as he's on the ventilator.  Vital Signs: Blood pressure 124/85, pulse 120,  temperature 97.6 F (36.4 C), temperature source Axillary, resp. rate 15, height 5\' 6"  (1.676 m), weight 78.7 kg (173 lb 8 oz), SpO2 100 %.  Weight trends: Filed Weights   08/21/14 0414 08/22/14 0400 08/23/14 0500  Weight: 72.2 kg (159 lb 2.8 oz) 77.4 kg (170 lb 10.2 oz) 78.7 kg (173 lb 8 oz)    Physical Exam: General: Critically ill appearing  Head: Normocephalic, atraumatic.  Eyes: Anicteric, pupils sluggish to react  Nose: Mucous membranes moist, not inflammed, nonerythematous.  Throat: Endotracheal tube noted to be in place  Neck: supple  Lungs:  Bilateral rhonchi, vent assisted  Heart: S1S2 tachycardic   Abdomen:  BS normoactive. Soft, Nondistended, non-tender.  No masses or organomegaly.  Extremities: Trace b/l LE edema  Neurologic: Arousable this AM, but not following commands  Skin: No visible rashes, scars.  GU:  Foley in place  Lab results: Basic Metabolic Panel:  Recent Labs Lab 08/20/14 0137 08/20/14 1037  08/21/14 1732 08/22/14 0426 08/23/14 0515  NA 136  --   < > 132* 130* 128*  K 3.9  --   < > 3.7 4.5 5.0  CL 108  --   < > 106 107 103  CO2 20*  --   < > 20* 20* 17*  GLUCOSE 167*  --   < > 214* 178* 313*  BUN 17  --   < > 21* 23* 37*  CREATININE 1.04  --   < > 1.71* 2.05* 2.89*  CALCIUM 6.9*  --   < > 6.1* 5.7* 6.0*  MG 1.5* 2.2  --   --  1.8  --   PHOS  --   --   --   --  2.1*  --   < > = values in this interval not displayed.  Liver Function Tests:  Recent Labs Lab 09/04/2014 2025 08/20/14 0137 08/20/14 1636 08/22/14 0426  AST 30 38 60*  --   ALT 14* 14* 17  --   ALKPHOS 118 99 97  --   BILITOT 0.4 <0.1* 0.4  --   PROT 5.8* 5.0* 5.4*  --   ALBUMIN 3.0* 2.6* 2.7* 2.0*   No results for input(s): LIPASE, AMYLASE in the last 168 hours. No results for input(s): AMMONIA in the last 168 hours.  CBC:  Recent Labs Lab 08/23/2014 2025 08/20/14 0137 08/21/14 0028 08/22/14 0426 08/22/14 1749 08/23/14 0515  WBC 32.9* 34.4* 21.8* 21.1* 25.0* 32.6*  NEUTROABS 32.6*  --   --   --   --   --   HGB 9.3* 8.6* 7.5* 7.7* 7.6* 8.7*  HCT 29.3* 27.6* 23.7* 24.2* 25.0* 28.0*  MCV 83.2 82.6 82.6 82.7 82.9 82.7  PLT 196 181 145* 129* 134* 231    Cardiac Enzymes:  Recent Labs Lab 09/02/2014 2025 08/20/14 0003 08/20/14 0510 08/20/14 1037  TROPONINI 0.08* 1.03* 4.15* 6.44*    BNP: Invalid input(s): POCBNP  CBG:  Recent Labs Lab 08/23/14 0348 08/23/14 0439 08/23/14 0606 08/23/14 0715 08/23/14 0802  GLUCAP 334* 332* 51* 255* 48*    Microbiology: Results for orders placed or performed during the hospital encounter of 09/01/2014  MRSA PCR Screening      Status: None   Collection Time: 08/18/2014  6:02 PM  Result Value Ref Range Status   MRSA by PCR NEGATIVE NEGATIVE Final    Comment:        The GeneXpert MRSA Assay (FDA approved for NASAL specimens only), is one component of a comprehensive  MRSA colonization surveillance program. It is not intended to diagnose MRSA infection nor to guide or monitor treatment for MRSA infections.   Blood Culture (routine x 2)     Status: None (Preliminary result)   Collection Time: 08/24/2014  7:15 PM  Result Value Ref Range Status   Specimen Description BLOOD RIGHT ARM  Final   Special Requests BOTTLES DRAWN AEROBIC AND ANAEROBIC 4CC  Final   Culture NO GROWTH 4 DAYS  Final   Report Status PENDING  Incomplete  Blood Culture (routine x 2)     Status: None (Preliminary result)   Collection Time: 08/26/2014  7:25 PM  Result Value Ref Range Status   Specimen Description BLOOD LEFT ASSIST CONTROL  Final   Special Requests BOTTLES DRAWN AEROBIC AND ANAEROBIC 4CC  Final   Culture NO GROWTH 4 DAYS  Final   Report Status PENDING  Incomplete  Urine culture     Status: None   Collection Time: 09/04/2014  8:25 PM  Result Value Ref Range Status   Specimen Description URINE, RANDOM  Final   Special Requests NONE  Final   Culture   Final    60,000 COLONIES/ml ESCHERICHIA COLI Results Called to: Janett Billow GREGORY AT [id number] 08/22/14 CTJ ESBL-EXTENDED SPECTRUM BETA LACTAMASE-THE ORGANISM IS RESISTANT TO PENICILLINS, CEPHALOSPORINS AND AZTREONAM ACCORDING TO CLSI M100-S15 [email or web address].    Report Status 08/22/2014 FINAL  Final   Organism ID, Bacteria ESCHERICHIA COLI  Final      Susceptibility   Escherichia coli - MIC*    AMPICILLIN >=32 RESISTANT Resistant     CEFTAZIDIME 4 RESISTANT Resistant     CEFAZOLIN >=64 RESISTANT Resistant     CEFTRIAXONE >=64 RESISTANT Resistant     CIPROFLOXACIN >=4 RESISTANT Resistant     GENTAMICIN <=1 SENSITIVE Sensitive     IMIPENEM <=0.25 SENSITIVE Sensitive      TRIMETH/SULFA <=20 SENSITIVE Sensitive     Extended ESBL POSITIVE Resistant     NITROFURANTOIN Value in next row Sensitive      SENSITIVE<=16    PIP/TAZO Value in next row Sensitive      SENSITIVE<=4    AMPICILLIN/SULBACTAM Value in next row Sensitive      SENSITIVE8    * 60,000 COLONIES/ml ESCHERICHIA COLI  Culture, expectorated sputum-assessment     Status: None   Collection Time: 08/20/14 10:05 AM  Result Value Ref Range Status   Specimen Description EXPECTORATED SPUTUM  Final   Special Requests Normal  Final   Sputum evaluation THIS SPECIMEN IS ACCEPTABLE FOR SPUTUM CULTURE  Final   Report Status 08/20/2014 FINAL  Final  Culture, respiratory (NON-Expectorated)     Status: None (Preliminary result)   Collection Time: 08/20/14 10:05 AM  Result Value Ref Range Status   Specimen Description EXPECTORATED SPUTUM  Final   Special Requests Normal Reflexed from [id number]  Final   Gram Stain   Final    MANY WBC SEEN NO ORGANISMS SEEN EXCELLENT SPECIMEN - 90-100% WBCS    Culture   Final    LIGHT GROWTH ESCHERICHIA COLI CRITICAL VALUE NOTED.  VALUE IS CONSISTENT WITH PREVIOUSLY REPORTED AND CALLED VALUE.    Report Status PENDING  Incomplete   Organism ID, Bacteria ESCHERICHIA COLI  Final      Susceptibility   Escherichia coli - MIC*    AMPICILLIN >=32 RESISTANT Resistant     CEFAZOLIN >=64 RESISTANT Resistant     CEFTRIAXONE >=64 RESISTANT Resistant     CIPROFLOXACIN >=4 RESISTANT Resistant  GENTAMICIN <=1 SENSITIVE Sensitive     IMIPENEM <=0.25 SENSITIVE Sensitive     NITROFURANTOIN <=16 SENSITIVE Sensitive     TRIMETH/SULFA <=20 SENSITIVE Sensitive     Extended ESBL POSITIVE Resistant     PIP/TAZO Value in next row Resistant      RESISTANT>=64    * LIGHT GROWTH ESCHERICHIA COLI    Coagulation Studies:  Recent Labs  08/21/14 1154  LABPROT 22.2*  INR 1.93    Urinalysis: No results for input(s): COLORURINE, LABSPEC, PHURINE, GLUCOSEU, HGBUR, BILIRUBINUR, KETONESUR,  PROTEINUR, UROBILINOGEN, NITRITE, LEUKOCYTESUR in the last 72 hours.  Invalid input(s): APPERANCEUR    Imaging: Dg Chest Port 1 View  08/23/2014   CLINICAL DATA:  Ventilated patient, respiratory failure.  EXAM: PORTABLE CHEST - 1 VIEW  COMPARISON:  Portable chest x-ray of August 21, 2014  FINDINGS: The lungs are reasonably well inflated. The left lower lobe remains dense and the hemidiaphragm is less well demonstrated today. Slightly increased density at the right lung base has progressed as well. The heart is top-normal in size. The pulmonary vascularity is not clearly engorged.  The endotracheal tube tip lies approximately 4 cm above the carina. The esophagogastric tube tip projects below the inferior margin of the image. The right PICC line tip projects over the proximal SVC.  IMPRESSION: There has been slight interval deterioration in the appearance of both lungs with increased pleural fluid layering posteriorly and some worsening of bibasilar atelectasis.   Electronically Signed   By: David  Martinique M.D.   On: 08/23/2014 07:33      Assessment & Plan: Pt is a 81 y.o. yo male with a PMHx of prostate cancer, coronary artery disease, hypertension, hyperlipidemia, history of bladder tumor resection, who was admitted to [facility] on 08/14/2014 for evaluation of fever, chills, and cough.  1.  Acute renal failure due to ATN from hypotension/sepsis/congestive heart failure.  The patient appears to be critically ill at this point in time. Dr. Mortimer Fries and I had a long discussion with the family. We explained the patient's current prognosis to the family. At this time they wish to proceed with all aggressive therapy including continuous renal replacement therapy. We will place temporary dialysis catheter for this purpose. Risks, benefits, and alternatives to these therapies were discussed. We will proceed with continuous renal replacement therapy at the bedside.  2. Acute respiratory failure. Pneumonia likely  contributing to this. The patient is on broad-spectrum anabiotic's and also on the ventilator. Alkaline phosphatase abuse currently being considered bipolar critical care.  3. Septic shock. Continue pressors to maintain a map of 65. Antibiotic selection per hospitalist and pulmonary critical care.  4. Hyponatremia. Serum sodium down to 128. Likely multifactorial. This should be corrected with continuous renal replacement therapy.  5. Metabolic acidosis. Serum bicarbonate currently down to 17. This will also be addressed by continuous renal replacement therapy.

## 2014-08-23 NOTE — Progress Notes (Signed)
[facility] [place] NAME: Marcus Blevins    MR#:  [id number]  DATE OF BIRTH:  1927/04/01  SUBJECTIVE:  CHIEF COMPLAINT:   Chief Complaint  Patient presents with  . Fever  . Chills  . Cough   patient is a 81 year old Caucasian male with past medical history significant for history of metastatic prostate cancer who is undergoing chemotherapy who presents to the hospital with fevers, chills and cough. He deteriorated. In emergency room and he was intubated. Per family members. Patient has been having cough for approximately 3 days which was nonproductive. Chest x-ray done in the emergency room revealed left lower lobe opacity, however, subsequent x-rays revealed congestive heart failure. Patient was managed on fluid restriction and pressors. Patient is on Levothroid at 8 mics present. His urine output is satisfactory and his repeat the chest x-ray today showed that right base medially infiltrate, so small pleural effusions but no pulmonary edema. His kidney function deteriorated and his creatinine is 1.9 today. The pressure remains marginal. Patient is status post PICC line placement yesterday on 08/20/2014. to  Right arm. No review of systems as patient is intubated. The patient remains in atrial fibrillation with rate of 90-100 Remains on 8 mics of Levothroid. Blood pressure is stable. Patient's oxygen needs increased significantly from 50% yesterday to 90% today with significant anasarca on IV fluids urinary output did not improve. Because of that. Patient is being initiated on CRRT by nephrology. Patient remains in intermittent atrial fibrillation at rate of 100. Nausea and vomiting earlier today.  Review of Systems  Unable to perform ROS: intubated    VITAL SIGNS: Blood pressure 87/53, pulse 102, temperature 97.1 F (36.2 C), temperature source Axillary, resp. rate 19, height 5\' 6"  (1.676 m), weight 79.2 kg (174 lb 9.7 oz), SpO2 96  %.  PHYSICAL EXAMINATION:   GENERAL:  81 y.o.-year-old patient lying in the bed in mild respiratory distress, he is intubated, sedated, on numerous medications including Precedex . Pupils are pinpoint. Cornell reflexes present EYES: Pupils equal, round, reactive to light and accommodation. No scleral icterus. Extraocular muscles intact.  HEENT: Head atraumatic, normocephalic. Oropharynx and nasopharynx clear. ET tube is in place as well as OG tube NECK:  Supple, no jugular venous distention. No thyroid enlargement, no tenderness.  LUNGS: Some diminished breath sounds bilaterally, no wheezing, numerous rales and  Crepitations noted bilaterally in both lung fields, more on the left. Breathing is calm. Patient is on mechanical ventilation with FiO2 of 90%.  CARDIOVASCULAR: S1, S2 normal. No murmurs, rubs, or gallops.  ABDOMEN: Soft, nontender, nondistended. Bowel sounds present. No organomegaly or mass.  EXTREMITIES: 2+ lower extremity and pedal edema, no cyanosis, or clubbing.  NEUROLOGIC: Cranial nerves II through XII not able to evaluate, but difficult to examine since patient is on the vent. Muscle strength not able to evaluate. Sensation not able to assess. Gait not checked.  PSYCHIATRIC: The patient is somnolent, sedated intermittently opens his eyes.  SKIN: No obvious rash, lesion, or ulcer. Diffuse anasarca  ORDERS/RESULTS REVIEWED:   CBC  Recent Labs Lab 08/24/2014 2025 08/20/14 0137 08/21/14 0028 08/22/14 0426 08/22/14 1749 08/23/14 0515  WBC 32.9* 34.4* 21.8* 21.1* 25.0* 32.6*  HGB 9.3* 8.6* 7.5* 7.7* 7.6* 8.7*  HCT 29.3* 27.6* 23.7* 24.2* 25.0* 28.0*  PLT 196 181 145* 129* 134* 231  MCV 83.2 82.6 82.6 82.7 82.9 82.7  MCH 26.4 25.8* 26.2 26.5 25.4* 25.8*  MCHC 31.8* 31.2* 31.7* 32.0  30.6* 31.2*  RDW 19.4* 19.2* 19.4* 19.0* 19.4* 19.2*  LYMPHSABS 0.3*  --   --   --   --   --   MONOABS 0.0*  --   --   --   --   --   EOSABS 0.0  --   --   --   --   --   BASOSABS 0.0  --   --    --   --   --    ------------------------------------------------------------------------------------------------------------------  Chemistries   Recent Labs Lab 09/06/2014 2025 08/20/14 0137 08/20/14 1037 08/20/14 1636 08/21/14 0905 08/21/14 1732 08/22/14 0426 08/23/14 0515 08/23/14 1007 08/23/14 1430  NA 135 136  --  136 134* 132* 130* 128*  --  132*  K 3.2* 3.9  --  4.4 3.5 3.7 4.5 5.0  --  4.5  CL 107 108  --  108 106 106 107 103  --  106  CO2 18* 20*  --  22 21* 20* 20* 17*  --  20*  GLUCOSE 135* 167*  --  137* 197* 214* 178* 313*  --  104*  BUN 18 17  --  19 24* 21* 23* 37*  --  42*  CREATININE 0.99 1.04  --  1.63* 1.93* 1.71* 2.05* 2.89*  --  2.59*  CALCIUM 7.5* 6.9*  --  6.5* 6.0* 6.1* 5.7* 6.0*  --  6.2*  MG  --  1.5* 2.2  --   --   --  1.8  --  2.1  --   AST 30 38  --  60*  --   --   --   --   --   --   ALT 14* 14*  --  17  --   --   --   --   --   --   ALKPHOS 118 99  --  97  --   --   --   --   --   --   BILITOT 0.4 <0.1*  --  0.4  --   --   --   --   --   --    ------------------------------------------------------------------------------------------------------------------ estimated creatinine clearance is 20.6 mL/min (by C-G formula based on Cr of 2.59). ------------------------------------------------------------------------------------------------------------------ No results for input(s): TSH, T4TOTAL, T3FREE, THYROIDAB in the last 72 hours.  Invalid input(s): FREET3  Cardiac Enzymes  Recent Labs Lab 08/20/14 0003 08/20/14 0510 08/20/14 1037  TROPONINI 1.03* 4.15* 6.44*   ------------------------------------------------------------------------------------------------------------------ Invalid input(s): POCBNP ---------------------------------------------------------------------------------------------------------------  RADIOLOGY: US Renal  08/23/2014   CLINICAL DATA:  Acute renal failure.  EXAM: RENAL / URINARY TRACT ULTRASOUND COMPLETE   COMPARISON:  Abdominal radiograph dated 08/20/2014.  FINDINGS: Right Kidney:  Length: 10.3 cm. Echogenicity within normal limits. There are 2 hypoechoic circumscribed masses within the right renal pelvis, the larger of which measures 1.0 by 1.0 by 1.0 cm. No hydronephrosis visualized.  Left Kidney:  Length: 10.2 cm. Echogenicity within normal limits. No mass or hydronephrosis visualized.  Bladder:  Urinary bladder is collapsed around a Foley catheter, and therefore not well visualized.  IMPRESSION: No evidence of hydronephrosis.  Preserved cortical thickness bilaterally.  2 hypoechoic circumscribed masses within the right renal pelvis, which may represent parapelvic cysts. Short-term sonographic follow-up, or cross-sectional imaging follow-up may be considered if found clinically necessary and when clinically feasible.   Electronically Signed   By: Fidela Salisbury M.D.   On: 08/23/2014 13:15   Dg Chest Port 1 View  08/23/2014   CLINICAL  DATA:  Ventilated patient, respiratory failure.  EXAM: PORTABLE CHEST - 1 VIEW  COMPARISON:  Portable chest x-ray of August 21, 2014  FINDINGS: The lungs are reasonably well inflated. The left lower lobe remains dense and the hemidiaphragm is less well demonstrated today. Slightly increased density at the right lung base has progressed as well. The heart is top-normal in size. The pulmonary vascularity is not clearly engorged.  The endotracheal tube tip lies approximately 4 cm above the carina. The esophagogastric tube tip projects below the inferior margin of the image. The right PICC line tip projects over the proximal SVC.  IMPRESSION: There has been slight interval deterioration in the appearance of both lungs with increased pleural fluid layering posteriorly and some worsening of bibasilar atelectasis.   Electronically Signed   By: David  Martinique M.D.   On: 08/23/2014 07:33   Dg Abd Portable 1v  08/23/2014   CLINICAL DATA:  Orogastric tube placement  EXAM: PORTABLE  ABDOMEN - 1 VIEW  COMPARISON:  August 20, 2014  FINDINGS: Orogastric tube tip and side port are in the stomach. Visualized bowel gas pattern unremarkable. There is atelectatic change in the left lung base. There are surgical clips in the upper right abdomen.  IMPRESSION: Tube tip and side port in stomach. Visualized bowel gas pattern unremarkable.   Electronically Signed   By: Lowella Grip III M.D.   On: 08/23/2014 12:19    EKG:  Orders placed or performed during the hospital encounter of 08/17/2014  . ED EKG 12-Lead  . ED EKG 12-Lead  . EKG 12-Lead  . EKG 12-Lead  . EKG 12-Lead  . EKG 12-Lead  . EKG 12-Lead  . EKG 12-Lead    ASSESSMENT AND PLAN:  Active Problems:   Severe sepsis   Acute respiratory failure with hypoxia   A-fib   Acute renal failure (ARF)   Acute respiratory failure 1. Severe sepsis due to left lower lobe and possible right lower lobe pneumonia due to ESBL Escherichia coli, discontinue vancomycin , Zosyn , Zithromax , continue imipenem ,  Blood cultures are negative 2. Left lower and right lower lobe pneumonia due to ESBL Escherichia coli, now on imipenem . Follow clinically unfortunate patient is on a 90% FiO2 at present, the due to significant fluid retention due to acute renal failure.  3. Acute pulmonary edema due to acute systolic congestive heart failure 2/2  fluid overload during resuscitation, Echocardiogram showed ejection fraction of 30-35%, global hypokinesis. Of IV fluids, now on CRRT  4. Septic shock. Continue levofed keeping map is around 65 and above, remains on same dose  as yesterday, weaning it off as tolerated 5. Non-Q-wave MI, type II, likely due to demand ischemia. Echocardiogram reveals ejection fraction of 30-35%. Cardiology consultation is appreciated. Unable to use metoprolol or nitroglycerin due to hypotension, septic shock. Patient to be continued on a heparin IV drip.  Eliquis was discontinued  6. Acute respiratory failure due to pneumonia  as well as acute pulmonary edema. Continue mechanical ventilation, now on 90% FiO2. Appreciate pulmonary input. Now patient is being initiated on CRRT. Follow clinically tomorrow morning 7. Leukocytosis. Improved with antibiotic therapy 8. Lactic metabolic acidosis likely due to poor peripheral perfusion. Status post PICC line lay cement 13th of August 2016. Of IV fluids now.  9. Atrial fibrillation, atrial flutter. Now on heparin IV, questionable amiodarone to better control heart rate is as it remains in the 100s, discussed is Dr. Ubaldo Glassing yesterday, however, recommended not to initiate at  present because it was felt to be sepsis related 10. Acute renal failure, unfortunately worsened with IV fluids now on CRRT due to recurrent acute systolic CHF, acute pulmonary edema.  11. Nausea and vomiting. . Initiate Reglan at low-dose  Management plans discussed with nursing staff.   DRUG ALLERGIES: No Known Allergies  CODE STATUS:     Code Status Orders        Start     Ordered   09/01/2014 2127  Full code   Continuous     08/30/2014 2127    Advance Directive Documentation        Most Recent Value   Type of Advance Directive  Healthcare Power of Attorney, Living will   Pre-existing out of facility DNR order (yellow form or pink MOST form)     "MOST" Form in Place?        TOTAL CRITICAL CARE TIME TAKING CARE OF THIS PATIENT: 45 minutes.  Discussed with nursing staff  Shaquasia Caponigro M.D on 08/23/2014 at 3:19 PM  Between 7am to 6pm - Pager - [phone]  After 6pm go to [email or web address] - password [email or web address] [facility]  Office  [phone]  CC: Primary care physician; Lelon Huh, MD

## 2014-08-23 NOTE — Care Management Important Message (Signed)
Important Message  Patient Details  Name: Marcus Blevins MRN: [id number] Date of Birth: [birth year]   Medicare Important Message Given:  Yes-second notification given    Marshell Garfinkel, RN 08/23/2014, 11:26 AM

## 2014-08-23 NOTE — Progress Notes (Signed)
ANTICOAGULATION CONSULT NOTE - Follow Up Consult  Pharmacy Consult for Heparin Indication: chest pain/ACS  No Known Allergies  Patient Measurements: Height: 5\' 6"  (167.6 cm) Weight: 174 lb 9.7 oz (79.2 kg) IBW/kg (Calculated) : 63.8 Heparin Dosing Weight: 72.2 kg  Vital Signs: Temp: 97.1 F (36.2 C) (08/16 1200) Temp Source: Axillary (08/16 1200) BP: 87/53 mmHg (08/16 1500) Pulse Rate: 102 (08/16 1500)  Labs:  Recent Labs  08/21/14 1154  08/21/14 2050 08/22/14 0426 08/22/14 0953 08/22/14 1749 08/23/14 0515 08/23/14 1007 08/23/14 1430  HGB  --   --   --  7.7*  --  7.6* 8.7*  --   --   HCT  --   --   --  24.2*  --  25.0* 28.0*  --   --   PLT  --   --   --  129*  --  134* 231  --   --   APTT 51*  --   --   --  >200* >200*  --  40*  --   LABPROT 22.2*  --   --   --   --   --   --   --   --   INR 1.93  --   --   --   --   --   --   --   --   HEPARINUNFRC  --   --  2.00*  --  >3.60*  --   --   --   --   CREATININE  --   < >  --  2.05*  --   --  2.89*  --  2.59*  < > = values in this interval not displayed.  Estimated Creatinine Clearance: 20.6 mL/min (by C-G formula based on Cr of 2.59).   Medications:  Scheduled:  . antiseptic oral rinse  7 mL Mouth Rinse QID  . aspirin  81 mg Oral Daily  . atorvastatin  20 mg Oral QHS  . budesonide  0.5 mg Nebulization BID  . calcium gluconate  1 g Intravenous Once  . chlorhexidine gluconate  15 mL Mouth Rinse BID  . folic acid  1 mg Oral Daily  . free water  25 mL Per Tube 6 times per day  . imipenem-cilastatin  500 mg Intravenous 3 times per day  . ipratropium-albuterol  3 mL Nebulization Q4H  . [START ON 08/24/2014] methylPREDNISolone (SOLU-MEDROL) injection  20 mg Intravenous Daily  . potassium & sodium phosphates  2 packet Oral BID  . senna-docusate  2 tablet Oral BID  . sodium chloride  3 mL Intravenous Q12H   Infusions:  . dexmedetomidine 0.6 mcg/kg/hr (08/23/14 0902)  . feeding supplement (VITAL AF 1.2 CAL) 1,000 mL  (08/23/14 1313)  . fentaNYL infusion INTRAVENOUS 300 mcg/hr (08/23/14 1436)  . heparin 400 Units/hr (08/23/14 1423)  . insulin (NOVOLIN-R) infusion 4.2 mL/hr at 08/23/14 1508  . norepinephrine 6 mcg/min (08/23/14 1500)  . pureflow 2,500 mL/hr at 08/23/14 1245   PRN: acetaminophen **OR** acetaminophen, fentaNYL (SUBLIMAZE) injection, heparin, ondansetron **OR** ondansetron (ZOFRAN) IV, sennosides  Assessment: Pharmacy consulted to dose heparin for 81 yo male being treated for possible ACS/NSTEMI. Patient previously on apixaban 5mg  BID with last dose being administered with am meds on 8/14. Heparin drip is running at 200 units/hr.   Goal of Therapy:  Heparin level 0.3-0.7 units/ml, aPTT 68-109 Monitor platelets by anticoagulation protocol: Yes   Plan:   Will order 3000 units bolus from the bag x 1.  Will increase heparin to 400 units/hr. Will recheck aPTT in 8 hours. Will order anti-Xa level with am labs. Will continue to monitor via aPTT until anti-Xa levels and aPTT correlate.  Pharmacy will continue to monitor and adjust per consult.    [facility],Michael L 08/23/2014,3:30 PM

## 2014-08-23 NOTE — Progress Notes (Signed)
Notified Dr. Mortimer Fries on the phone about patient coughing up what appears to be tube feedings (thin, tan, secretions). Orogastric tube has migrated out 2 cm from reported 65 cm at lip. Tube feedings currently on hold. MD ordered RN to advance tube and get abdominal x-ray. RN advanced tubing to 70 cm at the lip, x-ray ordered.

## 2014-08-23 NOTE — Progress Notes (Signed)
Paged MD on call Dr. Jannifer Franklin about patient's bowel movement of partially red mixed with brown coupled with increased levophed requirements while on heparin drip (and that blood pressure had increased in the last thirty minutes or so). Also that patient's temperature had been low despite maxing out fluid warmer on CRRT, warm blankets applied to patient, and increasing room temperature.

## 2014-08-23 NOTE — Consult Note (Addendum)
[facility] NOTE  Pharmacy Consult for electrolytes Indication:    No Known Allergies  Patient Measurements: Height: 5\' 6"  (167.6 cm) Weight: 174 lb 9.7 oz (79.2 kg) IBW/kg (Calculated) : 63.8 Adjusted Body Weight:   Vital Signs: Temp: 94.8 F (34.9 C) (08/16 1832) Temp Source: Axillary (08/16 1832) BP: 55/44 mmHg (08/16 1925) Pulse Rate: 73 (08/16 1928) Intake/Output from previous day: 08/15 0701 - 08/16 0700 In: 3638.2 [I.V.:2131.3; NG/GT:1207; IV Piggyback:300] Out: 325 [Urine:325] Intake/Output from this shift: Total I/O In: 3.7 [I.V.:3.7] Out: -  Vent settings for last 24 hours: Vent Mode:  [-] PRVC FiO2 (%):  [60 %-100 %] 100 % Set Rate:  [20 bmp] 20 bmp Vt Set:  [530 mL] 530 mL PEEP:  [5 cmH20] 5 cmH20  Labs:  Recent Labs  08/21/14 1154  08/22/14 0426 08/22/14 0953 08/22/14 1749 08/23/14 0515 08/23/14 1007 08/23/14 1430 08/23/14 1847  WBC  --   --  21.1*  --  25.0* 32.6*  --   --   --   HGB  --   --  7.7*  --  7.6* 8.7*  --   --  7.7*  HCT  --   --  24.2*  --  25.0* 28.0*  --   --   --   PLT  --   --  129*  --  134* 231  --   --   --   APTT 51*  --   --  >200* >200*  --  40*  --   --   INR 1.93  --   --   --   --   --   --   --   --   CREATININE  --   < > 2.05*  --   --  2.89*  --  2.59*  --   MG  --   --  1.8  --   --   --  2.1 2.1  --   PHOS  --   --  2.1*  --   --   --   --  4.0  --   ALBUMIN  --   --  2.0*  --   --   --   --  2.0*  --   < > = values in this interval not displayed. Estimated Creatinine Clearance: 20.6 mL/min (by C-G formula based on Cr of 2.59).   Recent Labs  08/23/14 1651 08/23/14 1757 08/23/14 1846  GLUCAP 122* 135* 152*    Microbiology: Recent Results (from the past 720 hour(s))  MRSA PCR Screening     Status: None   Collection Time: 09/03/2014  6:02 PM  Result Value Ref Range Status   MRSA by PCR NEGATIVE NEGATIVE Final    Comment:        The GeneXpert MRSA Assay (FDA approved for NASAL  specimens only), is one component of a comprehensive MRSA colonization surveillance program. It is not intended to diagnose MRSA infection nor to guide or monitor treatment for MRSA infections.   Blood Culture (routine x 2)     Status: None (Preliminary result)   Collection Time: 08/16/2014  7:15 PM  Result Value Ref Range Status   Specimen Description BLOOD RIGHT ARM  Final   Special Requests BOTTLES DRAWN AEROBIC AND ANAEROBIC 4CC  Final   Culture NO GROWTH 4 DAYS  Final   Report Status PENDING  Incomplete  Blood Culture (routine x 2)     Status:  None (Preliminary result)   Collection Time: 08/11/2014  7:25 PM  Result Value Ref Range Status   Specimen Description BLOOD LEFT ASSIST CONTROL  Final   Special Requests BOTTLES DRAWN AEROBIC AND ANAEROBIC 4CC  Final   Culture NO GROWTH 4 DAYS  Final   Report Status PENDING  Incomplete  Urine culture     Status: None   Collection Time: 08/13/2014  8:25 PM  Result Value Ref Range Status   Specimen Description URINE, RANDOM  Final   Special Requests NONE  Final   Culture   Final    60,000 COLONIES/ml ESCHERICHIA COLI Results Called to: Janett Billow GREGORY AT [id number] 08/22/14 [facility] ESBL-EXTENDED SPECTRUM BETA LACTAMASE-THE ORGANISM IS RESISTANT TO PENICILLINS, CEPHALOSPORINS AND AZTREONAM ACCORDING TO CLSI M100-S15 VOL.[place].    Report Status 08/22/2014 FINAL  Final   Organism ID, Bacteria ESCHERICHIA COLI  Final      Susceptibility   Escherichia coli - MIC*    AMPICILLIN >=32 RESISTANT Resistant     CEFTAZIDIME 4 RESISTANT Resistant     CEFAZOLIN >=64 RESISTANT Resistant     CEFTRIAXONE >=64 RESISTANT Resistant     CIPROFLOXACIN >=4 RESISTANT Resistant     GENTAMICIN <=1 SENSITIVE Sensitive     IMIPENEM <=0.25 SENSITIVE Sensitive     TRIMETH/SULFA <=20 SENSITIVE Sensitive     Extended ESBL POSITIVE Resistant     NITROFURANTOIN Value in next row Sensitive      SENSITIVE<=16    PIP/TAZO Value in next row Sensitive       SENSITIVE<=4    AMPICILLIN/SULBACTAM Value in next row Sensitive      SENSITIVE8    * 60,000 COLONIES/ml ESCHERICHIA COLI  Culture, expectorated sputum-assessment     Status: None   Collection Time: 08/20/14 10:05 AM  Result Value Ref Range Status   Specimen Description EXPECTORATED SPUTUM  Final   Special Requests Normal  Final   Sputum evaluation THIS SPECIMEN IS ACCEPTABLE FOR SPUTUM CULTURE  Final   Report Status 08/20/2014 FINAL  Final  Culture, respiratory (NON-Expectorated)     Status: None   Collection Time: 08/20/14 10:05 AM  Result Value Ref Range Status   Specimen Description EXPECTORATED SPUTUM  Final   Special Requests Normal Reflexed from [id number]  Final   Gram Stain   Final    MANY WBC SEEN NO ORGANISMS SEEN EXCELLENT SPECIMEN - 90-100% WBCS    Culture   Final    LIGHT GROWTH ESCHERICHIA COLI ESBL-EXTENDED SPECTRUM BETA LACTAMASE-THE ORGANISM IS RESISTANT TO PENICILLINS, CEPHALOSPORINS AND AZTREONAM ACCORDING TO CLSI M100-S15 VOL.[place]. ESBL ECOLI ALREADY REPORTED IN URINE CULTURE 08/22/14 [facility]    Report Status 08/23/2014 FINAL  Final   Organism ID, Bacteria ESCHERICHIA COLI  Final      Susceptibility   Escherichia coli - MIC*    AMPICILLIN >=32 RESISTANT Resistant     CEFAZOLIN >=64 RESISTANT Resistant     CEFTRIAXONE >=64 RESISTANT Resistant     CIPROFLOXACIN >=4 RESISTANT Resistant     GENTAMICIN <=1 SENSITIVE Sensitive     IMIPENEM <=0.25 SENSITIVE Sensitive     NITROFURANTOIN <=16 SENSITIVE Sensitive     TRIMETH/SULFA <=20 SENSITIVE Sensitive     Extended ESBL POSITIVE Resistant     PIP/TAZO Value in next row Resistant      RESISTANT>=64    * LIGHT GROWTH ESCHERICHIA COLI    Medications:  Scheduled:  . antiseptic oral rinse  7 mL Mouth Rinse QID  .  aspirin  81 mg Oral Daily  . atorvastatin  20 mg Oral QHS  . budesonide  0.5 mg Nebulization BID  . calcium gluconate  1 g Intravenous Once  . chlorhexidine gluconate  15 mL Mouth Rinse BID  .  folic acid  1 mg Oral Daily  . free water  25 mL Per Tube 6 times per day  . imipenem-cilastatin  500 mg Intravenous 3 times per day  . ipratropium-albuterol  3 mL Nebulization Q4H  . [START ON 08/24/2014] methylPREDNISolone (SOLU-MEDROL) injection  20 mg Intravenous Daily  . metoCLOPramide (REGLAN) injection  5 mg Intravenous 3 times per day  . senna-docusate  2 tablet Oral TID  . sodium chloride  3 mL Intravenous Q12H   Infusions:  . amiodarone 60 mg/hr (08/23/14 1745)   Followed by  . amiodarone    . dexmedetomidine 0.7 mcg/kg/hr (08/23/14 1728)  . feeding supplement (VITAL AF 1.2 CAL) 1,000 mL (08/23/14 1313)  . fentaNYL infusion INTRAVENOUS 250 mcg/hr (08/23/14 2029)  . heparin 400 Units/hr (08/23/14 1423)  . insulin (NOVOLIN-R) infusion 3.9 mL/hr at 08/23/14 1905  . norepinephrine (LEVOPHED) Adult infusion    . pureflow 2,500 mL/hr at 08/23/14 6295    Assessment: Pt is a 81 year old male. Pharmacy was consulted for electrolyte management. Corrected Ca+ was 8. K=4.5 and Mg =2.1  Goal of Therapy:  Correctly of electrolytes  Plan:  Give 1g calcium gluconate now.  Follow up with AM labs  Sharni Negron D Gurshan Settlemire 08/23/2014,8:30 PM

## 2014-08-23 NOTE — Progress Notes (Signed)
Nutrition Follow-up    INTERVENTION:   EN: TF on hold pending abdominal xray; if OG placement correct and xray negative, may benefit from promotility agent such as reglan Coordination of Care: discussed hypocalcemia and hypophosphatemia during ICU rounds with MD, starting on CRRT, noted 3 Ca+ bath, plan for supplementation of phosphorus  NUTRITION DIAGNOSIS:   Inadequate oral intake related to acute illness as evidenced by NPO status.   GOAL:   Provide needs based on ASPEN/SCCM guidelines  MONITOR:    (Energy Intake, EN, Digestive System, Anthropometrics, Electrolyte/Renal profile, Glucose Profile)   ASSESSMENT:    Pt remains on vent, dialysis catheter placed this AM and starting CRRT   Diet Order:  Diet NPO time specified Except for: Sips with Meds   EN: TF on hold due to TF in oral cavity per MD  Skin:  Reviewed, no issues  Last BM:   no BM  Digestive System: abdomen distended but soft, on constipation prevention protocol, abdominal xray pending  Electrolyte and Renal Profile:  Recent Labs Lab 08/20/14 0137 08/20/14 1037  08/21/14 1732 08/22/14 0426 08/23/14 0515  BUN 17  --   < > 21* 23* 37*  CREATININE 1.04  --   < > 1.71* 2.05* 2.89*  NA 136  --   < > 132* 130* 128*  K 3.9  --   < > 3.7 4.5 5.0  MG 1.5* 2.2  --   --  1.8  --   PHOS  --   --   --   --  2.1*  --   < > = values in this interval not displayed.  Corrected calcium 7.6 with albumin 2.0  Glucose Profile:  Recent Labs  08/23/14 0606 08/23/14 0715 08/23/14 0802  GLUCAP 286* 255* 266*   Protein Profile:  Recent Labs Lab 08/20/14 0137 08/20/14 1636 08/22/14 0426  ALBUMIN 2.6* 2.7* 2.0*   Nutritional Anemia Profile:  CBC Latest Ref Rng 08/23/2014 08/22/2014 08/22/2014  WBC 3.8 - 10.6 K/uL 32.6(H) 25.0(H) 21.1(H)  Hemoglobin 13.0 - 18.0 g/dL 8.7(L) 7.6(L) 7.7(L)  Hematocrit 40.0 - 52.0 % 28.0(L) 25.0(L) 24.2(L)  Platelets 150 - 440 K/uL 231 134(L) 129(L)    Meds: insulin drip,  levophed, solumedrol, precedex, fentanyl  Height:   Ht Readings from Last 1 Encounters:  08/20/14 5\' 6"  (1.676 m)    Weight:   Wt Readings from Last 1 Encounters:  08/23/14 173 lb 8 oz (78.7 kg)   Filed Weights   08/21/14 0414 08/22/14 0400 08/23/14 0500  Weight: 159 lb 2.8 oz (72.2 kg) 170 lb 10.2 oz (77.4 kg) 173 lb 8 oz (78.7 kg)    BMI:  Body mass index is 28.02 kg/(m^2).  Estimated Nutritional Needs:   Kcal:  1620 kcals (Ve: 9, Tmax: 37.8) using current wt of 65.3 kg  Protein:  98-130 g (1.5-2.0 g/kg)   Fluid:  1625-1950 mL (25-30  ml/kg)   HIGH Care Level  Kerman Passey MS, RD, LDN [phone] Pager

## 2014-08-23 NOTE — Progress Notes (Signed)
PULMONARY / CRITICAL CARE MEDICINE   Name: Marcus Blevins MRN: [id number] DOB: [birth year]    ADMISSION DATE:  09/01/2014 CONSULTATION DATE: 08/20/14  REFERRING MD : Dr. Theodoro Grist Primary Pulmonary - [facility]   CHIEF COMPLAINT:   Follow up  Fever, chills and cough resp failure    SIGNIFICANT EVENTS   Patient intubated and sedated,  fio2 increased to 90%, CXR is worsening, Kidney function worsening minimal UO, will need dialysis patient remains critically ill, EF 30%      PAST MEDICAL HISTORY    :  Past Medical History  Diagnosis Date  . Cancer     prostate  . Heart disease   . Prostate cancer 2014    8 weeks of radiation, [facility]  . Hypertension   . Hyperlipidemia    Past Surgical History  Procedure Laterality Date  . Colonoscopy  2007  . Cholecystectomy  1958  . Bladder scraping  03-15-14    [facility], for bladder cancer  . Tumor removed from bladder     Prior to Admission medications   Medication Sig Start Date End Date Taking? Authorizing Provider  albuterol (PROVENTIL HFA;VENTOLIN HFA) 108 (90 BASE) MCG/ACT inhaler Inhale 2 puffs into the lungs every 6 (six) hours as needed for wheezing. 08/03/14  Yes Birdie Sons, MD  amLODipine (NORVASC) 5 MG tablet Take 5 mg by mouth daily.   Yes Historical Provider, MD  aspirin EC 81 MG tablet Take 81 mg by mouth daily.   Yes Historical Provider, MD  atenolol (TENORMIN) 50 MG tablet Take 50 mg by mouth daily.   Yes Historical Provider, MD  atorvastatin (LIPITOR) 20 MG tablet Take 20 mg by mouth at bedtime.   Yes Historical Provider, MD  Calcium Carbonate-Vitamin D (CALCIUM 600+D) 600-400 MG-UNIT per tablet Take 1 tablet by mouth daily.   Yes Historical Provider, MD  clotrimazole-betamethasone (LOTRISONE) cream Apply 1 application topically 2 (two) times daily.   Yes Historical Provider, MD  dexamethasone (DECADRON) 4 MG tablet Take 8 mg by mouth 2 (two) times daily. Pt only takes the day before and day after  DOCEtaxel.   Yes Historical Provider, MD  Diphenhyd-Hydrocort-Nystatin (FIRST-DUKES MOUTHWASH) SUSP Use as directed 30 mLs in the mouth or throat as needed (for mouth sores).   Yes Historical Provider, MD  diphenhydramine-acetaminophen (TYLENOL PM) 25-500 MG TABS Take 1 tablet by mouth at bedtime as needed (for sleep).   Yes Historical Provider, MD  fluticasone (FLONASE) 50 MCG/ACT nasal spray Place 1 spray into both nostrils daily.   Yes Historical Provider, MD  folic acid (FOLVITE) 063 MCG tablet Take 800 mcg by mouth daily.   Yes Historical Provider, MD  leuprolide, 6 Month, (ELIGARD) 45 MG injection Inject 45 mg into the skin every 6 (six) months.   Yes Historical Provider, MD  Melatonin 5 MG TABS Take 1 tablet by mouth at bedtime as needed (for sleep).    Yes Historical Provider, MD  mometasone (ASMANEX) 220 MCG/INH inhaler Inhale 1 puff into the lungs 2 (two) times daily.   Yes Historical Provider, MD  montelukast (SINGULAIR) 10 MG tablet Take 10 mg by mouth at bedtime.   Yes Historical Provider, MD  prochlorperazine (COMPAZINE) 10 MG tablet Take 10 mg by mouth every 6 (six) hours as needed for nausea or vomiting.   Yes Historical Provider, MD  pyridOXINE (VITAMIN B-6) 100 MG tablet Take 100 mg by mouth daily.   Yes Historical Provider, MD  solifenacin (VESICARE) 10 MG tablet  Take 10 mg by mouth at bedtime.   Yes Historical Provider, MD  tamsulosin (FLOMAX) 0.4 MG CAPS capsule Take 0.4 mg by mouth daily.   Yes Historical Provider, MD  temazepam (RESTORIL) 7.5 MG capsule Take 7.5 mg by mouth at bedtime as needed for sleep.   Yes Historical Provider, MD  vitamin B-12 (CYANOCOBALAMIN) 1000 MCG tablet Take 1,000 mcg by mouth daily.   Yes Historical Provider, MD  hyoscyamine (LEVSIN SL) 0.125 MG SL tablet Place 1 tablet (0.125 mg total) under the tongue every 4 (four) hours as needed. Patient not taking: Reported on 08/18/2014 07/20/14   Birdie Sons, MD   No Known Allergies   FAMILY HISTORY    Family History  Problem Relation Age of Onset  . CVA Mother   . Heart attack Father       SOCIAL HISTORY    reports that he quit smoking about 60 years ago. He has never used smokeless tobacco. He reports that he does not drink alcohol or use illicit drugs.  Review of Systems  Unable to perform ROS: critical illness  - unable to obtain - patient intubated and sedated    VITAL SIGNS    Temp:  [97.6 F (36.4 C)-98.5 F (36.9 C)] 97.6 F (36.4 C) (08/16 0800) Pulse Rate:  [86-120] 120 (08/16 0800) Resp:  [15-23] 15 (08/16 0800) BP: (81-138)/(49-85) 124/85 mmHg (08/16 0800) SpO2:  [86 %-100 %] 100 % (08/16 0800) FiO2 (%):  [50 %-100 %] 90 % (08/16 0756) Weight:  [173 lb 8 oz (78.7 kg)] 173 lb 8 oz (78.7 kg) (08/16 0500) HEMODYNAMICS:   VENTILATOR SETTINGS: Vent Mode:  [-] PRVC FiO2 (%):  [50 %-100 %] 90 % Set Rate:  [20 bmp] 20 bmp Vt Set:  [530 mL] 530 mL PEEP:  [5 cmH20] 5 cmH20 INTAKE / OUTPUT:  Intake/Output Summary (Last 24 hours) at 08/23/14 0258 Last data filed at 08/23/14 5277  Gross per 24 hour  Intake 3612.76 ml  Output    360 ml  Net 3252.76 ml       PHYSICAL EXAM   Physical Exam GENERAL:sedated on the MV, no acute on the vent.  HEAD: Normocephalic, atraumatic.  EYES: Pupils equal, round, reactive to light. Unable to assess extraocular muscles given mental status/medical condition. No scleral icterus.  MOUTH: Moist mucosal membrane. Dentition intact. No abscess noted.  EAR, NOSE, THROAT: Clear without exudates. No external lesions.  NECK: Supple. No thyromegaly. No nodules. No JVD.  PULMONARY: Coarse breath sounds with scattered rhonchi right lung field, intubated/mechanical ventilation CARDIOVASCULAR: S1 and S2. Tachycardic No murmurs, rubs, or gallops. No edema. Pedal pulses 2+ bilaterally.  GASTROINTESTINAL: Soft, nontender, nondistended. No masses. Positive bowel sounds. No hepatosplenomegaly.  MUSCULOSKELETAL: No swelling,  clubbing, or edema. Range of motion full in all extremities.  NEUROLOGIC: Unable to assess given mental status/medical condition GCs<8T SKIN: No ulceration, lesions, rashes, or cyanosis. Skin warm and dry. Turgor intact.     LABS   LABS:  CBC  Recent Labs Lab 08/22/14 0426 08/22/14 1749 08/23/14 0515  WBC 21.1* 25.0* 32.6*  HGB 7.7* 7.6* 8.7*  HCT 24.2* 25.0* 28.0*  PLT 129* 134* 231   Coag's  Recent Labs Lab 08/20/14 0137 08/21/14 1154 08/22/14 0953 08/22/14 1749  APTT 36 51* >200* >200*  INR 1.20 1.93  --   --    BMET  Recent Labs Lab 08/21/14 1732 08/22/14 0426 08/23/14 0515  NA 132* 130* 128*  K 3.7 4.5 5.0  CL 106 107 103  CO2 20* 20* 17*  BUN 21* 23* 37*  CREATININE 1.71* 2.05* 2.89*  GLUCOSE 214* 178* 313*   Electrolytes  Recent Labs Lab 08/20/14 0137 08/20/14 1037  08/21/14 1732 08/22/14 0426 08/23/14 0515  CALCIUM 6.9*  --   < > 6.1* 5.7* 6.0*  MG 1.5* 2.2  --   --  1.8  --   PHOS  --   --   --   --  2.1*  --   < > = values in this interval not displayed. Sepsis Markers  Recent Labs Lab 08/20/14 0003 08/20/14 1534 08/21/14 0028  LATICACIDVEN 2.2* 1.5 1.6   ABG  Recent Labs Lab 08/20/14 1735 08/20/14 1917 08/21/14 0505  PHART 7.19* 7.23* 7.30*  PCO2ART 51* 45 37  PO2ART 76* 63* 126*   Liver Enzymes  Recent Labs Lab 08/12/2014 2025 08/20/14 0137 08/20/14 1636 08/22/14 0426  AST 30 38 60*  --   ALT 14* 14* 17  --   ALKPHOS 118 99 97  --   BILITOT 0.4 <0.1* 0.4  --   ALBUMIN 3.0* 2.6* 2.7* 2.0*   Cardiac Enzymes  Recent Labs Lab 08/20/14 0003 08/20/14 0510 08/20/14 1037  TROPONINI 1.03* 4.15* 6.44*   Glucose  Recent Labs Lab 08/23/14 0246 08/23/14 0348 08/23/14 0439 08/23/14 0606 08/23/14 0715 08/23/14 0802  GLUCAP 322* 334* 332* 286* 255* 266*     Recent Results (from the past 240 hour(s))  MRSA PCR Screening     Status: None   Collection Time: 08/18/2014  6:02 PM  Result Value Ref Range  Status   MRSA by PCR NEGATIVE NEGATIVE Final    Comment:        The GeneXpert MRSA Assay (FDA approved for NASAL specimens only), is one component of a comprehensive MRSA colonization surveillance program. It is not intended to diagnose MRSA infection nor to guide or monitor treatment for MRSA infections.   Blood Culture (routine x 2)     Status: None (Preliminary result)   Collection Time: 08/09/2014  7:15 PM  Result Value Ref Range Status   Specimen Description BLOOD RIGHT ARM  Final   Special Requests BOTTLES DRAWN AEROBIC AND ANAEROBIC 4CC  Final   Culture NO GROWTH 4 DAYS  Final   Report Status PENDING  Incomplete  Blood Culture (routine x 2)     Status: None (Preliminary result)   Collection Time: 08/31/2014  7:25 PM  Result Value Ref Range Status   Specimen Description BLOOD LEFT ASSIST CONTROL  Final   Special Requests BOTTLES DRAWN AEROBIC AND ANAEROBIC 4CC  Final   Culture NO GROWTH 4 DAYS  Final   Report Status PENDING  Incomplete  Urine culture     Status: None   Collection Time: 08/14/2014  8:25 PM  Result Value Ref Range Status   Specimen Description URINE, RANDOM  Final   Special Requests NONE  Final   Culture   Final    60,000 COLONIES/ml ESCHERICHIA COLI Results Called to: Janett Billow GREGORY AT [id number] 08/22/14 [facility] ESBL-EXTENDED SPECTRUM BETA LACTAMASE-THE ORGANISM IS RESISTANT TO PENICILLINS, CEPHALOSPORINS AND AZTREONAM ACCORDING TO CLSI M100-S15 VOL.[place].    Report Status 08/22/2014 FINAL  Final   Organism ID, Bacteria ESCHERICHIA COLI  Final      Susceptibility   Escherichia coli - MIC*    AMPICILLIN >=32 RESISTANT Resistant     CEFTAZIDIME 4 RESISTANT Resistant     CEFAZOLIN >=64 RESISTANT Resistant  CEFTRIAXONE >=64 RESISTANT Resistant     CIPROFLOXACIN >=4 RESISTANT Resistant     GENTAMICIN <=1 SENSITIVE Sensitive     IMIPENEM <=0.25 SENSITIVE Sensitive     TRIMETH/SULFA <=20 SENSITIVE Sensitive     Extended ESBL POSITIVE Resistant      NITROFURANTOIN Value in next row Sensitive      SENSITIVE<=16    PIP/TAZO Value in next row Sensitive      SENSITIVE<=4    AMPICILLIN/SULBACTAM Value in next row Sensitive      SENSITIVE8    * 60,000 COLONIES/ml ESCHERICHIA COLI  Culture, expectorated sputum-assessment     Status: None   Collection Time: 08/20/14 10:05 AM  Result Value Ref Range Status   Specimen Description EXPECTORATED SPUTUM  Final   Special Requests Normal  Final   Sputum evaluation THIS SPECIMEN IS ACCEPTABLE FOR SPUTUM CULTURE  Final   Report Status 08/20/2014 FINAL  Final  Culture, respiratory (NON-Expectorated)     Status: None (Preliminary result)   Collection Time: 08/20/14 10:05 AM  Result Value Ref Range Status   Specimen Description EXPECTORATED SPUTUM  Final   Special Requests Normal Reflexed from [id number]  Final   Gram Stain   Final    MANY WBC SEEN NO ORGANISMS SEEN EXCELLENT SPECIMEN - 90-100% WBCS    Culture   Final    LIGHT GROWTH ESCHERICHIA COLI CRITICAL VALUE NOTED.  VALUE IS CONSISTENT WITH PREVIOUSLY REPORTED AND CALLED VALUE.    Report Status PENDING  Incomplete   Organism ID, Bacteria ESCHERICHIA COLI  Final      Susceptibility   Escherichia coli - MIC*    AMPICILLIN >=32 RESISTANT Resistant     CEFAZOLIN >=64 RESISTANT Resistant     CEFTRIAXONE >=64 RESISTANT Resistant     CIPROFLOXACIN >=4 RESISTANT Resistant     GENTAMICIN <=1 SENSITIVE Sensitive     IMIPENEM <=0.25 SENSITIVE Sensitive     NITROFURANTOIN <=16 SENSITIVE Sensitive     TRIMETH/SULFA <=20 SENSITIVE Sensitive     Extended ESBL POSITIVE Resistant     PIP/TAZO Value in next row Resistant      RESISTANT>=64    * LIGHT GROWTH ESCHERICHIA COLI     Current facility-administered medications:  .  acetaminophen (TYLENOL) tablet 650 mg, 650 mg, Oral, Q6H PRN, 650 mg at 08/22/14 0436 **OR** acetaminophen (TYLENOL) suppository 650 mg, 650 mg, Rectal, Q6H PRN, Lytle Butte, MD .  antiseptic oral rinse solution (CORINZ), 7 mL,  Mouth Rinse, QID, Theodoro Grist, MD, 7 mL at 08/23/14 0400 .  aspirin chewable tablet 81 mg, 81 mg, Oral, Daily, Charlett Nose, RPH, 81 mg at 08/22/14 1113 .  atorvastatin (LIPITOR) tablet 20 mg, 20 mg, Oral, QHS, Lytle Butte, MD, 20 mg at 08/22/14 2126 .  budesonide (PULMICORT) nebulizer solution 0.5 mg, 0.5 mg, Nebulization, BID, Lytle Butte, MD, 0.5 mg at 08/23/14 0741 .  chlorhexidine gluconate (PERIDEX) 0.12 % solution 15 mL, 15 mL, Mouth Rinse, BID, Theodoro Grist, MD, 15 mL at 08/22/14 2200 .  darifenacin (ENABLEX) 24 hr tablet 7.5 mg, 7.5 mg, Oral, Daily, Lytle Butte, MD, 7.5 mg at 08/22/14 1112 .  dexmedetomidine (PRECEDEX) 400 MCG/100ML (4 mcg/mL) infusion, 0.4-1.2 mcg/kg/hr, Intravenous, Titrated, Theodoro Grist, MD, Last Rate: 6.5 mL/hr at 08/23/14 0754, 0.4 mcg/kg/hr at 08/23/14 0754 .  feeding supplement (VITAL AF 1.2 CAL) liquid 1,000 mL, 1,000 mL, Per Tube, Continuous, Flora Lipps, MD, Last Rate: 57 mL/hr at 08/23/14 0812, 1,000 mL at 08/23/14 0812 .  fentaNYL (SUBLIMAZE) injection 50 mcg, 50 mcg, Intravenous, Q2H PRN, Vishal Mungal, MD .  fentaNYL 2559mcg in NS 277mL (20mcg/ml) infusion-PREMIX, 10 mcg/hr, Intravenous, Continuous, Theodoro Grist, MD, Last Rate: 20 mL/hr at 08/23/14 0751, 200 mcg/hr at 08/23/14 0751 .  folic acid (FOLVITE) tablet 1 mg, 1 mg, Oral, Daily, Flora Lipps, MD, 1 mg at 08/22/14 1112 .  free water 25 mL, 25 mL, Per Tube, 6 times per day, Vishal Mungal, MD, 25 mL at 08/23/14 0800 .  heparin ADULT infusion 100 units/mL (25000 units/250 mL), 200 Units/hr, Intravenous, Continuous, Theodoro Grist, MD, Last Rate: 2 mL/hr at 08/22/14 2355, 200 Units/hr at 08/22/14 2355 .  imipenem-cilastatin (PRIMAXIN) 250 mg in sodium chloride 0.9 % 100 mL IVPB, 250 mg, Intravenous, 3 times per day, Charlett Nose, RPH, 250 mg at 08/23/14 5176 .  insulin regular (NOVOLIN R,HUMULIN R) 250 Units in sodium chloride 0.9 % 250 mL (1 Units/mL) infusion, , Intravenous, Continuous,  Theodoro Grist, MD, Last Rate: 16.5 mL/hr at 08/23/14 0807 .  ipratropium-albuterol (DUONEB) 0.5-2.5 (3) MG/3ML nebulizer solution 3 mL, 3 mL, Nebulization, Q4H, Lytle Butte, MD, 3 mL at 08/23/14 0741 .  methylPREDNISolone sodium succinate (SOLU-MEDROL) 40 mg/mL injection 40 mg, 40 mg, Intravenous, Q12H, Flora Lipps, MD, 40 mg at 08/22/14 2126 .  norepinephrine (LEVOPHED) 4mg  in D5W 269mL premix infusion, 0-40 mcg/min, Intravenous, Titrated, Theodoro Grist, MD, Last Rate: 30 mL/hr at 08/23/14 0240, 8 mcg/min at 08/23/14 0240 .  ondansetron (ZOFRAN) tablet 4 mg, 4 mg, Oral, Q6H PRN **OR** ondansetron (ZOFRAN) injection 4 mg, 4 mg, Intravenous, Q6H PRN, Lytle Butte, MD .  senna-docusate (Senokot-S) tablet 2 tablet, 2 tablet, Oral, BID, Flora Lipps, MD, 2 tablet at 08/22/14 2126 .  sennosides (SENOKOT) 8.8 MG/5ML syrup 5 mL, 5 mL, Per Tube, QHS PRN, Vishal Mungal, MD .  sodium chloride 0.9 % injection 3 mL, 3 mL, Intravenous, Q12H, Lytle Butte, MD, 3 mL at 08/22/14 2200 .  tamsulosin (FLOMAX) capsule 0.4 mg, 0.4 mg, Oral, Daily, Lytle Butte, MD, 0.4 mg at 08/22/14 1113  IMAGING    Dg Chest Port 1 View  08/23/2014   CLINICAL DATA:  Ventilated patient, respiratory failure.  EXAM: PORTABLE CHEST - 1 VIEW  COMPARISON:  Portable chest x-ray of August 21, 2014  FINDINGS: The lungs are reasonably well inflated. The left lower lobe remains dense and the hemidiaphragm is less well demonstrated today. Slightly increased density at the right lung base has progressed as well. The heart is top-normal in size. The pulmonary vascularity is not clearly engorged.  The endotracheal tube tip lies approximately 4 cm above the carina. The esophagogastric tube tip projects below the inferior margin of the image. The right PICC line tip projects over the proximal SVC.  IMPRESSION: There has been slight interval deterioration in the appearance of both lungs with increased pleural fluid layering posteriorly and some  worsening of bibasilar atelectasis.   Electronically Signed   By: David  Martinique M.D.   On: 08/23/2014 07:33     A\P  81 yo male admitted for sepsis, respiratory failure, intubated, past medical history of prostate cancer currently receiving chemotherapy Patient with acute pneumonia with COPD exacerbation with CHF exacerbation  PULMONARY  A: Acute respiratory failure-COPD exacerbation Pulmonary edema Respiratory failure requiring mechanical ventilation Ventilator dyssynchrony P:  Continue with mechanical ventilation, wean as tolerated, not ready for vent weaning today Sedation/analgesia-as needed Follow-up respiratory cultures - light chain GNR - on Vanc\Zosyn\Azithro -start steroids -will  consider bronchoscopy  CARDIOVASCULAR CVL - Right PICC ([facility]) 8/13 A:  Elevated troponin Non-STEMI P:  Follow-up cardiac enzymes Continue with heparin drip Follow Cardiology consultation Possibly demand ischemia from respiratory failure ECHO 8/13 - EF 30-35%  RENAL A:  Electrolyte abnormalities - hypokalemia, hypocalcemia  P:  Worsening renal function, needs dialysis Follow up with Nephrology  GASTROINTESTINAL P:  Continue with PPI for stress ulcer prophylaxis Dietary consult Started tube feeds  HEMATOLOGIC A:  Leukocytosis Sepsis P:  Elevated white blood count possibly due to sepsis versus leukemoid reaction Continue to monitor CBC Continue with current antibiotics   INFECTIOUS A: Sepsis P:  BCx2 8/12>>NTD UC 8/12>>ESBL Sputum 8/12>>light chain GNR-E coli Abx: Vanc/Zosyn, start date 8/12,  Will review ABX coverage   NEUROLOGIC P: Intubated and sedated RASS goal: -1 to -2     I have personally obtained a history, examined the patient, evaluated Pertinent laboratory and RadioGraphic/imaging results, and  formulated the assessment and plan   The Patient requires high complexity decision making for assessment and support,  frequent evaluation and titration of therapies, application of advanced monitoring technologies and extensive interpretation of multiple databases. Critical Care Time devoted to patient care services described in this note is 45  minutes.   Overall, patient is critically ill, prognosis is guarded.  Patient with Multiorgan failure and at high risk for cardiac arrest and death. Prognosis is very poor   Corrin Parker, M.D.  [facility]  Medical Director [place] Director [place] Department       08/23/2014, 8:22 AM

## 2014-08-23 NOTE — Progress Notes (Signed)
[place] NOTE  Pharmacy Consult for Electrolyte Management, CRRT Medication Management, Constipation Management, Primaxin Dosing Indication: ICU Status requiring mechanical ventilation and CRRT   No Known Allergies  Patient Measurements: Height: 5\' 6"  (167.6 cm) Weight: 174 lb 9.7 oz (79.2 kg) IBW/kg (Calculated) : 63.8   Vital Signs: Temp: 95.8 F (35.4 C) (08/16 1715) Temp Source: Axillary (08/16 1715) BP: 82/60 mmHg (08/16 1745) Pulse Rate: 122 (08/16 1745) Intake/Output from previous day: 08/15 0701 - 08/16 0700 In: 3638.2 [I.V.:2131.3; NG/GT:1207; IV Piggyback:300] Out: 325 [Urine:325] Intake/Output from this shift: Total I/O In: 1827.4 [I.V.:1063; NG/GT:554.5; IV Piggyback:210] Out: 135 [Urine:135] Vent settings for last 24 hours: Vent Mode:  [-] PRVC FiO2 (%):  [60 %-100 %] 75 % Set Rate:  [20 bmp] 20 bmp Vt Set:  [530 mL] 530 mL PEEP:  [5 cmH20] 5 cmH20  Labs:  Recent Labs  08/21/14 1154  08/22/14 0426 08/22/14 0953 08/22/14 1749 08/23/14 0515 08/23/14 1007 08/23/14 1430  WBC  --   --  21.1*  --  25.0* 32.6*  --   --   HGB  --   --  7.7*  --  7.6* 8.7*  --   --   HCT  --   --  24.2*  --  25.0* 28.0*  --   --   PLT  --   --  129*  --  134* 231  --   --   APTT 51*  --   --  >200* >200*  --  40*  --   INR 1.93  --   --   --   --   --   --   --   CREATININE  --   < > 2.05*  --   --  2.89*  --  2.59*  MG  --   --  1.8  --   --   --  2.1 2.1  PHOS  --   --  2.1*  --   --   --   --  4.0  ALBUMIN  --   --  2.0*  --   --   --   --  2.0*  < > = values in this interval not displayed. Estimated Creatinine Clearance: 20.6 mL/min (by C-G formula based on Cr of 2.59).   Recent Labs  08/23/14 1601 08/23/14 1651 08/23/14 1757  GLUCAP 116* 122* 135*    Microbiology: Recent Results (from the past 720 hour(s))  MRSA PCR Screening     Status: None   Collection Time: 08/10/2014  6:02 PM  Result Value Ref Range Status   MRSA by PCR  NEGATIVE NEGATIVE Final    Comment:        The GeneXpert MRSA Assay (FDA approved for NASAL specimens only), is one component of a comprehensive MRSA colonization surveillance program. It is not intended to diagnose MRSA infection nor to guide or monitor treatment for MRSA infections.   Blood Culture (routine x 2)     Status: None (Preliminary result)   Collection Time: 08/27/2014  7:15 PM  Result Value Ref Range Status   Specimen Description BLOOD RIGHT ARM  Final   Special Requests BOTTLES DRAWN AEROBIC AND ANAEROBIC 4CC  Final   Culture NO GROWTH 4 DAYS  Final   Report Status PENDING  Incomplete  Blood Culture (routine x 2)     Status: None (Preliminary result)   Collection Time: 08/17/2014  7:25 PM  Result Value Ref Range  Status   Specimen Description BLOOD LEFT ASSIST CONTROL  Final   Special Requests BOTTLES DRAWN AEROBIC AND ANAEROBIC 4CC  Final   Culture NO GROWTH 4 DAYS  Final   Report Status PENDING  Incomplete  Urine culture     Status: None   Collection Time: 08/23/2014  8:25 PM  Result Value Ref Range Status   Specimen Description URINE, RANDOM  Final   Special Requests NONE  Final   Culture   Final    60,000 COLONIES/ml ESCHERICHIA COLI Results Called to: Janett Billow GREGORY AT 6599 08/22/14 [facility] ESBL-EXTENDED SPECTRUM BETA LACTAMASE-THE ORGANISM IS RESISTANT TO PENICILLINS, CEPHALOSPORINS AND AZTREONAM ACCORDING TO CLSI M100-S15 VOL.[place].    Report Status 08/22/2014 FINAL  Final   Organism ID, Bacteria ESCHERICHIA COLI  Final      Susceptibility   Escherichia coli - MIC*    AMPICILLIN >=32 RESISTANT Resistant     CEFTAZIDIME 4 RESISTANT Resistant     CEFAZOLIN >=64 RESISTANT Resistant     CEFTRIAXONE >=64 RESISTANT Resistant     CIPROFLOXACIN >=4 RESISTANT Resistant     GENTAMICIN <=1 SENSITIVE Sensitive     IMIPENEM <=0.25 SENSITIVE Sensitive     TRIMETH/SULFA <=20 SENSITIVE Sensitive     Extended ESBL POSITIVE Resistant     NITROFURANTOIN Value in  next row Sensitive      SENSITIVE<=16    PIP/TAZO Value in next row Sensitive      SENSITIVE<=4    AMPICILLIN/SULBACTAM Value in next row Sensitive      SENSITIVE8    * 60,000 COLONIES/ml ESCHERICHIA COLI  Culture, expectorated sputum-assessment     Status: None   Collection Time: 08/20/14 10:05 AM  Result Value Ref Range Status   Specimen Description EXPECTORATED SPUTUM  Final   Special Requests Normal  Final   Sputum evaluation THIS SPECIMEN IS ACCEPTABLE FOR SPUTUM CULTURE  Final   Report Status 08/20/2014 FINAL  Final  Culture, respiratory (NON-Expectorated)     Status: None   Collection Time: 08/20/14 10:05 AM  Result Value Ref Range Status   Specimen Description EXPECTORATED SPUTUM  Final   Special Requests Normal Reflexed from [id number]  Final   Gram Stain   Final    MANY WBC SEEN NO ORGANISMS SEEN EXCELLENT SPECIMEN - 90-100% WBCS    Culture   Final    LIGHT GROWTH ESCHERICHIA COLI ESBL-EXTENDED SPECTRUM BETA LACTAMASE-THE ORGANISM IS RESISTANT TO PENICILLINS, CEPHALOSPORINS AND AZTREONAM ACCORDING TO CLSI M100-S15 VOL.[place]. ESBL ECOLI ALREADY REPORTED IN URINE CULTURE 08/22/14 [facility]    Report Status 08/23/2014 FINAL  Final   Organism ID, Bacteria ESCHERICHIA COLI  Final      Susceptibility   Escherichia coli - MIC*    AMPICILLIN >=32 RESISTANT Resistant     CEFAZOLIN >=64 RESISTANT Resistant     CEFTRIAXONE >=64 RESISTANT Resistant     CIPROFLOXACIN >=4 RESISTANT Resistant     GENTAMICIN <=1 SENSITIVE Sensitive     IMIPENEM <=0.25 SENSITIVE Sensitive     NITROFURANTOIN <=16 SENSITIVE Sensitive     TRIMETH/SULFA <=20 SENSITIVE Sensitive     Extended ESBL POSITIVE Resistant     PIP/TAZO Value in next row Resistant      RESISTANT>=64    * LIGHT GROWTH ESCHERICHIA COLI    Medications:  Scheduled:  . amiodarone  150 mg Intravenous Once  . antiseptic oral rinse  7 mL Mouth Rinse QID  . aspirin  81 mg Oral Daily  .  atorvastatin  20 mg Oral QHS  . budesonide   0.5 mg Nebulization BID  . chlorhexidine gluconate  15 mL Mouth Rinse BID  . folic acid  1 mg Oral Daily  . free water  25 mL Per Tube 6 times per day  . imipenem-cilastatin  500 mg Intravenous 3 times per day  . ipratropium-albuterol  3 mL Nebulization Q4H  . [START ON 08/24/2014] methylPREDNISolone (SOLU-MEDROL) injection  20 mg Intravenous Daily  . metoCLOPramide (REGLAN) injection  5 mg Intravenous 3 times per day  . senna-docusate  2 tablet Oral BID  . sodium chloride  3 mL Intravenous Q12H   Infusions:  . amiodarone     Followed by  . amiodarone    . dexmedetomidine 0.7 mcg/kg/hr (08/23/14 1728)  . feeding supplement (VITAL AF 1.2 CAL) 1,000 mL (08/23/14 1313)  . fentaNYL infusion INTRAVENOUS 250 mcg/hr (08/23/14 1717)  . heparin 400 Units/hr (08/23/14 1423)  . insulin (NOVOLIN-R) infusion 3.3 mL/hr at 08/23/14 1703  . norepinephrine 15 mcg/min (08/23/14 1730)  . pureflow 2,500 mL/hr at 08/23/14 1245    Assessment: 81 yo male ICU patient requiring mechanical ventilation and CRRT. Patient on day 2 of Primaxin to cover ESBL.    Plan:   1. Electrolytes: Will replace calcium gluconate 1g IV x 1 and Potassium Phos 1 packet PO x 2 doses. Will obtain follow-up labs with am labs.    2. CRRT: No further adjustments warranted at this time for medications.    3. Constipation: Will continue senna/docusate 2 tabs - will increase frequency to TID and order docusate suppository x 1.    4. Primaxin Dosing: Will increase patient to Primaxin 500mg  Q8hr while on CRRT.    Pharmacy will continue to monitor and adjust per consult.    Simpson,Michael L 08/23/2014,6:04 PM

## 2014-08-24 DIAGNOSIS — D72829 Elevated white blood cell count, unspecified: Secondary | ICD-10-CM

## 2014-08-24 DIAGNOSIS — E785 Hyperlipidemia, unspecified: Secondary | ICD-10-CM

## 2014-08-24 DIAGNOSIS — I255 Ischemic cardiomyopathy: Secondary | ICD-10-CM

## 2014-08-24 DIAGNOSIS — N179 Acute kidney failure, unspecified: Secondary | ICD-10-CM

## 2014-08-24 DIAGNOSIS — C61 Malignant neoplasm of prostate: Secondary | ICD-10-CM

## 2014-08-24 DIAGNOSIS — Z515 Encounter for palliative care: Secondary | ICD-10-CM

## 2014-08-24 DIAGNOSIS — J9600 Acute respiratory failure, unspecified whether with hypoxia or hypercapnia: Secondary | ICD-10-CM

## 2014-08-24 DIAGNOSIS — Z9049 Acquired absence of other specified parts of digestive tract: Secondary | ICD-10-CM

## 2014-08-24 DIAGNOSIS — Z87891 Personal history of nicotine dependence: Secondary | ICD-10-CM

## 2014-08-24 DIAGNOSIS — I4891 Unspecified atrial fibrillation: Secondary | ICD-10-CM

## 2014-08-24 DIAGNOSIS — Z794 Long term (current) use of insulin: Secondary | ICD-10-CM

## 2014-08-24 DIAGNOSIS — I2510 Atherosclerotic heart disease of native coronary artery without angina pectoris: Secondary | ICD-10-CM

## 2014-08-24 DIAGNOSIS — E871 Hypo-osmolality and hyponatremia: Secondary | ICD-10-CM

## 2014-08-24 DIAGNOSIS — A419 Sepsis, unspecified organism: Secondary | ICD-10-CM

## 2014-08-24 DIAGNOSIS — K625 Hemorrhage of anus and rectum: Secondary | ICD-10-CM

## 2014-08-24 DIAGNOSIS — E43 Unspecified severe protein-calorie malnutrition: Secondary | ICD-10-CM

## 2014-08-24 DIAGNOSIS — E119 Type 2 diabetes mellitus without complications: Secondary | ICD-10-CM

## 2014-08-24 DIAGNOSIS — Z79899 Other long term (current) drug therapy: Secondary | ICD-10-CM

## 2014-08-24 DIAGNOSIS — I10 Essential (primary) hypertension: Secondary | ICD-10-CM

## 2014-08-24 DIAGNOSIS — Z66 Do not resuscitate: Secondary | ICD-10-CM

## 2014-08-24 DIAGNOSIS — D649 Anemia, unspecified: Secondary | ICD-10-CM

## 2014-08-24 LAB — BLOOD GAS, ARTERIAL
ACID-BASE DEFICIT: 2.2 mmol/L — AB (ref 0.0–2.0)
Allens test (pass/fail): POSITIVE — AB
BICARBONATE: 25.4 meq/L (ref 21.0–28.0)
FIO2: 0.95
LHR: 20 {breaths}/min
MECHVT: 530 mL
Mechanical Rate: 20
O2 SAT: 93.8 %
PATIENT TEMPERATURE: 37
PCO2 ART: 54 mmHg — AB (ref 32.0–48.0)
PEEP/CPAP: 12 cmH2O
PO2 ART: 79 mmHg — AB (ref 83.0–108.0)
pH, Arterial: 7.28 — ABNORMAL LOW (ref 7.350–7.450)

## 2014-08-24 LAB — GLUCOSE, CAPILLARY
GLUCOSE-CAPILLARY: 118 mg/dL — AB (ref 65–99)
GLUCOSE-CAPILLARY: 126 mg/dL — AB (ref 65–99)
GLUCOSE-CAPILLARY: 136 mg/dL — AB (ref 65–99)
GLUCOSE-CAPILLARY: 137 mg/dL — AB (ref 65–99)
GLUCOSE-CAPILLARY: 142 mg/dL — AB (ref 65–99)
GLUCOSE-CAPILLARY: 145 mg/dL — AB (ref 65–99)
GLUCOSE-CAPILLARY: 160 mg/dL — AB (ref 65–99)
GLUCOSE-CAPILLARY: 161 mg/dL — AB (ref 65–99)
GLUCOSE-CAPILLARY: 165 mg/dL — AB (ref 65–99)
GLUCOSE-CAPILLARY: 201 mg/dL — AB (ref 65–99)
Glucose-Capillary: 145 mg/dL — ABNORMAL HIGH (ref 65–99)
Glucose-Capillary: 185 mg/dL — ABNORMAL HIGH (ref 65–99)
Glucose-Capillary: 145 mg/dL — ABNORMAL HIGH (ref 65–99)
Glucose-Capillary: 149 mg/dL — ABNORMAL HIGH (ref 65–99)
Glucose-Capillary: 116 mg/dL — ABNORMAL HIGH (ref 65–99)
Glucose-Capillary: 130 mg/dL — ABNORMAL HIGH (ref 65–99)
Glucose-Capillary: 130 mg/dL — ABNORMAL HIGH (ref 65–99)
Glucose-Capillary: 147 mg/dL — ABNORMAL HIGH (ref 65–99)
Glucose-Capillary: 160 mg/dL — ABNORMAL HIGH (ref 65–99)
Glucose-Capillary: 182 mg/dL — ABNORMAL HIGH (ref 65–99)
Glucose-Capillary: 139 mg/dL — ABNORMAL HIGH (ref 65–99)
Glucose-Capillary: 122 mg/dL — ABNORMAL HIGH (ref 65–99)
Glucose-Capillary: 145 mg/dL — ABNORMAL HIGH (ref 65–99)

## 2014-08-24 LAB — RENAL FUNCTION PANEL
ALBUMIN: 1.8 g/dL — AB (ref 3.5–5.0)
ALBUMIN: 1.8 g/dL — AB (ref 3.5–5.0)
ALBUMIN: 1.9 g/dL — AB (ref 3.5–5.0)
ALBUMIN: 1.9 g/dL — AB (ref 3.5–5.0)
ALBUMIN: 2 g/dL — AB (ref 3.5–5.0)
ANION GAP: 10 (ref 5–15)
ANION GAP: 6 (ref 5–15)
ANION GAP: 7 (ref 5–15)
ANION GAP: 9 (ref 5–15)
Albumin: 1.8 g/dL — ABNORMAL LOW (ref 3.5–5.0)
Anion gap: 9 (ref 5–15)
Anion gap: 6 (ref 5–15)
BUN: 24 mg/dL — ABNORMAL HIGH (ref 6–20)
BUN: 25 mg/dL — ABNORMAL HIGH (ref 6–20)
BUN: 27 mg/dL — AB (ref 6–20)
BUN: 27 mg/dL — AB (ref 6–20)
BUN: 33 mg/dL — ABNORMAL HIGH (ref 6–20)
BUN: 34 mg/dL — ABNORMAL HIGH (ref 6–20)
CALCIUM: 6.8 mg/dL — AB (ref 8.9–10.3)
CALCIUM: 7.2 mg/dL — AB (ref 8.9–10.3)
CHLORIDE: 105 mmol/L (ref 101–111)
CHLORIDE: 101 mmol/L (ref 101–111)
CHLORIDE: 98 mmol/L — AB (ref 101–111)
CO2: 22 mmol/L (ref 22–32)
CO2: 22 mmol/L (ref 22–32)
CO2: 22 mmol/L (ref 22–32)
CO2: 24 mmol/L (ref 22–32)
CO2: 24 mmol/L (ref 22–32)
CO2: 25 mmol/L (ref 22–32)
CREATININE: 1.19 mg/dL (ref 0.61–1.24)
CREATININE: 1.29 mg/dL — AB (ref 0.61–1.24)
CREATININE: 1.88 mg/dL — AB (ref 0.61–1.24)
CREATININE: 1.99 mg/dL — AB (ref 0.61–1.24)
Calcium: 6.8 mg/dL — ABNORMAL LOW (ref 8.9–10.3)
Calcium: 6.8 mg/dL — ABNORMAL LOW (ref 8.9–10.3)
Calcium: 6.8 mg/dL — ABNORMAL LOW (ref 8.9–10.3)
Calcium: 6.8 mg/dL — ABNORMAL LOW (ref 8.9–10.3)
Chloride: 100 mmol/L — ABNORMAL LOW (ref 101–111)
Chloride: 102 mmol/L (ref 101–111)
Chloride: 103 mmol/L (ref 101–111)
Creatinine, Ser: 1.21 mg/dL (ref 0.61–1.24)
Creatinine, Ser: 1.18 mg/dL (ref 0.61–1.24)
GFR calc Af Amer: 57 mL/min — ABNORMAL LOW (ref >60)
GFR calc Af Amer: >60 mL/min (ref >60)
GFR calc non Af Amer: 31 mL/min — ABNORMAL LOW (ref >60)
GFR calc non Af Amer: 53 mL/min — ABNORMAL LOW (ref >60)
GFR, EST AFRICAN AMERICAN: 34 mL/min — AB (ref >60)
GFR, EST AFRICAN AMERICAN: 36 mL/min — AB (ref >60)
GFR, EST NON AFRICAN AMERICAN: 29 mL/min — AB (ref >60)
GFR, EST NON AFRICAN AMERICAN: 49 mL/min — AB (ref >60)
GFR, EST NON AFRICAN AMERICAN: 54 mL/min — AB (ref >60)
GFR, EST NON AFRICAN AMERICAN: 54 mL/min — AB (ref >60)
GLUCOSE: 172 mg/dL — AB (ref 65–99)
GLUCOSE: 187 mg/dL — AB (ref 65–99)
GLUCOSE: 257 mg/dL — AB (ref 65–99)
Glucose, Bld: 130 mg/dL — ABNORMAL HIGH (ref 65–99)
Glucose, Bld: 186 mg/dL — ABNORMAL HIGH (ref 65–99)
Glucose, Bld: 365 mg/dL — ABNORMAL HIGH (ref 65–99)
PHOSPHORUS: 2.6 mg/dL (ref 2.5–4.6)
PHOSPHORUS: 3.1 mg/dL (ref 2.5–4.6)
PHOSPHORUS: 3.3 mg/dL (ref 2.5–4.6)
PHOSPHORUS: 4 mg/dL (ref 2.5–4.6)
PHOSPHORUS: 4.3 mg/dL (ref 2.5–4.6)
POTASSIUM: 4.9 mmol/L (ref 3.5–5.1)
POTASSIUM: 4.4 mmol/L (ref 3.5–5.1)
POTASSIUM: 4.7 mmol/L (ref 3.5–5.1)
POTASSIUM: 5 mmol/L (ref 3.5–5.1)
POTASSIUM: 5.3 mmol/L — AB (ref 3.5–5.1)
Phosphorus: 3.1 mg/dL (ref 2.5–4.6)
Potassium: 5 mmol/L (ref 3.5–5.1)
SODIUM: 131 mmol/L — AB (ref 135–145)
SODIUM: 134 mmol/L — AB (ref 135–145)
SODIUM: 134 mmol/L — AB (ref 135–145)
Sodium: 136 mmol/L (ref 135–145)
Sodium: 131 mmol/L — ABNORMAL LOW (ref 135–145)
Sodium: 129 mmol/L — ABNORMAL LOW (ref 135–145)

## 2014-08-24 LAB — PROTEIN ELECTROPHORESIS, SERUM
A/G RATIO SPE: 0.7 (ref 0.7–1.7)
ALPHA-2-GLOBULIN: 0.8 g/dL (ref 0.4–1.0)
Albumin ELP: 1.8 g/dL — ABNORMAL LOW (ref 2.9–4.4)
Alpha-1-Globulin: 0.6 g/dL — ABNORMAL HIGH (ref 0.0–0.4)
Beta Globulin: 0.8 g/dL (ref 0.7–1.3)
GLOBULIN, TOTAL: 2.7 g/dL (ref 2.2–3.9)
Gamma Globulin: 0.5 g/dL (ref 0.4–1.8)
TOTAL PROTEIN ELP: 4.5 g/dL — AB (ref 6.0–8.5)

## 2014-08-24 LAB — CULTURE, BLOOD (ROUTINE X 2)
CULTURE: NO GROWTH
Culture: NO GROWTH

## 2014-08-24 LAB — BASIC METABOLIC PANEL
ANION GAP: 4 — AB (ref 5–15)
BUN: 29 mg/dL — AB (ref 6–20)
CALCIUM: 6.8 mg/dL — AB (ref 8.9–10.3)
CO2: 26 mmol/L (ref 22–32)
CREATININE: 1.54 mg/dL — AB (ref 0.61–1.24)
Chloride: 106 mmol/L (ref 101–111)
GFR calc Af Amer: 46 mL/min — ABNORMAL LOW (ref >60)
GFR, EST NON AFRICAN AMERICAN: 39 mL/min — AB (ref >60)
GLUCOSE: 169 mg/dL — AB (ref 65–99)
Potassium: 4.9 mmol/L (ref 3.5–5.1)
Sodium: 136 mmol/L (ref 135–145)

## 2014-08-24 LAB — APTT
aPTT: 23 seconds — ABNORMAL LOW (ref 24–36)
aPTT: 37 seconds — ABNORMAL HIGH (ref 24–36)

## 2014-08-24 LAB — MAGNESIUM
MAGNESIUM: 1.9 mg/dL (ref 1.7–2.4)
MAGNESIUM: 1.8 mg/dL (ref 1.7–2.4)
MAGNESIUM: 1.8 mg/dL (ref 1.7–2.4)
Magnesium: 1.9 mg/dL (ref 1.7–2.4)
Magnesium: 1.9 mg/dL (ref 1.7–2.4)

## 2014-08-24 LAB — CBC
HCT: 24.4 % — ABNORMAL LOW (ref 40.0–52.0)
HEMOGLOBIN: 7.6 g/dL — AB (ref 13.0–18.0)
MCH: 25.3 pg — AB (ref 26.0–34.0)
MCHC: 31 g/dL — AB (ref 32.0–36.0)
MCV: 81.7 fL (ref 80.0–100.0)
PLATELETS: 240 10*3/uL (ref 150–440)
RBC: 2.99 MIL/uL — ABNORMAL LOW (ref 4.40–5.90)
RDW: 19.4 % — AB (ref 11.5–14.5)
WBC: 49 10*3/uL — ABNORMAL HIGH (ref 3.8–10.6)

## 2014-08-24 LAB — HEPARIN LEVEL (UNFRACTIONATED): Heparin Unfractionated: 1.05 IU/mL — ABNORMAL HIGH (ref 0.30–0.70)

## 2014-08-24 MED ORDER — VECURONIUM BROMIDE 10 MG IV SOLR
INTRAVENOUS | Status: AC
  Administered 2014-08-24: 09:00:00
  Filled 2014-08-24: qty 10

## 2014-08-24 MED ORDER — HEPARIN BOLUS VIA INFUSION
3000.0000 [IU] | Freq: Once | INTRAVENOUS | Status: AC
  Administered 2014-08-24: 3000 [IU] via INTRAVENOUS
  Filled 2014-08-24: qty 3000

## 2014-08-24 MED ORDER — MIDAZOLAM HCL 2 MG/2ML IJ SOLN
4.0000 mg | Freq: Once | INTRAMUSCULAR | Status: AC
  Administered 2014-08-24: 4 mg via INTRAVENOUS

## 2014-08-24 MED ORDER — DIGOXIN 0.25 MG/ML IJ SOLN
0.2500 mg | Freq: Once | INTRAMUSCULAR | Status: AC
  Administered 2014-08-24: 0.25 mg via INTRAVENOUS
  Filled 2014-08-24: qty 2

## 2014-08-24 MED ORDER — STERILE WATER FOR INJECTION IJ SOLN
INTRAMUSCULAR | Status: AC
  Administered 2014-08-24: 10 mL via INTRAMUSCULAR
  Filled 2014-08-24: qty 10

## 2014-08-24 MED ORDER — VECURONIUM BROMIDE 10 MG IV SOLR
INTRAVENOUS | Status: AC
  Administered 2014-08-24: 10 mg via INTRAVENOUS
  Filled 2014-08-24: qty 10

## 2014-08-24 MED ORDER — MIDAZOLAM HCL 2 MG/2ML IJ SOLN
2.0000 mg | INTRAMUSCULAR | Status: DC | PRN
  Administered 2014-08-24: 2 mg via INTRAVENOUS
  Administered 2014-08-25: 4 mg via INTRAVENOUS
  Filled 2014-08-24: qty 2
  Filled 2014-08-24: qty 4

## 2014-08-24 MED ORDER — STERILE WATER FOR INJECTION IJ SOLN
INTRAMUSCULAR | Status: AC
  Administered 2014-08-24: 09:00:00
  Filled 2014-08-24: qty 10

## 2014-08-24 MED ORDER — VECURONIUM BROMIDE 10 MG IV SOLR
10.0000 mg | Freq: Once | INTRAVENOUS | Status: AC
  Administered 2014-08-24: 10 mg via INTRAVENOUS

## 2014-08-24 MED ORDER — MIDAZOLAM HCL 2 MG/2ML IJ SOLN
INTRAMUSCULAR | Status: AC
  Administered 2014-08-24: 4 mg via INTRAVENOUS
  Filled 2014-08-24: qty 4

## 2014-08-24 MED ORDER — METOPROLOL TARTRATE 25 MG PO TABS
25.0000 mg | ORAL_TABLET | Freq: Three times a day (TID) | ORAL | Status: DC
  Administered 2014-08-24 (×2): 25 mg via ORAL
  Filled 2014-08-24 (×2): qty 1

## 2014-08-24 MED ORDER — VECURONIUM BROMIDE 10 MG IV SOLR
10.0000 mg | INTRAVENOUS | Status: DC | PRN
  Administered 2014-08-24: 10 mg via INTRAVENOUS

## 2014-08-24 MED ORDER — PUREFLOW DIALYSIS SOLUTION
INTRAVENOUS | Status: DC
  Administered 2014-08-24 – 2014-08-25 (×2): via INTRAVENOUS_CENTRAL

## 2014-08-24 MED ORDER — STERILE WATER FOR INJECTION IJ SOLN
10.0000 mL | Freq: Once | INTRAMUSCULAR | Status: AC
  Administered 2014-08-24: 10 mL via INTRAMUSCULAR

## 2014-08-24 NOTE — Progress Notes (Signed)
Dr Jabier Mutton notified of increasing Potasium, orders noted to change 4 K bath to 2 K  and check lab every 4 hours

## 2014-08-24 NOTE — Progress Notes (Signed)
[facility]  ROUNDING NOTE   Subjective:  Pt seen at bedside.  Remains critically ill.  Appears worse this AM. Fio2 up to 100%. CRRT progressing reasonably well.    Objective:  Vital signs in last 24 hours:  Temp:  [94.8 F (34.9 C)-100.2 F (37.9 C)] 99.9 F (37.7 C) (08/17 0700) Pulse Rate:  [32-142] 108 (08/17 0700) Resp:  [8-29] 20 (08/17 0700) BP: (55-155)/(44-85) 117/64 mmHg (08/17 0700) SpO2:  [85 %-100 %] 100 % (08/17 0700) FiO2 (%):  [75 %-100 %] 100 % (08/17 0352) Weight:  [79.2 kg (174 lb 9.7 oz)-82.6 kg (182 lb 1.6 oz)] 82.6 kg (182 lb 1.6 oz) (08/17 0500)  Weight change: 0.5 kg (1 lb 1.6 oz) Filed Weights   08/23/14 0500 08/23/14 1245 08/24/14 0500  Weight: 78.7 kg (173 lb 8 oz) 79.2 kg (174 lb 9.7 oz) 82.6 kg (182 lb 1.6 oz)    Intake/Output: I/O last 3 completed shifts: In: 3532.2 [I.V.:2211.7; NG/GT:1010.5; IV Piggyback:310] Out: 387 [Urine:387]   Intake/Output this shift:     Physical Exam: General: Critically ill appearing  Head: Normocephalic, atraumatic. ETT in place  Eyes: Eyes closed  Neck: Supple, trachea midline  Lungs:  Bilateral rhonchi, vent assisted, 100% fio2.  Heart: S1S2 no rubs  Abdomen:  Soft, nontender, mild distension  Extremities:  1+ generalized edema  Neurologic: Intubated, sedated  Skin: No lesions  Access: Right fermoral temp cath    Basic Metabolic Panel:  Recent Labs Lab 08/20/14 1037  08/22/14 0426 08/23/14 0515 08/23/14 1007 08/23/14 1430 08/23/14 2354 08/24/14 0444  NA  --   < > 130* 128*  --  132* 134* 136  K  --   < > 4.5 5.0  --  4.5 5.0 4.9  CL  --   < > 107 103  --  106 103 106  CO2  --   < > 20* 17*  --  20* 24 26  GLUCOSE  --   < > 178* 313*  --  104* 172* 169*  BUN  --   < > 23* 37*  --  42* 33* 29*  CREATININE  --   < > 2.05* 2.89*  --  2.59* 1.88* 1.54*  CALCIUM  --   < > 5.7* 6.0*  --  6.2* 6.8* 6.8*  MG 2.2  --  1.8  --  2.1 2.1 1.9  --   PHOS  --   --  2.1*  --   --  4.0 4.0   --   < > = values in this interval not displayed.  Liver Function Tests:  Recent Labs Lab 08/11/2014 2025 08/20/14 0137 08/20/14 1636 08/22/14 0426 08/23/14 1430 08/23/14 2354  AST 30 38 60*  --   --   --   ALT 14* 14* 17  --   --   --   ALKPHOS 118 99 97  --   --   --   BILITOT 0.4 <0.1* 0.4  --   --   --   PROT 5.8* 5.0* 5.4*  --   --   --   ALBUMIN 3.0* 2.6* 2.7* 2.0* 2.0* 2.0*   No results for input(s): LIPASE, AMYLASE in the last 168 hours. No results for input(s): AMMONIA in the last 168 hours.  CBC:  Recent Labs Lab 08/31/2014 2025  08/21/14 0028 08/22/14 0426 08/22/14 1749 08/23/14 0515 08/23/14 1847 08/24/14 0444  WBC 32.9*  < > 21.8* 21.1* 25.0* 32.6*  --  49.0*  NEUTROABS 32.6*  --   --   --   --   --   --   --   HGB 9.3*  < > 7.5* 7.7* 7.6* 8.7* 7.7* 7.6*  HCT 29.3*  < > 23.7* 24.2* 25.0* 28.0*  --  24.4*  MCV 83.2  < > 82.6 82.7 82.9 82.7  --  81.7  PLT 196  < > 145* 129* 134* 231  --  240  < > = values in this interval not displayed.  Cardiac Enzymes:  Recent Labs Lab 08/13/2014 2025 08/20/14 0003 08/20/14 0510 08/20/14 1037  TROPONINI 0.08* 1.03* 4.15* 6.44*    BNP: Invalid input(s): POCBNP  CBG:  Recent Labs Lab 08/24/14 0128 08/24/14 0233 08/24/14 0440 08/24/14 0548 08/24/14 0653  GLUCAP 145* 185* 160* 145* 149*    Microbiology: Results for orders placed or performed during the hospital encounter of 08/24/2014  MRSA PCR Screening     Status: None   Collection Time: 08/22/2014  6:02 PM  Result Value Ref Range Status   MRSA by PCR NEGATIVE NEGATIVE Final    Comment:        The GeneXpert MRSA Assay (FDA approved for NASAL specimens only), is one component of a comprehensive MRSA colonization surveillance program. It is not intended to diagnose MRSA infection nor to guide or monitor treatment for MRSA infections.   Blood Culture (routine x 2)     Status: None (Preliminary result)   Collection Time: 08/08/2014  7:15 PM  Result  Value Ref Range Status   Specimen Description BLOOD RIGHT ARM  Final   Special Requests BOTTLES DRAWN AEROBIC AND ANAEROBIC 4CC  Final   Culture NO GROWTH 4 DAYS  Final   Report Status PENDING  Incomplete  Blood Culture (routine x 2)     Status: None (Preliminary result)   Collection Time: 08/15/2014  7:25 PM  Result Value Ref Range Status   Specimen Description BLOOD LEFT ASSIST CONTROL  Final   Special Requests BOTTLES DRAWN AEROBIC AND ANAEROBIC 4CC  Final   Culture NO GROWTH 4 DAYS  Final   Report Status PENDING  Incomplete  Urine culture     Status: None   Collection Time: 08/20/2014  8:25 PM  Result Value Ref Range Status   Specimen Description URINE, RANDOM  Final   Special Requests NONE  Final   Culture   Final    60,000 COLONIES/ml ESCHERICHIA COLI Results Called to: Janett Billow GREGORY AT 1610 08/22/14 [facility] ESBL-EXTENDED SPECTRUM BETA LACTAMASE-THE ORGANISM IS RESISTANT TO PENICILLINS, CEPHALOSPORINS AND AZTREONAM ACCORDING TO CLSI M100-S15 VOL.[place].    Report Status 08/22/2014 FINAL  Final   Organism ID, Bacteria ESCHERICHIA COLI  Final      Susceptibility   Escherichia coli - MIC*    AMPICILLIN >=32 RESISTANT Resistant     CEFTAZIDIME 4 RESISTANT Resistant     CEFAZOLIN >=64 RESISTANT Resistant     CEFTRIAXONE >=64 RESISTANT Resistant     CIPROFLOXACIN >=4 RESISTANT Resistant     GENTAMICIN <=1 SENSITIVE Sensitive     IMIPENEM <=0.25 SENSITIVE Sensitive     TRIMETH/SULFA <=20 SENSITIVE Sensitive     Extended ESBL POSITIVE Resistant     NITROFURANTOIN Value in next row Sensitive      SENSITIVE<=16    PIP/TAZO Value in next row Sensitive      SENSITIVE<=4    AMPICILLIN/SULBACTAM Value in next row Sensitive      SENSITIVE8    *  60,000 COLONIES/ml ESCHERICHIA COLI  Culture, expectorated sputum-assessment     Status: None   Collection Time: 08/20/14 10:05 AM  Result Value Ref Range Status   Specimen Description EXPECTORATED SPUTUM  Final   Special Requests  Normal  Final   Sputum evaluation THIS SPECIMEN IS ACCEPTABLE FOR SPUTUM CULTURE  Final   Report Status 08/20/2014 FINAL  Final  Culture, respiratory (NON-Expectorated)     Status: None   Collection Time: 08/20/14 10:05 AM  Result Value Ref Range Status   Specimen Description EXPECTORATED SPUTUM  Final   Special Requests Normal Reflexed from [id number]  Final   Gram Stain   Final    MANY WBC SEEN NO ORGANISMS SEEN EXCELLENT SPECIMEN - 90-100% WBCS    Culture   Final    LIGHT GROWTH ESCHERICHIA COLI ESBL-EXTENDED SPECTRUM BETA LACTAMASE-THE ORGANISM IS RESISTANT TO PENICILLINS, CEPHALOSPORINS AND AZTREONAM ACCORDING TO CLSI M100-S15 VOL.[place]. ESBL ECOLI ALREADY REPORTED IN URINE CULTURE 08/22/14 [facility]    Report Status 08/23/2014 FINAL  Final   Organism ID, Bacteria ESCHERICHIA COLI  Final      Susceptibility   Escherichia coli - MIC*    AMPICILLIN >=32 RESISTANT Resistant     CEFAZOLIN >=64 RESISTANT Resistant     CEFTRIAXONE >=64 RESISTANT Resistant     CIPROFLOXACIN >=4 RESISTANT Resistant     GENTAMICIN <=1 SENSITIVE Sensitive     IMIPENEM <=0.25 SENSITIVE Sensitive     NITROFURANTOIN <=16 SENSITIVE Sensitive     TRIMETH/SULFA <=20 SENSITIVE Sensitive     Extended ESBL POSITIVE Resistant     PIP/TAZO Value in next row Resistant      RESISTANT>=64    * LIGHT GROWTH ESCHERICHIA COLI    Coagulation Studies:  Recent Labs  08/21/14 1154  LABPROT 22.2*  INR 1.93    Urinalysis: No results for input(s): COLORURINE, LABSPEC, PHURINE, GLUCOSEU, HGBUR, BILIRUBINUR, KETONESUR, PROTEINUR, UROBILINOGEN, NITRITE, LEUKOCYTESUR in the last 72 hours.  Invalid input(s): APPERANCEUR    Imaging: US Renal  08/23/2014   CLINICAL DATA:  Acute renal failure.  EXAM: RENAL / URINARY TRACT ULTRASOUND COMPLETE  COMPARISON:  Abdominal radiograph dated 08/20/2014.  FINDINGS: Right Kidney:  Length: 10.3 cm. Echogenicity within normal limits. There are 2 hypoechoic circumscribed masses  within the right renal pelvis, the larger of which measures 1.0 by 1.0 by 1.0 cm. No hydronephrosis visualized.  Left Kidney:  Length: 10.2 cm. Echogenicity within normal limits. No mass or hydronephrosis visualized.  Bladder:  Urinary bladder is collapsed around a Foley catheter, and therefore not well visualized.  IMPRESSION: No evidence of hydronephrosis.  Preserved cortical thickness bilaterally.  2 hypoechoic circumscribed masses within the right renal pelvis, which may represent parapelvic cysts. Short-term sonographic follow-up, or cross-sectional imaging follow-up may be considered if found clinically necessary and when clinically feasible.   Electronically Signed   By: Fidela Salisbury M.D.   On: 08/23/2014 13:15   Dg Chest Port 1 View  08/23/2014   CLINICAL DATA:  Ventilated patient, respiratory failure.  EXAM: PORTABLE CHEST - 1 VIEW  COMPARISON:  Portable chest x-ray of August 21, 2014  FINDINGS: The lungs are reasonably well inflated. The left lower lobe remains dense and the hemidiaphragm is less well demonstrated today. Slightly increased density at the right lung base has progressed as well. The heart is top-normal in size. The pulmonary vascularity is not clearly engorged.  The endotracheal tube tip lies approximately 4 cm above the carina. The esophagogastric tube tip projects below  the inferior margin of the image. The right PICC line tip projects over the proximal SVC.  IMPRESSION: There has been slight interval deterioration in the appearance of both lungs with increased pleural fluid layering posteriorly and some worsening of bibasilar atelectasis.   Electronically Signed   By: David  Martinique M.D.   On: 08/23/2014 07:33   Dg Abd Portable 1v  08/23/2014   CLINICAL DATA:  Orogastric tube placement  EXAM: PORTABLE ABDOMEN - 1 VIEW  COMPARISON:  August 20, 2014  FINDINGS: Orogastric tube tip and side port are in the stomach. Visualized bowel gas pattern unremarkable. There is atelectatic  change in the left lung base. There are surgical clips in the upper right abdomen.  IMPRESSION: Tube tip and side port in stomach. Visualized bowel gas pattern unremarkable.   Electronically Signed   By: Lowella Grip III M.D.   On: 08/23/2014 12:19     Medications:   . amiodarone 30 mg/hr (08/24/14 0543)  . dexmedetomidine 1.127 mcg/kg/hr (08/24/14 5102)  . feeding supplement (VITAL AF 1.2 CAL) 1,000 mL (08/24/14 0621)  . fentaNYL infusion INTRAVENOUS 250 mcg/hr (08/24/14 0535)  . heparin 600 Units/hr (08/24/14 0101)  . insulin (NOVOLIN-R) infusion 5.5 mL/hr at 08/24/14 0656  . norepinephrine (LEVOPHED) Adult infusion 15 mcg/min (08/24/14 0656)  . pureflow 2,500 mL/hr at 08/23/14 1838   . antiseptic oral rinse  7 mL Mouth Rinse QID  . aspirin  81 mg Oral Daily  . atorvastatin  20 mg Oral QHS  . budesonide  0.5 mg Nebulization BID  . chlorhexidine gluconate  15 mL Mouth Rinse BID  . folic acid  1 mg Oral Daily  . free water  25 mL Per Tube 6 times per day  . imipenem-cilastatin  500 mg Intravenous 3 times per day  . ipratropium-albuterol  3 mL Nebulization Q4H  . methylPREDNISolone (SOLU-MEDROL) injection  20 mg Intravenous Daily  . metoCLOPramide (REGLAN) injection  5 mg Intravenous 3 times per day  . senna-docusate  2 tablet Oral TID  . sodium chloride  3 mL Intravenous Q12H   acetaminophen **OR** acetaminophen, fentaNYL (SUBLIMAZE) injection, heparin, ondansetron **OR** ondansetron (ZOFRAN) IV, sennosides  Assessment/ Plan:  81 y.o. male with a PMHx of prostate cancer, coronary artery disease, hypertension, hyperlipidemia, history of bladder tumor resection, who was admitted to [facility] on 08/24/2014 for evaluation of fever, chills, and cough.  1.  Acute renal failure due to ATN from hypotension/sepsis/congestive heart failure. -Remains critically ill at the moment, oliguria persists, metabolic parameters have improved, continue CRRT at this point in time.  Follow electrolyes,  BMP.   2. Acute respiratory failure. Pneumonia likely contributing to this.  -Remains on the vent at this time.  Continue vent support as well as antibiotic therapy.  Overall respiratory status worse this AM with fio2 of 100%.  Pulm/CC following.   3. Septic shock. Will need to be maintained on pressors at the moment to maintain MAP of 65 or greater.  4. Hyponatremia. Serum Na improved to 136, continue CRRT.  5. Metabolic acidosis. Improved, serum bicarb up to 26 this AM.    6.  Overall prognosis quite guarded, pt is DNR status.     LOS: 5 Neven Fina 8/17/20167:37 AM

## 2014-08-24 NOTE — Consult Note (Signed)
Palliative Medicine Inpatient Consult Note   Name: Marcus Blevins Date: 08/24/2014 MRN: [id number]  DOB: 30-Dec-1927  Referring Physician: Henreitta Leber, MD  Palliative Care consult requested for this 81 y.o. male for goals of medical therapy in patient who is ventilated for acute respiratory failure, sepsis, NSTEMI, and renal failure getting CRRT.     CONVERSATIONS, EVENTS, AND PLANS 1) Pt is DNR status.  I will complete a Armandina Gemma DNR form for the hard chart.  2) He cannot make his own health care decisions being intubated and sedated. 3) I will be meeting with the family at 9 am on 09-12-14 to discuss goals of care when all can meet.   REVIEW OF SYSTEMS:  Patient is not able to provide ROS due to being on the ventilator.   SUPPORT SYSTEM: Family is supportive.  SOCIAL HISTORY:  reports that he quit smoking about 60 years ago. He has never used smokeless tobacco. He reports that he does not drink alcohol or use illicit drugs.  Has a son and daughter and other family members who are coming to visit often.   LEGAL DOCUMENTS:  None found in hard chart  CODE STATUS: DNR  PAST MEDICAL HISTORY: Past Medical History  Diagnosis Date  . Cancer     prostate  . Heart disease   . Prostate cancer 2014    8 weeks of radiation, [facility]  . Hypertension   . Hyperlipidemia     PAST SURGICAL HISTORY:  Past Surgical History  Procedure Laterality Date  . Colonoscopy  2007  . Cholecystectomy  1958  . Bladder scraping  03-15-14    [facility], for bladder cancer  . Tumor removed from bladder      ALLERGIES:  has No Known Allergies.  MEDICATIONS:  Current Facility-Administered Medications  Medication Dose Route Frequency Provider Last Rate Last Dose  . acetaminophen (TYLENOL) tablet 650 mg  650 mg Oral Q6H PRN Lytle Butte, MD   650 mg at 08/22/14 0436   Or  . acetaminophen (TYLENOL) suppository 650 mg  650 mg Rectal Q6H PRN Lytle Butte, MD      . amiodarone (NEXTERONE PREMIX) 360 MG/200ML  (1.8 mg/mL) IV infusion  30 mg/hr Intravenous Continuous Isaias Cowman, MD 16.6 mL/hr at 08/24/14 1300 29.88 mg/hr at 08/24/14 1300  . antiseptic oral rinse solution (CORINZ)  7 mL Mouth Rinse QID Theodoro Grist, MD   7 mL at 08/24/14 1158  . aspirin chewable tablet 81 mg  81 mg Oral Daily Charlett Nose, RPH   81 mg at 08/24/14 0940  . atorvastatin (LIPITOR) tablet 20 mg  20 mg Oral QHS Lytle Butte, MD   20 mg at 08/23/14 2216  . budesonide (PULMICORT) nebulizer solution 0.5 mg  0.5 mg Nebulization BID Lytle Butte, MD   0.5 mg at 08/24/14 0740  . chlorhexidine gluconate (PERIDEX) 0.12 % solution 15 mL  15 mL Mouth Rinse BID Theodoro Grist, MD   15 mL at 08/24/14 0905  . feeding supplement (VITAL AF 1.2 CAL) liquid 1,000 mL  1,000 mL Per Tube Continuous Flora Lipps, MD 57 mL/hr at 08/24/14 1300 1,000 mL at 08/24/14 1300  . fentaNYL (SUBLIMAZE) injection 50 mcg  50 mcg Intravenous Q2H PRN Vilinda Boehringer, MD   50 mcg at 08/24/14 0743  . fentaNYL 2577mcg in NS 254mL (43mcg/ml) infusion-PREMIX  10 mcg/hr Intravenous Continuous Theodoro Grist, MD 25 mL/hr at 08/24/14 1528 250 mcg/hr at 08/24/14 1528  . folic acid (FOLVITE)  tablet 1 mg  1 mg Oral Daily Flora Lipps, MD   1 mg at 08/24/14 0940  . free water 25 mL  25 mL Per Tube 6 times per day Vishal Mungal, MD   25 mL at 08/24/14 1159  . heparin ADULT infusion 100 units/mL (25000 units/250 mL)  600 Units/hr Intravenous Continuous Theodoro Grist, MD 6 mL/hr at 08/24/14 1300 600 Units/hr at 08/24/14 1300  . heparin injection 1,000-6,000 Units  1,000-6,000 Units CRRT PRN Munsoor Lateef, MD      . imipenem-cilastatin (PRIMAXIN) 500 mg in sodium chloride 0.9 % 100 mL IVPB  500 mg Intravenous 3 times per day Charlett Nose, RPH   500 mg at 08/24/14 1541  . insulin regular (NOVOLIN R,HUMULIN R) 250 Units in sodium chloride 0.9 % 250 mL (1 Units/mL) infusion   Intravenous Continuous Theodoro Grist, MD 4 mL/hr at 08/24/14 1527 4 Units/hr at 08/24/14 1527   . ipratropium-albuterol (DUONEB) 0.5-2.5 (3) MG/3ML nebulizer solution 3 mL  3 mL Nebulization Q4H Lytle Butte, MD   3 mL at 08/24/14 1603  . methylPREDNISolone sodium succinate (SOLU-MEDROL) 40 mg/mL injection 20 mg  20 mg Intravenous Daily Flora Lipps, MD   20 mg at 08/24/14 0940  . metoCLOPramide (REGLAN) injection 5 mg  5 mg Intravenous 3 times per day Theodoro Grist, MD   5 mg at 08/24/14 1541  . metoprolol tartrate (LOPRESSOR) tablet 25 mg  25 mg Oral TID Teodoro Spray, MD   25 mg at 08/24/14 1541  . midazolam (VERSED) injection 2-4 mg  2-4 mg Intravenous Q1H PRN Flora Lipps, MD   2 mg at 08/24/14 1136  . norepinephrine (LEVOPHED) 16 mg in dextrose 5 % 250 mL (0.064 mg/mL) infusion  0-40 mcg/min Intravenous Titrated Theodoro Grist, MD 14.1 mL/hr at 08/24/14 1300 15 mcg/min at 08/24/14 1300  . ondansetron (ZOFRAN) tablet 4 mg  4 mg Oral Q6H PRN Lytle Butte, MD       Or  . ondansetron [facility]) injection 4 mg  4 mg Intravenous Q6H PRN Lytle Butte, MD      . pureflow IV solution for Dialysis   CRRT Continuous Munsoor Lateef, MD 2,500 mL/hr at 08/24/14 1130    . senna-docusate (Senokot-S) tablet 2 tablet  2 tablet Oral TID Charlett Nose, [facility]   2 tablet at 08/24/14 0940  . sennosides (SENOKOT) 8.8 MG/5ML syrup 5 mL  5 mL Per Tube QHS PRN Vishal Mungal, MD      . sodium chloride 0.9 % injection 3 mL  3 mL Intravenous Q12H Lytle Butte, MD   3 mL at 08/24/14 0940  . vecuronium (NORCURON) injection 10 mg  10 mg Intravenous Q1H PRN Flora Lipps, MD   10 mg at 08/24/14 1249    Vital Signs: BP 144/78 mmHg  Pulse 132  Temp(Src) 98.1 F (36.7 C) (Rectal)  Resp 20  Ht 5\' 6"  (1.676 m)  Wt 82.6 kg (182 lb 1.6 oz)  BMI 29.41 kg/m2  SpO2 97% Filed Weights   08/23/14 0500 08/23/14 1245 08/24/14 0500  Weight: 78.7 kg (173 lb 8 oz) 79.2 kg (174 lb 9.7 oz) 82.6 kg (182 lb 1.6 oz)    Estimated body mass index is 29.41 kg/(m^2) as calculated from the following:   Height as of this encounter:  5\' 6"  (1.676 m).   Weight as of this encounter: 82.6 kg (182 lb 1.6 oz).  PERFORMANCE STATUS (ECOG) : 4 - Bedbound  PHYSICAL EXAM:  Sedated and Intubated Pupils are equal but unresponsive and near pinpoint Heart irreg irreg Lungs with ronchi and vent sounds Abd soft with scant BS   LABS: CBC:    Component Value Date/Time   WBC 49.0* 08/24/2014 0444   HGB 7.6* 08/24/2014 0444   HCT 24.4* 08/24/2014 0444   PLT 240 08/24/2014 0444   MCV 81.7 08/24/2014 0444   NEUTROABS 32.6* 09/02/2014 2025   LYMPHSABS 0.3* 08/18/2014 2025   MONOABS 0.0* 08/28/2014 2025   EOSABS 0.0 09/06/2014 2025   BASOSABS 0.0 08/16/2014 2025   Comprehensive Metabolic Panel:    Component Value Date/Time   NA 131* 08/24/2014 1209   K 4.7 08/24/2014 1209   CL 101 08/24/2014 1209   CO2 24 08/24/2014 1209   BUN 27* 08/24/2014 1209   CREATININE 1.21 08/24/2014 1209   GLUCOSE 187* 08/24/2014 1209   CALCIUM 6.8* 08/24/2014 1209   AST 60* 08/20/2014 1636   ALT 17 08/20/2014 1636   ALKPHOS 97 08/20/2014 1636   BILITOT 0.4 08/20/2014 1636   PROT 5.4* 08/20/2014 1636   ALBUMIN 1.9* 08/24/2014 1209    IMPRESSION: Multi-organ system failure With CAD, AFIB, ischemic cardiomyopathy Sepsis with ecoli in urine cx and SPUTUm Acute Resp Failure, effusions, on vent Neurologic failure with decrease neuro fxn on lowest sedation Acute Kidney Injury and failure with oliguria on CRRT Metastatic Prostate CA s/p chemo and XRT recently  Blood in stool ? Ischemic bowel Anemia DM2  Hyponatremia Severe protein calorie malnutrition (now tube fed) Extreme leukocytosis     See Above for Plan  REFERRALS TO BE ORDERED:  None as yet.    More than 50% of the visit was spent in counseling/coordination of care: Yes  Time Spent:  70 minutes

## 2014-08-24 NOTE — Progress Notes (Signed)
PULMONARY / CRITICAL CARE MEDICINE   Name: Marcus Blevins MRN: [id number] DOB: 04/25/1927    ADMISSION DATE:  08/13/2014 CONSULTATION DATE: 08/20/14  REFERRING MD : Dr. Theodoro Grist [facility] - [facility]   CHIEF COMPLAINT:   Follow up  Fever, chills and cough resp failure    SIGNIFICANT EVENTS   Patient intubated and sedated,  fio2 increased to 100%, CXR is worsening, Kidney function worsening minimal UO,  Started on CRRT yesterday,  patient remains critically ill, EF 30%  Ecoli in sputum culture and ESBL in urine culture      PAST MEDICAL HISTORY    :  Past Medical History  Diagnosis Date  . Cancer     prostate  . Heart disease   . Prostate cancer 2014    8 weeks of radiation, [facility]  . Hypertension   . Hyperlipidemia    Past Surgical History  Procedure Laterality Date  . Colonoscopy  2007  . Cholecystectomy  1958  . Bladder scraping  03-15-14    [facility], for bladder cancer  . Tumor removed from bladder     Prior to Admission medications   Medication Sig Start Date End Date Taking? Authorizing Provider  albuterol (PROVENTIL HFA;VENTOLIN HFA) 108 (90 BASE) MCG/ACT inhaler Inhale 2 puffs into the lungs every 6 (six) hours as needed for wheezing. 08/03/14  Yes Birdie Sons, MD  amLODipine (NORVASC) 5 MG tablet Take 5 mg by mouth daily.   Yes Historical Provider, MD  aspirin EC 81 MG tablet Take 81 mg by mouth daily.   Yes Historical Provider, MD  atenolol (TENORMIN) 50 MG tablet Take 50 mg by mouth daily.   Yes Historical Provider, MD  atorvastatin (LIPITOR) 20 MG tablet Take 20 mg by mouth at bedtime.   Yes Historical Provider, MD  Calcium Carbonate-Vitamin D (CALCIUM 600+D) 600-400 MG-UNIT per tablet Take 1 tablet by mouth daily.   Yes Historical Provider, MD  clotrimazole-betamethasone (LOTRISONE) cream Apply 1 application topically 2 (two) times daily.   Yes Historical Provider, MD  dexamethasone (DECADRON) 4 MG tablet Take 8 mg by mouth 2  (two) times daily. Pt only takes the day before and day after DOCEtaxel.   Yes Historical Provider, MD  Diphenhyd-Hydrocort-Nystatin (FIRST-DUKES MOUTHWASH) SUSP Use as directed 30 mLs in the mouth or throat as needed (for mouth sores).   Yes Historical Provider, MD  diphenhydramine-acetaminophen (TYLENOL PM) 25-500 MG TABS Take 1 tablet by mouth at bedtime as needed (for sleep).   Yes Historical Provider, MD  fluticasone (FLONASE) 50 MCG/ACT nasal spray Place 1 spray into both nostrils daily.   Yes Historical Provider, MD  folic acid (FOLVITE) 101 MCG tablet Take 800 mcg by mouth daily.   Yes Historical Provider, MD  leuprolide, 6 Month, (ELIGARD) 45 MG injection Inject 45 mg into the skin every 6 (six) months.   Yes Historical Provider, MD  Melatonin 5 MG TABS Take 1 tablet by mouth at bedtime as needed (for sleep).    Yes Historical Provider, MD  mometasone (ASMANEX) 220 MCG/INH inhaler Inhale 1 puff into the lungs 2 (two) times daily.   Yes Historical Provider, MD  montelukast (SINGULAIR) 10 MG tablet Take 10 mg by mouth at bedtime.   Yes Historical Provider, MD  prochlorperazine (COMPAZINE) 10 MG tablet Take 10 mg by mouth every 6 (six) hours as needed for nausea or vomiting.   Yes Historical Provider, MD  pyridOXINE (VITAMIN B-6) 100 MG tablet Take 100 mg by mouth  daily.   Yes Historical Provider, MD  solifenacin (VESICARE) 10 MG tablet Take 10 mg by mouth at bedtime.   Yes Historical Provider, MD  tamsulosin (FLOMAX) 0.4 MG CAPS capsule Take 0.4 mg by mouth daily.   Yes Historical Provider, MD  temazepam (RESTORIL) 7.5 MG capsule Take 7.5 mg by mouth at bedtime as needed for sleep.   Yes Historical Provider, MD  vitamin B-12 (CYANOCOBALAMIN) 1000 MCG tablet Take 1,000 mcg by mouth daily.   Yes Historical Provider, MD  hyoscyamine (LEVSIN SL) 0.125 MG SL tablet Place 1 tablet (0.125 mg total) under the tongue every 4 (four) hours as needed. Patient not taking: Reported on 08/21/2014 07/20/14    Birdie Sons, MD   No Known Allergies   FAMILY HISTORY   Family History  Problem Relation Age of Onset  . CVA Mother   . Heart attack Father       SOCIAL HISTORY    reports that he quit smoking about 60 years ago. He has never used smokeless tobacco. He reports that he does not drink alcohol or use illicit drugs.  Review of Systems  Unable to perform ROS: critical illness  - unable to obtain - patient intubated and sedated    VITAL SIGNS    Temp:  [94.8 F (34.9 C)-100.2 F (37.9 C)] 98.8 F (37.1 C) (08/17 0930) Pulse Rate:  [32-142] 96 (08/17 0930) Resp:  [8-29] 20 (08/17 0930) BP: (55-155)/(44-80) 93/54 mmHg (08/17 0930) SpO2:  [85 %-100 %] 100 % (08/17 0930) FiO2 (%):  [75 %-100 %] 100 % (08/17 0744) Weight:  [174 lb 9.7 oz (79.2 kg)-182 lb 1.6 oz (82.6 kg)] 182 lb 1.6 oz (82.6 kg) (08/17 0500) HEMODYNAMICS:   VENTILATOR SETTINGS: Vent Mode:  [-] PRVC FiO2 (%):  [75 %-100 %] 100 % Set Rate:  [20 bmp] 20 bmp Vt Set:  [530 mL] 530 mL PEEP:  [5 cmH20] 5 cmH20 INTAKE / OUTPUT:  Intake/Output Summary (Last 24 hours) at 08/24/14 1025 Last data filed at 08/24/14 0950  Gross per 24 hour  Intake 1614.42 ml  Output    371 ml  Net 1243.42 ml       PHYSICAL EXAM   Physical Exam GENERAL:sedated on the MV, no acute on the vent.  HEAD: Normocephalic, atraumatic.  EYES: Pupils equal, round, reactive to light. Unable to assess extraocular muscles given mental status/medical condition. No scleral icterus.  MOUTH: Moist mucosal membrane. NECK: Supple. No thyromegaly. No nodules. No JVD.  PULMONARY: Coarse breath sounds with scattered rhonchi right lung field, intubated/mechanical ventilation CARDIOVASCULAR: S1 and S2. Tachycardic No murmurs, rubs, or gallops. No edema. Pedal pulses 2+ bilaterally.  GASTROINTESTINAL: Soft, nontender, nondistended. No masses. Positive bowel sounds. No hepatosplenomegaly.  MUSCULOSKELETAL: No swelling, clubbing, or edema.   NEUROLOGIC:  GCs<8T SKIN: No ulceration, lesions, rashes, or cyanosis. Skin warm and dry.     LABS   LABS:  CBC  Recent Labs Lab 08/22/14 1749 08/23/14 0515 08/23/14 1847 08/24/14 0444  WBC 25.0* 32.6*  --  49.0*  HGB 7.6* 8.7* 7.7* 7.6*  HCT 25.0* 28.0*  --  24.4*  PLT 134* 231  --  240   Coag's  Recent Labs Lab 08/20/14 0137 08/21/14 1154  08/22/14 1749 08/23/14 1007 08/23/14 2354  APTT 36 51*  < > >200* 40* 37*  INR 1.20 1.93  --   --   --   --   < > = values in this interval not displayed. BMET  Recent Labs Lab 08/23/14 2035 08/23/14 2354 08/24/14 0444  NA 136 134* 136  K 4.9 5.0 4.9  CL 105 103 106  CO2 22 24 26   BUN 34* 33* 29*  CREATININE 1.99* 1.88* 1.54*  GLUCOSE 130* 172* 169*   Electrolytes  Recent Labs Lab 08/23/14 1007 08/23/14 1430 08/23/14 2035 08/23/14 2354 08/24/14 0444  CALCIUM  --  6.2* 6.8* 6.8* 6.8*  MG 2.1 2.1  --  1.9  --   PHOS  --  4.0 4.3 4.0  --    Sepsis Markers  Recent Labs Lab 08/20/14 0003 08/20/14 1534 08/21/14 0028  LATICACIDVEN 2.2* 1.5 1.6   ABG  Recent Labs Lab 08/20/14 1735 08/20/14 1917 08/21/14 0505  PHART 7.19* 7.23* 7.30*  PCO2ART 51* 45 37  PO2ART 76* 63* 126*   Liver Enzymes  Recent Labs Lab 08/11/2014 2025 08/20/14 0137 08/20/14 1636  08/23/14 1430 08/23/14 2035 08/23/14 2354  AST 30 38 60*  --   --   --   --   ALT 14* 14* 17  --   --   --   --   ALKPHOS 118 99 97  --   --   --   --   BILITOT 0.4 <0.1* 0.4  --   --   --   --   ALBUMIN 3.0* 2.6* 2.7*  < > 2.0* 1.8* 2.0*  < > = values in this interval not displayed. Cardiac Enzymes  Recent Labs Lab 08/20/14 0003 08/20/14 0510 08/20/14 1037  TROPONINI 1.03* 4.15* 6.44*   Glucose  Recent Labs Lab 08/24/14 0440 08/24/14 0548 08/24/14 0653 08/24/14 0757 08/24/14 0857 08/24/14 0959  GLUCAP 160* 145* 149* 142* 126* 116*     Recent Results (from the past 240 hour(s))  MRSA PCR Screening     Status: None    Collection Time: 08/18/2014  6:02 PM  Result Value Ref Range Status   MRSA by PCR NEGATIVE NEGATIVE Final    Comment:        The GeneXpert MRSA Assay (FDA approved for NASAL specimens only), is one component of a comprehensive MRSA colonization surveillance program. It is not intended to diagnose MRSA infection nor to guide or monitor treatment for MRSA infections.   Blood Culture (routine x 2)     Status: None   Collection Time: 08/17/2014  7:15 PM  Result Value Ref Range Status   Specimen Description BLOOD RIGHT ARM  Final   Special Requests BOTTLES DRAWN AEROBIC AND ANAEROBIC 4CC  Final   Culture NO GROWTH 5 DAYS  Final   Report Status 08/24/2014 FINAL  Final  Blood Culture (routine x 2)     Status: None   Collection Time: 08/22/2014  7:25 PM  Result Value Ref Range Status   Specimen Description BLOOD LEFT ASSIST CONTROL  Final   Special Requests BOTTLES DRAWN AEROBIC AND ANAEROBIC 4CC  Final   Culture NO GROWTH 5 DAYS  Final   Report Status 08/24/2014 FINAL  Final  Urine culture     Status: None   Collection Time: 08/24/2014  8:25 PM  Result Value Ref Range Status   Specimen Description URINE, RANDOM  Final   Special Requests NONE  Final   Culture   Final    60,000 COLONIES/ml ESCHERICHIA COLI Results Called to: Janett Billow GREGORY AT 3300 08/22/14 [facility] ESBL-EXTENDED SPECTRUM BETA LACTAMASE-THE ORGANISM IS RESISTANT TO PENICILLINS, CEPHALOSPORINS AND AZTREONAM ACCORDING TO CLSI M100-S15 VOL.[place].    Report  Status 08/22/2014 FINAL  Final   Organism ID, Bacteria ESCHERICHIA COLI  Final      Susceptibility   Escherichia coli - MIC*    AMPICILLIN >=32 RESISTANT Resistant     CEFTAZIDIME 4 RESISTANT Resistant     CEFAZOLIN >=64 RESISTANT Resistant     CEFTRIAXONE >=64 RESISTANT Resistant     CIPROFLOXACIN >=4 RESISTANT Resistant     GENTAMICIN <=1 SENSITIVE Sensitive     IMIPENEM <=0.25 SENSITIVE Sensitive     TRIMETH/SULFA <=20 SENSITIVE Sensitive     Extended ESBL  POSITIVE Resistant     NITROFURANTOIN Value in next row Sensitive      SENSITIVE<=16    PIP/TAZO Value in next row Sensitive      SENSITIVE<=4    AMPICILLIN/SULBACTAM Value in next row Sensitive      SENSITIVE8    * 60,000 COLONIES/ml ESCHERICHIA COLI  Culture, expectorated sputum-assessment     Status: None   Collection Time: 08/20/14 10:05 AM  Result Value Ref Range Status   Specimen Description EXPECTORATED SPUTUM  Final   Special Requests Normal  Final   Sputum evaluation THIS SPECIMEN IS ACCEPTABLE FOR SPUTUM CULTURE  Final   Report Status 08/20/2014 FINAL  Final  Culture, respiratory (NON-Expectorated)     Status: None   Collection Time: 08/20/14 10:05 AM  Result Value Ref Range Status   Specimen Description EXPECTORATED SPUTUM  Final   Special Requests Normal Reflexed from [id number]  Final   Gram Stain   Final    MANY WBC SEEN NO ORGANISMS SEEN EXCELLENT SPECIMEN - 90-100% WBCS    Culture   Final    LIGHT GROWTH ESCHERICHIA COLI ESBL-EXTENDED SPECTRUM BETA LACTAMASE-THE ORGANISM IS RESISTANT TO PENICILLINS, CEPHALOSPORINS AND AZTREONAM ACCORDING TO CLSI M100-S15 VOL.[place]. ESBL ECOLI ALREADY REPORTED IN URINE CULTURE 08/22/14 [facility]    Report Status 08/23/2014 FINAL  Final   Organism ID, Bacteria ESCHERICHIA COLI  Final      Susceptibility   Escherichia coli - MIC*    AMPICILLIN >=32 RESISTANT Resistant     CEFAZOLIN >=64 RESISTANT Resistant     CEFTRIAXONE >=64 RESISTANT Resistant     CIPROFLOXACIN >=4 RESISTANT Resistant     GENTAMICIN <=1 SENSITIVE Sensitive     IMIPENEM <=0.25 SENSITIVE Sensitive     NITROFURANTOIN <=16 SENSITIVE Sensitive     TRIMETH/SULFA <=20 SENSITIVE Sensitive     Extended ESBL POSITIVE Resistant     PIP/TAZO Value in next row Resistant      RESISTANT>=64    * LIGHT GROWTH ESCHERICHIA COLI     Current facility-administered medications:  .  acetaminophen (TYLENOL) tablet 650 mg, 650 mg, Oral, Q6H PRN, 650 mg at 08/22/14 0436 **OR**  acetaminophen (TYLENOL) suppository 650 mg, 650 mg, Rectal, Q6H PRN, Lytle Butte, MD .  Margrett Rud amiodarone (NEXTERONE) 1.8 mg/mL load via infusion 150 mg, 150 mg, Intravenous, Once, 150 mg at 08/23/14 1745 **FOLLOWED BY** [EXPIRED] amiodarone (NEXTERONE PREMIX) 360 MG/200ML (1.8 mg/mL) IV infusion, 60 mg/hr, Intravenous, Continuous, Last Rate: 33.3 mL/hr at 08/23/14 2323, 60 mg/hr at 08/23/14 2323 **FOLLOWED BY** amiodarone (NEXTERONE PREMIX) 360 MG/200ML (1.8 mg/mL) IV infusion, 30 mg/hr, Intravenous, Continuous, Isaias Cowman, MD, Last Rate: 16.7 mL/hr at 08/24/14 0543, 30 mg/hr at 08/24/14 0543 .  antiseptic oral rinse solution (CORINZ), 7 mL, Mouth Rinse, QID, Theodoro Grist, MD, 7 mL at 08/24/14 0400 .  aspirin chewable tablet 81 mg, 81 mg, Oral, Daily, Charlett Nose, RPH, 81 mg at 08/24/14  0940 .  atorvastatin (LIPITOR) tablet 20 mg, 20 mg, Oral, QHS, Lytle Butte, MD, 20 mg at 08/23/14 2216 .  budesonide (PULMICORT) nebulizer solution 0.5 mg, 0.5 mg, Nebulization, BID, Lytle Butte, MD, 0.5 mg at 08/24/14 0740 .  chlorhexidine gluconate (PERIDEX) 0.12 % solution 15 mL, 15 mL, Mouth Rinse, BID, Theodoro Grist, MD, 15 mL at 08/24/14 0905 .  feeding supplement (VITAL AF 1.2 CAL) liquid 1,000 mL, 1,000 mL, Per Tube, Continuous, Flora Lipps, MD, Last Rate: 57 mL/hr at 08/24/14 0800, 1,000 mL at 08/24/14 0800 .  fentaNYL (SUBLIMAZE) injection 50 mcg, 50 mcg, Intravenous, Q2H PRN, Vilinda Boehringer, MD, 50 mcg at 08/24/14 0743 .  fentaNYL 2527mcg in NS 247mL (13mcg/ml) infusion-PREMIX, 10 mcg/hr, Intravenous, Continuous, Theodoro Grist, MD, Last Rate: 25 mL/hr at 08/24/14 0800, 250 mcg/hr at 08/24/14 0800 .  folic acid (FOLVITE) tablet 1 mg, 1 mg, Oral, Daily, Flora Lipps, MD, 1 mg at 08/24/14 0940 .  free water 25 mL, 25 mL, Per Tube, 6 times per day, Vishal Mungal, MD, 25 mL at 08/24/14 0800 .  heparin ADULT infusion 100 units/mL (25000 units/250 mL), 600 Units/hr, Intravenous, Continuous,  Theodoro Grist, MD, Last Rate: 6 mL/hr at 08/24/14 0800, 600 Units/hr at 08/24/14 0800 .  heparin injection 1,000-6,000 Units, 1,000-6,000 Units, CRRT, PRN, Munsoor Lateef, MD .  imipenem-cilastatin (PRIMAXIN) 500 mg in sodium chloride 0.9 % 100 mL IVPB, 500 mg, Intravenous, 3 times per day, Charlett Nose, RPH, 500 mg at 08/24/14 0534 .  insulin regular (NOVOLIN R,HUMULIN R) 250 Units in sodium chloride 0.9 % 250 mL (1 Units/mL) infusion, , Intravenous, Continuous, Theodoro Grist, MD, Last Rate: 5.1 mL/hr at 08/24/14 0800, 5.1 Units/hr at 08/24/14 0800 .  ipratropium-albuterol (DUONEB) 0.5-2.5 (3) MG/3ML nebulizer solution 3 mL, 3 mL, Nebulization, Q4H, Lytle Butte, MD, 3 mL at 08/24/14 0740 .  methylPREDNISolone sodium succinate (SOLU-MEDROL) 40 mg/mL injection 20 mg, 20 mg, Intravenous, Daily, Flora Lipps, MD, 20 mg at 08/24/14 0940 .  metoCLOPramide (REGLAN) injection 5 mg, 5 mg, Intravenous, 3 times per day, Theodoro Grist, MD, 5 mg at 08/24/14 0535 .  midazolam (VERSED) injection 2-4 mg, 2-4 mg, Intravenous, Q1H PRN, Flora Lipps, MD .  norepinephrine (LEVOPHED) 16 mg in dextrose 5 % 250 mL (0.064 mg/mL) infusion, 0-40 mcg/min, Intravenous, Titrated, Theodoro Grist, MD, Last Rate: 14.1 mL/hr at 08/24/14 0800, 15.04 mcg/min at 08/24/14 0800 .  ondansetron (ZOFRAN) tablet 4 mg, 4 mg, Oral, Q6H PRN **OR** ondansetron (ZOFRAN) injection 4 mg, 4 mg, Intravenous, Q6H PRN, Lytle Butte, MD .  pureflow IV solution for Dialysis, , CRRT, Continuous, Munsoor Lateef, MD, Last Rate: 2,500 mL/hr at 08/23/14 1838 .  senna-docusate (Senokot-S) tablet 2 tablet, 2 tablet, Oral, TID, Charlett Nose, [facility], 2 tablet at 08/24/14 0940 .  sennosides (SENOKOT) 8.8 MG/5ML syrup 5 mL, 5 mL, Per Tube, QHS PRN, Vishal Mungal, MD .  sodium chloride 0.9 % injection 3 mL, 3 mL, Intravenous, Q12H, Lytle Butte, MD, 3 mL at 08/24/14 0940 .  sterile water (preservative free) injection, , , ,   IMAGING    US  Renal  08/23/2014   CLINICAL DATA:  Acute renal failure.  EXAM: RENAL / URINARY TRACT ULTRASOUND COMPLETE  COMPARISON:  Abdominal radiograph dated 08/20/2014.  FINDINGS: Right Kidney:  Length: 10.3 cm. Echogenicity within normal limits. There are 2 hypoechoic circumscribed masses within the right renal pelvis, the larger of which measures 1.0 by 1.0 by 1.0 cm. No  hydronephrosis visualized.  Left Kidney:  Length: 10.2 cm. Echogenicity within normal limits. No mass or hydronephrosis visualized.  Bladder:  Urinary bladder is collapsed around a Foley catheter, and therefore not well visualized.  IMPRESSION: No evidence of hydronephrosis.  Preserved cortical thickness bilaterally.  2 hypoechoic circumscribed masses within the right renal pelvis, which may represent parapelvic cysts. Short-term sonographic follow-up, or cross-sectional imaging follow-up may be considered if found clinically necessary and when clinically feasible.   Electronically Signed   By: Fidela Salisbury M.D.   On: 08/23/2014 13:15   Dg Abd Portable 1v  08/23/2014   CLINICAL DATA:  Orogastric tube placement  EXAM: PORTABLE ABDOMEN - 1 VIEW  COMPARISON:  August 20, 2014  FINDINGS: Orogastric tube tip and side port are in the stomach. Visualized bowel gas pattern unremarkable. There is atelectatic change in the left lung base. There are surgical clips in the upper right abdomen.  IMPRESSION: Tube tip and side port in stomach. Visualized bowel gas pattern unremarkable.   Electronically Signed   By: Lowella Grip III M.D.   On: 08/23/2014 12:19     A\P  81 yo male admitted for sepsis, respiratory failure, intubated, past medical history of prostate cancer currently receiving chemotherapy Patient with acute pneumonia with COPD exacerbation with CHF exacerbation with ESBL UTI  PULMONARY  A: Acute respiratory failure-COPD exacerbation Pulmonary edema Respiratory failure requiring mechanical ventilation Ventilator dyssynchrony P:   Continue with mechanical ventilation, wean as tolerated, not ready for vent weaning today Sedation/analgesia-as needed Follow-up respiratory cultures - light chain GNR - on Vanc\Zosyn\Azithro -start steroids Increase PEEP to 12 and decrease fio2 as tolerared  CARDIOVASCULAR CVL - Right PICC ([facility]) 8/13 RT femoral Vasc Cath 8/16 A:  Elevated troponin Non-STEMI P:  Follow-up cardiac enzymes Continue with heparin drip Follow Cardiology consultation ECHO 8/13 - EF 30-35%  RENAL A:  Electrolyte abnormalities - hypokalemia, hypocalcemia  P:  Worsening renal function, on CRRT Follow up with Nephrology  GASTROINTESTINAL P:  Continue with PPI for stress ulcer prophylaxis Dietary consult Continue tube feeds  HEMATOLOGIC A:  Leukocytosis Sepsis P:  Elevated white blood count possibly due to sepsis versus leukemoid reaction Continue to monitor CBC Continue with current antibiotics   INFECTIOUS A: Sepsis P:  BCx2 8/12>>NTD UC 8/12>>ESBL Sputum 8/12>>light chain GNR-E coli Abx: Vanc/Zosyn, start date 8/12,  Primaxin for 10-14 days   NEUROLOGIC P: Intubated and sedated RASS goal: -1 to -2     I have personally obtained a history, examined the patient, evaluated Pertinent laboratory and RadioGraphic/imaging results, and  formulated the assessment and plan   The Patient requires high complexity decision making for assessment and support, frequent evaluation and titration of therapies, application of advanced monitoring technologies and extensive interpretation of multiple databases. Critical Care Time devoted to patient care services described in this note is 45  minutes.   Overall, patient is critically ill, prognosis is guarded.  Patient with Multiorgan failure and at high risk for cardiac arrest and death. Prognosis is very poor  Will obtain Palliative care consult  Corrin Parker, M.D.  [facility]   Medical Director [place] Director [place] Department       08/24/2014, 10:25 AM

## 2014-08-24 NOTE — Progress Notes (Signed)
MD aware of repeat HGb 7.7. No new orders noted

## 2014-08-24 NOTE — Progress Notes (Signed)
Nutrition Follow-up    INTERVENTION:   EN: recommend continuing TF at current goal rate of 57 ml/hr, free water 25 ml q 4 hours. Continue to assess   NUTRITION DIAGNOSIS:   Inadequate oral intake related to acute illness as evidenced by NPO status. Being addressed via TF  GOAL:   Provide needs based on ASPEN/SCCM guidelines  MONITOR:    (Energy Intake, EN, Digestive System, Anthropometrics, Electrolyte/Renal profile, Glucose Profile)  ASSESSMENT:    Pt remains sedated on vent, on CRRT with goal UF 50 ml/hr, hyponatremia improved, calcium improved, abdominal xray yesterday with OG in place, no acute process. On levophed (15 mcg/min)  Diet Order:  Diet NPO time specified Except for: Sips with Meds   EN: tolerating Vital 1.2 at rate of 57 ml/hr, started back yesterday afternoon  Digestive System: no signs of TF intolerance, residuals 5-350 mL; +reddish brown BM yesterday, Hgb stable  Skin:  Reviewed, no issues  Last BM:  8/16   Electrolyte and Renal Profile:  Recent Labs Lab 08/23/14 1007 08/23/14 1430 08/23/14 2035 08/23/14 2354 08/24/14 0444  BUN  --  42* 34* 33* 29*  CREATININE  --  2.59* 1.99* 1.88* 1.54*  NA  --  132* 136 134* 136  K  --  4.5 4.9 5.0 4.9  MG 2.1 2.1  --  1.9  --   PHOS  --  4.0 4.3 4.0  --   Corrected calcium: 8.4  Glucose Profile:  Recent Labs  08/24/14 0653 08/24/14 0757 08/24/14 0857  GLUCAP 149* 142* 126*   Protein Profile:  Recent Labs Lab 08/23/14 1430 08/23/14 2035 08/23/14 2354  ALBUMIN 2.0* 1.8* 2.0*   Meds: received dulcoax suppository yesterday, insulin drip, levophed, solumedrol, reglan  Height:   Ht Readings from Last 1 Encounters:  08/20/14 5\' 6"  (1.676 m)    Weight:   Wt Readings from Last 1 Encounters:  08/24/14 182 lb 1.6 oz (82.6 kg)   Filed Weights   08/23/14 0500 08/23/14 1245 08/24/14 0500  Weight: 173 lb 8 oz (78.7 kg) 174 lb 9.7 oz (79.2 kg) 182 lb 1.6 oz (82.6 kg)    BMI:  Body mass index  is 29.41 kg/(m^2).  Estimated Nutritional Needs:   Kcal:  1620 kcals (Ve: 9, Tmax: 37.8) using current wt of 65.3 kg  Protein:  98-130 g (1.5-2.0 g/kg)   Fluid:  1625-1950 mL (25-30  ml/kg)   HIGH Care Level  Kerman Passey MS, RD, LDN [phone] Pager

## 2014-08-24 NOTE — Progress Notes (Deleted)
   08/24/14 1400  Clinical Encounter Type  Visited With Patient and family together  Visit Type Follow-up;Spiritual support  Referral From Nurse  Consult/Referral To Chaplain  Spiritual Encounters  Spiritual Needs Emotional;Grief support  Stress Factors  Patient Stress Factors Health changes  Family Stress Factors Health changes;Loss  Chaplain rounded in the unit and checked in with the staff. The nurse informed me about extubation. Spoke with family members and offered support as applicable.  Niece informed me that patient will be extubated around 8pm. Will refer to on-call Chaplain Tiffany. Chaplain Donne Robillard A. Daneli Butkiewicz Ext. [phone] Ext. [phone]

## 2014-08-24 NOTE — Progress Notes (Signed)
[place] [facility] [place] NAME: Marcus Blevins    MR#:  [id number]  DATE OF BIRTH:  [birth year]  SUBJECTIVE:  CHIEF COMPLAINT:   Chief Complaint  Patient presents with  . Fever  . Chills  . Cough   Patient critically ill with multiorgan failure. Noted to have septic shock due to ESBL pneumonia, UTI. Started on continuous hemodialysis yesterday. Urine output remains poor. Remains on vasopressors. Renal function is still poor.  REVIEW OF SYSTEMS:    Review of Systems  Unable to perform ROS  patient is intubated and sedated  Nutrition: Tube feeds  Tolerating Diet: Yes Tolerating PT: Await evaluation   DRUG ALLERGIES:  No Known Allergies  VITALS:  Blood pressure 144/78, pulse 132, temperature 98.1 F (36.7 C), temperature source Rectal, resp. rate 20, height 5\' 6"  (1.676 m), weight 82.6 kg (182 lb 1.6 oz), SpO2 97 %.  PHYSICAL EXAMINATION:   Physical Exam  GENERAL:  81 y.o.-year-old patient lying in the bed critically ill-appearing intubated and sedated. EYES: Pupils equal, round, reactive to light. No scleral icterus.  HEENT: Head atraumatic, normocephalic. ET tube and OG tubes in place. Dry oral mucosa. NECK:  Supple, no jugular venous distention. No thyroid enlargement, no tenderness.  LUNGS: Normal breath sounds bilaterally, no wheezing, rales, rhonchi. No use of accessory muscles of respiration.  CARDIOVASCULAR: S1, S2 irregular. No murmurs, rubs, or gallops.  ABDOMEN: Soft, nontender, nondistended. Bowel sounds hypoactive but present. No organomegaly or mass.  EXTREMITIES: No cyanosis, clubbing or +2 pitting edema bilaterally. Positive anasarca.  NEUROLOGIC: Sedated and intubated. PSYCHIATRIC: Sedated and intubated.  SKIN: No obvious rash, lesion, or ulcer.    LABORATORY PANEL:   CBC  Recent Labs Lab 08/24/14 0444  WBC 49.0*  HGB 7.6*  HCT 24.4*  PLT 240    ------------------------------------------------------------------------------------------------------------------  Chemistries   Recent Labs Lab 08/20/14 1636  08/24/14 1209  NA 136  < > 131*  K 4.4  < > 4.7  CL 108  < > 101  CO2 22  < > 24  GLUCOSE 137*  < > 187*  BUN 19  < > 27*  CREATININE 1.63*  < > 1.21  CALCIUM 6.5*  < > 6.8*  MG  --   < > 1.8  AST 60*  --   --   ALT 17  --   --   ALKPHOS 97  --   --   BILITOT 0.4  --   --   < > = values in this interval not displayed. ------------------------------------------------------------------------------------------------------------------  Cardiac Enzymes  Recent Labs Lab 08/20/14 1037  TROPONINI 6.44*   ------------------------------------------------------------------------------------------------------------------  RADIOLOGY:  US Renal  08/23/2014   CLINICAL DATA:  Acute renal failure.  EXAM: RENAL / URINARY TRACT ULTRASOUND COMPLETE  COMPARISON:  Abdominal radiograph dated 08/20/2014.  FINDINGS: Right Kidney:  Length: 10.3 cm. Echogenicity within normal limits. There are 2 hypoechoic circumscribed masses within the right renal pelvis, the larger of which measures 1.0 by 1.0 by 1.0 cm. No hydronephrosis visualized.  Left Kidney:  Length: 10.2 cm. Echogenicity within normal limits. No mass or hydronephrosis visualized.  Bladder:  Urinary bladder is collapsed around a Foley catheter, and therefore not well visualized.  IMPRESSION: No evidence of hydronephrosis.  Preserved cortical thickness bilaterally.  2 hypoechoic circumscribed masses within the right renal pelvis, which may represent parapelvic cysts. Short-term sonographic follow-up, or cross-sectional imaging follow-up may be considered if found clinically necessary and when clinically feasible.  Electronically Signed   By: Fidela Salisbury M.D.   On: 08/23/2014 13:15   Dg Chest Port 1 View  08/23/2014   CLINICAL DATA:  Ventilated patient, respiratory failure.   EXAM: PORTABLE CHEST - 1 VIEW  COMPARISON:  Portable chest x-ray of August 21, 2014  FINDINGS: The lungs are reasonably well inflated. The left lower lobe remains dense and the hemidiaphragm is less well demonstrated today. Slightly increased density at the right lung base has progressed as well. The heart is top-normal in size. The pulmonary vascularity is not clearly engorged.  The endotracheal tube tip lies approximately 4 cm above the carina. The esophagogastric tube tip projects below the inferior margin of the image. The right PICC line tip projects over the proximal SVC.  IMPRESSION: There has been slight interval deterioration in the appearance of both lungs with increased pleural fluid layering posteriorly and some worsening of bibasilar atelectasis.   Electronically Signed   By: David  Martinique M.D.   On: 08/23/2014 07:33   Dg Abd Portable 1v  08/23/2014   CLINICAL DATA:  Orogastric tube placement  EXAM: PORTABLE ABDOMEN - 1 VIEW  COMPARISON:  August 20, 2014  FINDINGS: Orogastric tube tip and side port are in the stomach. Visualized bowel gas pattern unremarkable. There is atelectatic change in the left lung base. There are surgical clips in the upper right abdomen.  IMPRESSION: Tube tip and side port in stomach. Visualized bowel gas pattern unremarkable.   Electronically Signed   By: Lowella Grip III M.D.   On: 08/23/2014 12:19     ASSESSMENT AND PLAN:   81 year old male with history of metastatic prostate cancer, hypertension, hyperlipidemia, history of heart disease, who presented to the hospital with fever and cough and noted to be in acute respiratory failure and septic shock.  #1 septic shock-this is likely the cause of patient's hemodynamic compromise. -Likely source is pneumonia/UTI. Patient's blood cultures and urine cultures are positive for ESBL. -Continue imipenem. Continue Levophed and wean as tolerated to keep map > 55.  #2 acute respiratory failure-this is likely secondary  to pneumonia. -Continue vent support as per pulmonary. Continue IV steroids. -Continue IV antibiotics for the pneumonia as mentioned above. Wean as tolerated per pulmonary.  #3 acute renal failure-this is likely secondary to ATN, sepsis. -Patient is hemodynamically unstable and therefore has been seen by nephrology and started on CRRT and continue care as per them. Patient continues to remain oliguric.  #4 atrial fibrillation with RVR-this is likely in the setting of sepsis, electrolyte shift from CRRT. -Appreciate cardiology input, continue amiodarone drip -Continue metoprolol if tolerated given the relative hypotension  #5 metastatic prostate cancer-patient is followed by oncology at [facility].  #6 metabolic acidosis-this is likely secondary to sepsis with lactic acidosis and also from the renal failure. -Continue supportive care to treat the underlying infection and also the renal failure and will follow serial ABGs.  Patient has multiorgan failure and given his comorbidities has a very poor prognosis. Palliative care has been consulted to discuss goals of care and plan to have a family meeting tomorrow morning at 9 AM.  All the records are reviewed and case discussed with Care Management/Social Workerr. Management plans discussed with the patient, family and they are in agreement.  CODE STATUS: DO NOT RESUSCITATE  DVT Prophylaxis: Heparin nomogram  TOTAL CRITICAL CARE TIME TAKING CARE OF THIS PATIENT: 35 minutes.   POSSIBLE D/C unclear and depends on patient's hospital course   Henreitta Leber M.D on  08/24/2014 at 5:13 PM  Between 7am to 6pm - Pager - [phone]  After 6pm go to [email or web address] - password [email or web address] [place] [facility]  Office  [phone]  CC: Primary care physician; Lelon Huh, MD

## 2014-08-24 NOTE — Progress Notes (Signed)
Notified Dr. Ubaldo Glassing pts heart rate ranging between 120 to 140's pt afibb/RVR pt currently has amioderone at 16.7 infusing per telephone orders placed order for 0.25 mg iv digoxin once

## 2014-08-24 NOTE — Progress Notes (Signed)
Pt remains intubated currently on 95% FiO2 with 12 of PEEP with O2 sats upper 90's; pt currently in afibb with heart rate 120' to 140's post administration of 0.25 mg digoxin notified Dr. Ubaldo Glassing md stated to monitor at this time no further interventions; CRRT remains in place pt tolerating well per Dr Holley Raring ultrafiltration rate currently 50 ml/hr; pts wbc elevated at 49 and hgb 7.6 Dr. Mortimer Fries aware no further orders given at this time stated to monitor; Dr Mortimer Fries also stated heparin drip to remain infusing; marginal urinary output during shift ; pts family updated about plan of care and questions answered; report given to [facility], RN

## 2014-08-24 NOTE — Progress Notes (Signed)
ANTICOAGULATION CONSULT NOTE - Follow Up Consult  Pharmacy Consult for Heparin Indication: chest pain/ACS  No Known Allergies  Patient Measurements: Height: 5\' 6"  (167.6 cm) Weight: 182 lb 1.6 oz (82.6 kg) IBW/kg (Calculated) : 63.8 Heparin Dosing Weight: 72.2 kg  Vital Signs: Temp: 98.1 F (36.7 C) (08/17 1230) BP: 144/78 mmHg (08/17 1300) Pulse Rate: 132 (08/17 1130)  Labs:  Recent Labs  08/22/14 0953 08/22/14 1749 08/23/14 0515 08/23/14 1007  08/23/14 1847  08/23/14 2354 08/24/14 0444 08/24/14 1012 08/24/14 1209 08/24/14 1434  HGB  --  7.6* 8.7*  --   --  7.7*  --   --  7.6*  --   --   --   HCT  --  25.0* 28.0*  --   --   --   --   --  24.4*  --   --   --   PLT  --  134* 231  --   --   --   --   --  240  --   --   --   APTT >200* >200*  --  40*  --   --   --  37*  --   --   --  23*  HEPARINUNFRC >3.60*  --   --  2.12*  --   --   --   --   --   --   --  1.05*  CREATININE  --   --  2.89*  --   < >  --   < > 1.88* 1.54* 1.29* 1.21  --   < > = values in this interval not displayed.  Estimated Creatinine Clearance: 45 mL/min (by C-G formula based on Cr of 1.21).   Medications:  Scheduled:  . antiseptic oral rinse  7 mL Mouth Rinse QID  . aspirin  81 mg Oral Daily  . atorvastatin  20 mg Oral QHS  . budesonide  0.5 mg Nebulization BID  . chlorhexidine gluconate  15 mL Mouth Rinse BID  . folic acid  1 mg Oral Daily  . free water  25 mL Per Tube 6 times per day  . heparin  3,000 Units Intravenous Once  . imipenem-cilastatin  500 mg Intravenous 3 times per day  . ipratropium-albuterol  3 mL Nebulization Q4H  . methylPREDNISolone (SOLU-MEDROL) injection  20 mg Intravenous Daily  . metoCLOPramide (REGLAN) injection  5 mg Intravenous 3 times per day  . metoprolol tartrate  25 mg Oral TID  . senna-docusate  2 tablet Oral TID  . sodium chloride  3 mL Intravenous Q12H   Infusions:  . amiodarone 29.88 mg/hr (08/24/14 1300)  . feeding supplement (VITAL AF 1.2 CAL)  1,000 mL (08/24/14 1300)  . fentaNYL infusion INTRAVENOUS 250 mcg/hr (08/24/14 1528)  . heparin 600 Units/hr (08/24/14 1300)  . insulin (NOVOLIN-R) infusion 4 Units/hr (08/24/14 1527)  . norepinephrine (LEVOPHED) Adult infusion 15 mcg/min (08/24/14 1300)  . pureflow 2,500 mL/hr at 08/24/14 1130   PRN: acetaminophen **OR** acetaminophen, fentaNYL (SUBLIMAZE) injection, heparin, midazolam, ondansetron **OR** ondansetron (ZOFRAN) IV, sennosides, vecuronium  Assessment: Pharmacy consulted to dose heparin for 81 yo male being treated for possible ACS/NSTEMI. Patient previously on apixaban 5mg  BID with last dose being administered with am meds on 8/14. Heparin drip is running at 600 units/hr.   Goal of Therapy:  Heparin level 0.3-0.7 units/ml, aPTT 68-109 Monitor platelets by anticoagulation protocol: Yes   Plan:   APTT 23   Will order 3000 units bolus  from the bag x 1. Will increase heparin to 800 units/hr. Will recheck aPTT in 8 hours. Will continue to monitor via aPTT until anti-Xa levels and aPTT correlate.  Pharmacy will continue to monitor and adjust per consult.    Ramond Dial, Pharm.D. Clinical Pharmacist 08/24/2014,4:39 PM

## 2014-08-24 NOTE — Progress Notes (Signed)
Palliative Care Update  Palliative Care Consult is initiated.  The plan at this point is for me to have a discussion with family at 38 am on 2014-09-03 --UNLESS I am able to informally meet with all involved family before the end of today. The family is in and out and some are here and others are not, so tomorrow morning seems to be the best plan.    See full note to follow.  Kirby Funk, MD

## 2014-08-24 NOTE — Progress Notes (Signed)
Patient AC ventilation, 95% FIO2, sedated with Fentanyl infusion. Atrial fib, rate in better control after 25 mg Metoprolol initiated, but having to titrate Levophed. CVVHD continues, 150 ml uop my shift. Heparin infusion, Amiodarone 16.6 ml/hr, Insulin drip with Glucostabalizer. Family updated by this RN today, scheduled to meet with Dr. Megan Salon in a.m. (09:00) for pallative consult.

## 2014-08-24 NOTE — Progress Notes (Signed)
ANTICOAGULATION CONSULT NOTE - Follow Up Consult  Pharmacy Consult for Heparin Indication: chest pain/ACS  No Known Allergies  Patient Measurements: Height: 5\' 6"  (167.6 cm) Weight: 174 lb 9.7 oz (79.2 kg) IBW/kg (Calculated) : 63.8 Heparin Dosing Weight: 72.2 kg  Vital Signs: Temp: 98.1 F (36.7 C) (08/16 2300) Temp Source: Rectal (08/16 2000) BP: 144/74 mmHg (08/16 2300) Pulse Rate: 94 (08/16 2300)  Labs:  Recent Labs  08/21/14 1154  08/21/14 2050  08/22/14 0426 08/22/14 0953 08/22/14 1749 08/23/14 0515 08/23/14 1007 08/23/14 1430 08/23/14 1847 08/23/14 2354  HGB  --   --   --   < > 7.7*  --  7.6* 8.7*  --   --  7.7*  --   HCT  --   --   --   --  24.2*  --  25.0* 28.0*  --   --   --   --   PLT  --   --   --   --  129*  --  134* 231  --   --   --   --   APTT 51*  --   --   --   --  >200* >200*  --  40*  --   --  37*  LABPROT 22.2*  --   --   --   --   --   --   --   --   --   --   --   INR 1.93  --   --   --   --   --   --   --   --   --   --   --   HEPARINUNFRC  --   --  2.00*  --   --  >3.60*  --   --  2.12*  --   --   --   CREATININE  --   < >  --   --  2.05*  --   --  2.89*  --  2.59*  --   --   < > = values in this interval not displayed.  Estimated Creatinine Clearance: 20.6 mL/min (by C-G formula based on Cr of 2.59).   Medications:  Scheduled:  . antiseptic oral rinse  7 mL Mouth Rinse QID  . aspirin  81 mg Oral Daily  . atorvastatin  20 mg Oral QHS  . budesonide  0.5 mg Nebulization BID  . chlorhexidine gluconate  15 mL Mouth Rinse BID  . folic acid  1 mg Oral Daily  . free water  25 mL Per Tube 6 times per day  . heparin  3,000 Units Intravenous Once  . imipenem-cilastatin  500 mg Intravenous 3 times per day  . ipratropium-albuterol  3 mL Nebulization Q4H  . methylPREDNISolone (SOLU-MEDROL) injection  20 mg Intravenous Daily  . metoCLOPramide (REGLAN) injection  5 mg Intravenous 3 times per day  . senna-docusate  2 tablet Oral TID  . sodium  chloride  3 mL Intravenous Q12H   Infusions:  . amiodarone 30 mg/hr (08/24/14 0002)  . dexmedetomidine 1.152 mcg/kg/hr (08/24/14 0002)  . feeding supplement (VITAL AF 1.2 CAL) 1,000 mL (08/23/14 1313)  . fentaNYL infusion INTRAVENOUS 200 mcg/hr (08/23/14 2100)  . heparin 400 Units/hr (08/23/14 1423)  . insulin (NOVOLIN-R) infusion 4.4 mL/hr at 08/24/14 0002  . norepinephrine (LEVOPHED) Adult infusion 25 mcg/min (08/23/14 2100)  . pureflow 2,500 mL/hr at 08/23/14 1838   PRN: acetaminophen **OR** acetaminophen, fentaNYL (SUBLIMAZE) injection,  heparin, ondansetron **OR** ondansetron (ZOFRAN) IV, sennosides  Assessment: Pharmacy consulted to dose heparin for 81 yo male being treated for possible ACS/NSTEMI. Patient previously on apixaban 5mg  BID with last dose being administered with am meds on 8/14. Heparin drip is running at 200 units/hr.   Goal of Therapy:  Heparin level 0.3-0.7 units/ml, aPTT 68-109 Monitor platelets by anticoagulation protocol: Yes   Plan:   APTT 39.    Will order 3000 units bolus from the bag x 1. Will increase heparin to 600 units/hr. Will recheck aPTT in 8 hours. Will order anti-Xa level with am labs. Will continue to monitor via aPTT until anti-Xa levels and aPTT correlate.  Pharmacy will continue to monitor and adjust per consult.    Laural Benes, Pharm.D. Clinical Pharmacist 08/24/2014,12:35 AM

## 2014-08-25 ENCOUNTER — Inpatient Hospital Stay: Payer: Medicare Other

## 2014-08-25 DIAGNOSIS — A419 Sepsis, unspecified organism: Secondary | ICD-10-CM | POA: Insufficient documentation

## 2014-08-25 DIAGNOSIS — R6521 Severe sepsis with septic shock: Secondary | ICD-10-CM

## 2014-08-25 LAB — MAGNESIUM
Magnesium: 2 mg/dL (ref 1.7–2.4)
Magnesium: 1.8 mg/dL (ref 1.7–2.4)
Magnesium: 1.8 mg/dL (ref 1.7–2.4)

## 2014-08-25 LAB — RENAL FUNCTION PANEL
ALBUMIN: 1.3 g/dL — AB (ref 3.5–5.0)
ALBUMIN: 1.7 g/dL — AB (ref 3.5–5.0)
ANION GAP: 11 (ref 5–15)
ANION GAP: 9 (ref 5–15)
Albumin: 1.8 g/dL — ABNORMAL LOW (ref 3.5–5.0)
Anion gap: 12 (ref 5–15)
BUN: 25 mg/dL — ABNORMAL HIGH (ref 6–20)
BUN: 26 mg/dL — AB (ref 6–20)
BUN: 26 mg/dL — ABNORMAL HIGH (ref 6–20)
CALCIUM: 6.7 mg/dL — AB (ref 8.9–10.3)
CALCIUM: 7 mg/dL — AB (ref 8.9–10.3)
CHLORIDE: 105 mmol/L (ref 101–111)
CO2: 21 mmol/L — ABNORMAL LOW (ref 22–32)
CO2: 20 mmol/L — ABNORMAL LOW (ref 22–32)
CO2: 21 mmol/L — AB (ref 22–32)
CREATININE: 1.21 mg/dL (ref 0.61–1.24)
Calcium: 7.2 mg/dL — ABNORMAL LOW (ref 8.9–10.3)
Chloride: 107 mmol/L (ref 101–111)
Chloride: 105 mmol/L (ref 101–111)
Creatinine, Ser: 1.17 mg/dL (ref 0.61–1.24)
Creatinine, Ser: 1.23 mg/dL (ref 0.61–1.24)
GFR calc Af Amer: 60 mL/min — ABNORMAL LOW (ref >60)
GFR calc Af Amer: >60 mL/min (ref >60)
GFR calc non Af Amer: 52 mL/min — ABNORMAL LOW (ref >60)
GFR calc non Af Amer: 53 mL/min — ABNORMAL LOW (ref >60)
GFR calc non Af Amer: 55 mL/min — ABNORMAL LOW (ref >60)
GLUCOSE: 154 mg/dL — AB (ref 65–99)
GLUCOSE: 144 mg/dL — AB (ref 65–99)
GLUCOSE: 176 mg/dL — AB (ref 65–99)
PHOSPHORUS: 3.7 mg/dL (ref 2.5–4.6)
PHOSPHORUS: 3.1 mg/dL (ref 2.5–4.6)
POTASSIUM: 5.1 mmol/L (ref 3.5–5.1)
Phosphorus: 3.5 mg/dL (ref 2.5–4.6)
Potassium: 5.4 mmol/L — ABNORMAL HIGH (ref 3.5–5.1)
Potassium: 4.1 mmol/L (ref 3.5–5.1)
SODIUM: 137 mmol/L (ref 135–145)
SODIUM: 137 mmol/L (ref 135–145)
SODIUM: 137 mmol/L (ref 135–145)

## 2014-08-25 LAB — GLUCOSE, CAPILLARY
GLUCOSE-CAPILLARY: 140 mg/dL — AB (ref 65–99)
GLUCOSE-CAPILLARY: 86 mg/dL (ref 65–99)
GLUCOSE-CAPILLARY: 116 mg/dL — AB (ref 65–99)
GLUCOSE-CAPILLARY: 159 mg/dL — AB (ref 65–99)
Glucose-Capillary: 108 mg/dL — ABNORMAL HIGH (ref 65–99)
Glucose-Capillary: 144 mg/dL — ABNORMAL HIGH (ref 65–99)
Glucose-Capillary: 151 mg/dL — ABNORMAL HIGH (ref 65–99)
Glucose-Capillary: 164 mg/dL — ABNORMAL HIGH (ref 65–99)
Glucose-Capillary: 165 mg/dL — ABNORMAL HIGH (ref 65–99)
Glucose-Capillary: 81 mg/dL (ref 65–99)

## 2014-08-25 LAB — CBC
HCT: 24.4 % — ABNORMAL LOW (ref 40.0–52.0)
HEMOGLOBIN: 7.4 g/dL — AB (ref 13.0–18.0)
MCH: 25.3 pg — ABNORMAL LOW (ref 26.0–34.0)
MCHC: 30.4 g/dL — ABNORMAL LOW (ref 32.0–36.0)
MCV: 83.1 fL (ref 80.0–100.0)
Platelets: 264 10*3/uL (ref 150–440)
RBC: 2.94 MIL/uL — AB (ref 4.40–5.90)
RDW: 19.5 % — ABNORMAL HIGH (ref 11.5–14.5)
WBC: 59.1 10*3/uL (ref 3.8–10.6)

## 2014-08-25 LAB — LACTIC ACID, PLASMA: Lactic Acid, Venous: 5.5 mmol/L (ref 0.5–2.0)

## 2014-08-25 LAB — HEPARIN LEVEL (UNFRACTIONATED): HEPARIN UNFRACTIONATED: 0.65 [IU]/mL (ref 0.30–0.70)

## 2014-08-25 LAB — APTT: APTT: 33 s (ref 24–36)

## 2014-08-25 MED ORDER — SODIUM CHLORIDE 0.9 % IV SOLN
0.0300 [IU]/min | INTRAVENOUS | Status: DC
  Administered 2014-08-25: 0.03 [IU]/min via INTRAVENOUS
  Filled 2014-08-25: qty 2

## 2014-08-25 MED ORDER — SODIUM CHLORIDE 0.9 % IV SOLN
500.0000 mg | Freq: Four times a day (QID) | INTRAVENOUS | Status: DC
  Filled 2014-08-25 (×5): qty 500

## 2014-08-25 MED ORDER — VANCOMYCIN HCL IN DEXTROSE 1-5 GM/200ML-% IV SOLN
1000.0000 mg | INTRAVENOUS | Status: DC
  Administered 2014-08-25: 1000 mg via INTRAVENOUS
  Filled 2014-08-25 (×2): qty 200

## 2014-08-25 MED ORDER — FAMOTIDINE IN NACL 20-0.9 MG/50ML-% IV SOLN
20.0000 mg | INTRAVENOUS | Status: DC
  Filled 2014-08-25 (×2): qty 50

## 2014-08-25 MED ORDER — MORPHINE SULFATE (PF) 4 MG/ML IV SOLN: 4.0000 mg | INTRAVENOUS | Status: DC | PRN

## 2014-08-25 MED ORDER — VANCOMYCIN HCL IN DEXTROSE 1-5 GM/200ML-% IV SOLN
1000.0000 mg | Freq: Once | INTRAVENOUS | Status: DC
  Filled 2014-08-25: qty 200

## 2014-08-25 MED ORDER — LORAZEPAM 2 MG/ML IJ SOLN
1.0000 mg | INTRAMUSCULAR | Status: DC | PRN
  Administered 2014-08-25: 1 mg via INTRAVENOUS
  Filled 2014-08-25: qty 1

## 2014-08-25 MED ORDER — VASOPRESSIN 20 UNIT/ML IV SOLN
0.0300 [IU]/min | INTRAVENOUS | Status: DC
  Filled 2014-08-25: qty 2

## 2014-08-25 MED ORDER — MORPHINE SULFATE (PF) 4 MG/ML IV SOLN
4.0000 mg | INTRAVENOUS | Status: DC | PRN
  Administered 2014-08-25 (×3): 4 mg via INTRAVENOUS
  Filled 2014-08-25: qty 2
  Filled 2014-08-25 (×2): qty 1

## 2014-09-08 NOTE — Progress Notes (Signed)
Inpatient Diabetes Program Recommendations  AACE/ADA: New Consensus Statement on Inpatient Glycemic Control (2013)  Target Ranges:  Prepandial:   less than 140 mg/dL      Peak postprandial:   less than 180 mg/dL (1-2 hours)      Critically ill patients:  140 - 180 mg/dL    Blood sugars stable.  Consider d/c glucose stabilizer and start Novolog custom correction insulin - may want to try;               150-180mg /dl= 0 unit                                                    181-240mg /dl= 1 units                                                     241-300mg /dl=2 units                                                     301-360mg /dl=3 units                                                     361-420mg /dl=4 units   q6h  Gentry Fitz, RN, [place], [place], CDE Diabetes Coordinator Inpatient Diabetes Program  [phone] (Team Pager) [phone] ([place]) 09/11/14 8:54 AM

## 2014-09-08 NOTE — Progress Notes (Signed)
ANTICOAGULATION CONSULT NOTE - Follow Up Consult  Pharmacy Consult for Heparin Indication: chest pain/ACS  No Known Allergies  Patient Measurements: Height: 5\' 6"  (167.6 cm) Weight: 182 lb 1.6 oz (82.6 kg) IBW/kg (Calculated) : 63.8 Heparin Dosing Weight: 72.2 kg  Vital Signs: Temp: 97.6 F (36.4 C) (08/18 0300) Temp Source: Axillary (08/18 0300) BP: 91/70 mmHg (08/18 0300) Pulse Rate: 88 (08/18 0300)  Labs:  Recent Labs  08/22/14 1749 08/23/14 0515 08/23/14 1007  08/23/14 1847  08/23/14 2354 08/24/14 0444  08/24/14 1434  08/24/14 2011 09/03/2014 0018 09/03/14 0242  HGB 7.6* 8.7*  --   --  7.7*  --   --  7.6*  --   --   --   --   --   --   HCT 25.0* 28.0*  --   --   --   --   --  24.4*  --   --   --   --   --   --   PLT 134* 231  --   --   --   --   --  240  --   --   --   --   --   --   APTT >200*  --  40*  --   --   --  37*  --   --  23*  --   --   --  33  HEPARINUNFRC  --   --  2.12*  --   --   --   --   --   --  1.05*  --   --   --  0.65  CREATININE  --  2.89*  --   < >  --   < > 1.88* 1.54*  < >  --   < > 1.18 1.23 1.21  < > = values in this interval not displayed.  Estimated Creatinine Clearance: 45 mL/min (by C-G formula based on Cr of 1.21).   Medications:  Scheduled:  . antiseptic oral rinse  7 mL Mouth Rinse QID  . aspirin  81 mg Oral Daily  . atorvastatin  20 mg Oral QHS  . budesonide  0.5 mg Nebulization BID  . chlorhexidine gluconate  15 mL Mouth Rinse BID  . folic acid  1 mg Oral Daily  . free water  25 mL Per Tube 6 times per day  . imipenem-cilastatin  500 mg Intravenous 3 times per day  . ipratropium-albuterol  3 mL Nebulization Q4H  . methylPREDNISolone (SOLU-MEDROL) injection  20 mg Intravenous Daily  . metoCLOPramide (REGLAN) injection  5 mg Intravenous 3 times per day  . metoprolol tartrate  25 mg Oral TID  . senna-docusate  2 tablet Oral TID  . sodium chloride  3 mL Intravenous Q12H   Infusions:  . amiodarone 29.88 mg/hr (08/24/14  2200)  . feeding supplement (VITAL AF 1.2 CAL) 1,000 mL (09/03/2014 0032)  . fentaNYL infusion INTRAVENOUS 10 mcg/hr (09-03-14 0107)  . heparin 800 Units/hr (08/24/14 2200)  . insulin (NOVOLIN-R) infusion 4 Units/hr (08/24/14 1527)  . norepinephrine (LEVOPHED) Adult infusion 19 mcg/min (08/24/14 2200)  . pureflow 2,500 mL/hr at 08/24/14 2315   PRN: acetaminophen **OR** acetaminophen, fentaNYL (SUBLIMAZE) injection, heparin, midazolam, ondansetron **OR** ondansetron (ZOFRAN) IV, sennosides, vecuronium  Assessment: Pharmacy consulted to dose heparin for 81 yo male being treated for possible ACS/NSTEMI. Patient previously on apixaban 5mg  BID with last dose being administered with am meds on 8/14. Heparin drip is running at 600  units/hr.   Goal of Therapy:  Heparin level 0.3-0.7 units/ml, aPTT 68-109 Monitor platelets by anticoagulation protocol: Yes   Plan:  Heparin level therapeutic 0.65, PTT 33. Continue current dose, will check another HL/aPTT in 8 hours.  Pharmacy will continue to monitor and adjust per consult.    Laural Benes, Pharm.D. Clinical Pharmacist Aug 30, 2014,3:16 AM

## 2014-09-08 NOTE — Discharge Summary (Signed)
This will serve as a death summary the patient Marcus Blevins  Date of Admission - 14-Sep-2014  Date of Death - 09-20-14  Consultants during Hospital Course:  Dr. Patricia Pesa - Pulmonary Critical Care Dr. Anthonette Legato - Nephrology Dr. Jordan Hawks - Cardiology Dr. Rogue Jury - Palliative Care medicine  Hospital course  81 year old male with history of metastatic prostate cancer, hypertension, hyperlipidemia, history of heart disease, who presented to the hospital with fever and cough and noted to be in acute respiratory failure and septic shock.  #1 septic shock-this is likely the cause of patient's hemodynamic compromise. -Likely source is pneumonia/UTI. Patient's blood cultures and urine cultures are positive for ESBL. -Pt. Was maintained on imipenem. Also given IV Levophed  keep map > 55.  #2 acute respiratory failure-this is likely secondary to pneumonia. -Patient was treated with IV steroids, IV antibiotics, vent support  #3 acute renal failure-this is likely secondary to ATN, sepsis. -Patient was hemodynamically unstable and therefore had been seen by nephrology and started on CRRT but remained oliguric.  #4 atrial fibrillation with RVR-this is likely in the setting of sepsis, electrolyte shift from CRRT. -pt. Was seen by cardiology and maintained on a amiodarone drip  #5 metabolic acidosis-this is likely secondary to sepsis with lactic acidosis and also from the renal failure.  Despite getting aggressive care as mentioned above patient's clinical prognosis was poor given his advanced age to and comorbidities. A palliative care consult was obtained to discuss goals of care with the family. Patient was already a DO NOT RESUSCITATE. -After further discussions a palliative care with the family they did not want to pursue further aggressive care as patient was not improving. Patient was made comfort care only and taken off life support on the morning of 09/20/14. Patient  shortly thereafter passed away and the family was made aware.  Time spent with discharge/discharge summary-35 minutes.

## 2014-09-08 NOTE — Progress Notes (Signed)
Pt death at 80 two RN witnessed, pt's family at [facility] with Dr. Megan Salon, nursing will cont to monitor

## 2014-09-08 NOTE — Progress Notes (Signed)
Pt will not track nor make any purposeful movements.  Pt remains afib on the cardiac monitor.  Lungs are diminished, coarse at times.  Pt has hypoactive, faint bowel sounds.  Tube feeds are infusing and pt is tolerating them well.  Foley remains in place with 50cc of urine out during my shift.  All pulses, radial and pedal have to be dopplered.  Pt's insulin drip has been on hold all during the night.  Pt remains on levophed, triple concentrated, at 20mcg/min.  Pt remains on CRRT and is tolerating it well.  Bear hugger had to be placed, temp went to 97 rectal.  Currently it is at 97.5rectal. Pt remains on heparin drip which is therapeutic.  Pt wbc count now at 59.1.  MD is aware but made no new changes.  Per Dr. Reece Levy, pass info on in report.  Pt. Did not have family present during the night.

## 2014-09-08 NOTE — Progress Notes (Signed)
Marcus Blevins  SUBJECTIVE: intubated and on high dose pressors. No response to stimuli.Tele afib with controlled vr at present.    Filed Vitals:   August 27, 2014 0408 08-27-2014 0500 08/27/14 0600 Aug 27, 2014 0730  BP:  103/74 91/65 89/66   Pulse:  97 84 100  Temp:  97 F (36.1 C) 97.3 F (36.3 C) 98.2 F (36.8 C)  TempSrc:  Rectal Rectal   Resp:  15 17 17   Height:      Weight: 81.2 kg (179 lb 0.2 oz)     SpO2: 99% 100% 100% 100%    Intake/Output Summary (Last 24 hours) at 08-27-14 0816 Last data filed at 08/27/14 0600  Gross per 24 hour  Intake 3333.17 ml  Output   1354 ml  Net 1979.17 ml    LABS: Basic Metabolic Panel:  Recent Labs  08/27/2014 0018 08-27-14 0242  NA 137 137  K 5.1 5.4*  CL 105 107  CO2 21* 21*  GLUCOSE 154* 144*  BUN 25* 26*  CREATININE 1.23 1.21  CALCIUM 7.2* 6.7*  MG 2.0 1.8  PHOS 3.5 3.7   Liver Function Tests:  Recent Labs  27-Aug-2014 0018 27-Aug-2014 0242  ALBUMIN 1.8* 1.3*   No results for input(s): LIPASE, AMYLASE in the last 72 hours. CBC:  Recent Labs  08/24/14 0444 08/27/2014 0505  WBC 49.0* 59.1*  HGB 7.6* 7.4*  HCT 24.4* 24.4*  MCV 81.7 83.1  PLT 240 264   Cardiac Enzymes: No results for input(s): CKTOTAL, CKMB, CKMBINDEX, TROPONINI in the last 72 hours. BNP: Invalid input(s): POCBNP D-Dimer: No results for input(s): DDIMER in the last 72 hours. Hemoglobin A1C: No results for input(s): HGBA1C in the last 72 hours. Fasting Lipid Panel: No results for input(s): CHOL, HDL, LDLCALC, TRIG, CHOLHDL, LDLDIRECT in the last 72 hours. Thyroid Function Tests: No results for input(s): TSH, T4TOTAL, T3FREE, THYROIDAB in the last 72 hours.  Invalid input(s): FREET3 Anemia Panel: No results for input(s): VITAMINB12, FOLATE, FERRITIN, TIBC, IRON, RETICCTPCT in the last 72 hours.   Physical Exam: Blood pressure 89/66, pulse 100, temperature 98.2 F (36.8 C), temperature source Rectal, resp. rate 17,  height 5\' 6"  (1.676 m), weight 81.2 kg (179 lb 0.2 oz), SpO2 100 %.    General appearance: unresponsive Resp: diminished breath sounds bilaterally Cardio: regular rate and rhythm GI: abnormal findings:  hypoactive bowel sounds Extremities: edema 2+ Neurologic: Mental status: alertness: unresponsive  TELEMETRY: Reviewed telemetry pt in afib with controlled vr:  ASSESSMENT AND PLAN:  Active Problems:   Severe sepsis-poor prognosis. On high dose pressors. WBC markedly increased at 59.1. H/H 7.4/24.4. Continue with pressors for now.    Acute respiratory failure with hypoxia   A-fib-rate controlled at present on amiodarone , metoprolol and digoxin. Continue for now.    Acute renal failure (ARF)   Acute respiratory failure    Teodoro Spray., MD, [facility] 2014-08-27 8:16 AM

## 2014-09-08 NOTE — Progress Notes (Signed)
   08/24/14 1630  Clinical Encounter Type  Visited With Patient and family together  Visit Type Follow-up  Consult/Referral To Chaplain  Spiritual Encounters  Spiritual Needs Emotional  Stress Factors  Family Stress Factors Health changes  Chaplain checked in with the patient's spouse and offered compassionate presence and support as applicable. Chaplain Miranda Frese A. Barbra Miner (Previously charted incorrectly for patient.)

## 2014-09-08 NOTE — Progress Notes (Signed)
Nutrition Follow-up    INTERVENTION:   EN: continue TF as tolerated if pt remains on vent  NUTRITION DIAGNOSIS:   Inadequate oral intake related to acute illness as evidenced by NPO status. Being addressed via TF  GOAL:   Provide needs based on ASPEN/SCCM guidelines   MONITOR:    (Energy Intake, EN, Digestive System, Anthropometrics, Electrolyte/Renal profile, Glucose Profile)  ASSESSMENT:    Pt remains on vent, on CRRT, changed from 4 K+ to 2 K+ bath during the night due to hyperkalemia, potassium improved, oliguric, WBC contiues to rise, afebrile, levophed at 30 mcg/min, started vasopressin, MAP >65; palliative care meeting with family this AM  Diet Order:  Diet NPO time specified Except for: Sips with Meds  EN: tolerating Vital 1.2 TF at rate of 57 ml/hr  Digestive System: no signs of TF intolerance  Skin:  Reviewed, no issues  Last BM:  8/16   Electrolyte and Renal Profile:  Recent Labs Lab Sep 05, 2014 0018 2014-09-05 0242 09/05/2014 0825  BUN 25* 26* 26*  CREATININE 1.23 1.21 1.17  NA 137 137 137  K 5.1 5.4* 4.1  MG 2.0 1.8 1.8  PHOS 3.5 3.7 3.1  Corrected calcium 8.4  Glucose Profile:  Recent Labs  2014-09-05 0644 09-05-14 0810 2014-09-05 0925  GLUCAP 144* 164* 151*   Protein Profile:  Recent Labs Lab 05-Sep-2014 0018 2014-09-05 0242 05-Sep-2014 0825  ALBUMIN 1.8* 1.3* 1.7*   Nutritional Anemia Profile:  CBC Latest Ref Rng 09/05/14 08/24/2014 08/23/2014  WBC 3.8 - 10.6 K/uL 59.1(HH) 49.0(H) -  Hemoglobin 13.0 - 18.0 g/dL 7.4(L) 7.6(L) 7.7(L)  Hematocrit 40.0 - 52.0 % 24.4(L) 24.4(L) -  Platelets 150 - 440 K/uL 264 240 -    Meds: reglain, insulin drip, senokot, vasopressin, levophed  Height:   Ht Readings from Last 1 Encounters:  08/20/14 5\' 6"  (1.676 m)    Weight:   Wt Readings from Last 1 Encounters:  09-05-14 179 lb 0.2 oz (81.2 kg)    Filed Weights   08/23/14 1245 08/24/14 0500 September 05, 2014 0408  Weight: 174 lb 9.7 oz (79.2 kg) 182 lb 1.6 oz  (82.6 kg) 179 lb 0.2 oz (81.2 kg)    BMI:  Body mass index is 28.91 kg/(m^2).  Estimated Nutritional Needs:   Kcal:  1620 kcals (Ve: 9, Tmax: 37.8) using current wt of 65.3 kg  Protein:  98-130 g (1.5-2.0 g/kg)   Fluid:  1625-1950 mL (25-30  ml/kg)   HIGH Care Level  Kerman Passey MS, RD, LDN [phone] Pager

## 2014-09-08 NOTE — Progress Notes (Signed)
Palliative Medicine Inpatient Consult Follow Up Note   Name: Marcus Blevins Date: 08/15/2014 MRN: [id number]  DOB: 03/26/1927  Referring Physician: Vivek J Sainani, MD  Palliative Care consult requested for this 81 y.o. male for goals of medical therapy in patient with multi-organ system failure due to ESBL e- coli sepsis with acute respiratory failure due to septic shock.   PLAN: I have met with the patient's son, daughter, wife, and a daughter-in-law.  I have confirmed DNR status.  I have been given a copy of pt's HCPOA form and Living Will form which states that pt would not wish to be maintained on life support if terminal.  I reviewed each organ system and the problems and failure in each organ system with family.  Family was aware last evening that he was not responding to very aggressive measures to enable recovery.  He has no immune system to work with the strong ABX for the aggressive e-coli infection in his lungs which likely got there from seeding via leaky blood vessels related to the septic shock reaction.  The e-coli was found in his urine and then seeded in his lungs.  I described the various new developments in addition to the organ systems failing one by one.  Pts scrotum is about to break down along with sacrum (red areas).  Pt would not want to live this way.  Pts wife was in agreement with withdrawal of care, but deferred decisions to pts two adult children, as they are listed as HCPOAs on the form presented to me this am.  PLAN is to withdraw all care at 10:40 am (leaving time for the family to get a pastry etc since some have not eaten and I prefer they not be weak or tremulous during the withdrawal of care).  Family will likely wish to be present.  I have alerted nursing and placed comfort / withdrawal of care orders.     REVIEW OF SYSTEMS:  Patient is not able to provide ROS --due to sepsis with sedation and intubation  CODE STATUS: DNR   PAST MEDICAL HISTORY: Past  Medical History  Diagnosis Date  . Cancer     prostate  . Heart disease   . Prostate cancer 2014    8 weeks of radiation, [facility]  . Hypertension   . Hyperlipidemia     PAST SURGICAL HISTORY:  Past Surgical History  Procedure Laterality Date  . Colonoscopy  2007  . Cholecystectomy  1958  . Bladder scraping  03-15-14    [facility], for bladder cancer  . Tumor removed from bladder      Vital Signs: BP 88/54 mmHg  Pulse 97  Temp(Src) 98.4 F (36.9 C) (Rectal)  Resp 15  Ht 5' 6" (1.676 m)  Wt 81.2 kg (179 lb 0.2 oz)  BMI 28.91 kg/m2  SpO2 100% Filed Weights   08/23/14 1245 08/24/14 0500 08/08/2014 0408  Weight: 79.2 kg (174 lb 9.7 oz) 82.6 kg (182 lb 1.6 oz) 81.2 kg (179 lb 0.2 oz)    Estimated body mass index is 28.91 kg/(m^2) as calculated from the following:   Height as of this encounter: 5' 6" (1.676 m).   Weight as of this encounter: 81.2 kg (179 lb 0.2 oz).  PHYSICAL EXAM: Intubated and sedated Multiple lines --but BP low and running out of access sites Pupils pinpoint and unresponsive He twichted some but not to pain Hrt irreg irreg Lungs with ronchi and vent sounds Abd soft with scant BS' Ext   pale and cool Scrotum is swollen and skin of scrotum is taut   LABS: CBC:    Component Value Date/Time   WBC 59.1* Sep 23, 2014 0505   HGB 7.4* Sep 23, 2014 0505   HCT 24.4* September 23, 2014 0505   PLT 264 2014/09/23 0505   MCV 83.1 September 23, 2014 0505   NEUTROABS 32.6* 09/06/2014 2025   LYMPHSABS 0.3* 08/17/2014 2025   MONOABS 0.0* 08/26/2014 2025   EOSABS 0.0 09/05/2014 2025   BASOSABS 0.0 08/30/2014 2025   Comprehensive Metabolic Panel:    Component Value Date/Time   NA 137 September 23, 2014 0825   K 4.1 September 23, 2014 0825   CL 105 23-Sep-2014 0825   CO2 20* 09-23-2014 0825   BUN 26* 2014/09/23 0825   CREATININE 1.17 23-Sep-2014 0825   GLUCOSE 176* 09-23-2014 0825   CALCIUM 7.0* 2014/09/23 0825   AST 60* 08/20/2014 1636   ALT 17 08/20/2014 1636   ALKPHOS 97 08/20/2014 1636    BILITOT 0.4 08/20/2014 1636   PROT 5.4* 08/20/2014 1636   ALBUMIN 1.7* 23-Sep-2014 0825    IMPRESSION: Multi-organ system failure With CAD, AFIB, ischemic cardiomyopathy Sepsis with ecoli in urine cx and SPUTUm Acute Resp Failure, effusions, on vent Neurologic failure with decrease neuro fxn on lowest sedation Acute Kidney Injury and failure with oliguria on CRRT Metastatic Prostate CA s/p chemo and XRT recently  Blood in stool ? Ischemic bowel Anemia DM2  Hyponatremia Severe protein calorie malnutrition (now tube fed) Extreme leukocytosis   See top of note for plan.  REFERRALS TO BE ORDERED:  None    More than 50% of the visit was spent in counseling/coordination of care: YES  Time Spent: 70 min

## 2014-09-08 NOTE — Progress Notes (Signed)
Death Pronouncement   Patient had withdrawal from vent and all aggressive care at 10:40 am.  He ceased having respirations at about 10:50 am.  His telemetry monitor showed gradual bradycardia and then asystole was noted at 10:56 am.  Pt was examined after his death and he has fixed and dilated pupils, no pulses, no respirations, no heart beat, no lung sounds.  He is pronounced dead at 10:56 am.  Family was bedside and accepting of this inevitable event.    See earlier note for details, but cause of death will likely be listed as being due to sepsis from e-coli related to e-coli UTI.  Contributing factors include multi-organ system failure related to sepsis and also metastatic prostate cancer.     Kirby Funk, MD

## 2014-09-08 NOTE — Progress Notes (Signed)
Comfort care,extubated per MD order without complications

## 2014-09-08 NOTE — Progress Notes (Signed)
PULMONARY / CRITICAL CARE MEDICINE   Name: Marcus Blevins MRN: [id number] DOB: Jul 13, 1927    ADMISSION DATE:  08/14/2014 CONSULTATION DATE: 08/20/14  REFERRING MD : Dr. Theodoro Grist [facility] - [facility]   CHIEF COMPLAINT:   Follow up  Fever, chills and cough, resp failure    SIGNIFICANT EVENTS   Patient intubated and sedated,  fio2 @80 %.CXR is worsening, Kidney function worsening minimal UO,   on CRRT, remains on vasopressors, WBC elevated to 59,   patient remains critically ill, EF 30%  Ecoli in sputum culture and ESBL in urine culture      PAST MEDICAL HISTORY    :  Past Medical History  Diagnosis Date  . Cancer     prostate  . Heart disease   . Prostate cancer 2014    8 weeks of radiation, [facility]  . Hypertension   . Hyperlipidemia    Past Surgical History  Procedure Laterality Date  . Colonoscopy  2007  . Cholecystectomy  1958  . Bladder scraping  03-15-14    [facility], for bladder cancer  . Tumor removed from bladder     Prior to Admission medications   Medication Sig Start Date End Date Taking? Authorizing Provider  albuterol (PROVENTIL HFA;VENTOLIN HFA) 108 (90 BASE) MCG/ACT inhaler Inhale 2 puffs into the lungs every 6 (six) hours as needed for wheezing. 08/03/14  Yes Birdie Sons, MD  amLODipine (NORVASC) 5 MG tablet Take 5 mg by mouth daily.   Yes Historical Provider, MD  aspirin EC 81 MG tablet Take 81 mg by mouth daily.   Yes Historical Provider, MD  atenolol (TENORMIN) 50 MG tablet Take 50 mg by mouth daily.   Yes Historical Provider, MD  atorvastatin (LIPITOR) 20 MG tablet Take 20 mg by mouth at bedtime.   Yes Historical Provider, MD  Calcium Carbonate-Vitamin D (CALCIUM 600+D) 600-400 MG-UNIT per tablet Take 1 tablet by mouth daily.   Yes Historical Provider, MD  clotrimazole-betamethasone (LOTRISONE) cream Apply 1 application topically 2 (two) times daily.   Yes Historical Provider, MD  dexamethasone (DECADRON) 4 MG tablet Take 8  mg by mouth 2 (two) times daily. Pt only takes the day before and day after DOCEtaxel.   Yes Historical Provider, MD  Diphenhyd-Hydrocort-Nystatin (FIRST-DUKES MOUTHWASH) SUSP Use as directed 30 mLs in the mouth or throat as needed (for mouth sores).   Yes Historical Provider, MD  diphenhydramine-acetaminophen (TYLENOL PM) 25-500 MG TABS Take 1 tablet by mouth at bedtime as needed (for sleep).   Yes Historical Provider, MD  fluticasone (FLONASE) 50 MCG/ACT nasal spray Place 1 spray into both nostrils daily.   Yes Historical Provider, MD  folic acid (FOLVITE) 315 MCG tablet Take 800 mcg by mouth daily.   Yes Historical Provider, MD  leuprolide, 6 Month, (ELIGARD) 45 MG injection Inject 45 mg into the skin every 6 (six) months.   Yes Historical Provider, MD  Melatonin 5 MG TABS Take 1 tablet by mouth at bedtime as needed (for sleep).    Yes Historical Provider, MD  mometasone (ASMANEX) 220 MCG/INH inhaler Inhale 1 puff into the lungs 2 (two) times daily.   Yes Historical Provider, MD  montelukast (SINGULAIR) 10 MG tablet Take 10 mg by mouth at bedtime.   Yes Historical Provider, MD  prochlorperazine (COMPAZINE) 10 MG tablet Take 10 mg by mouth every 6 (six) hours as needed for nausea or vomiting.   Yes Historical Provider, MD  pyridOXINE (VITAMIN B-6) 100 MG tablet Take  100 mg by mouth daily.   Yes Historical Provider, MD  solifenacin (VESICARE) 10 MG tablet Take 10 mg by mouth at bedtime.   Yes Historical Provider, MD  tamsulosin (FLOMAX) 0.4 MG CAPS capsule Take 0.4 mg by mouth daily.   Yes Historical Provider, MD  temazepam (RESTORIL) 7.5 MG capsule Take 7.5 mg by mouth at bedtime as needed for sleep.   Yes Historical Provider, MD  vitamin B-12 (CYANOCOBALAMIN) 1000 MCG tablet Take 1,000 mcg by mouth daily.   Yes Historical Provider, MD  hyoscyamine (LEVSIN SL) 0.125 MG SL tablet Place 1 tablet (0.125 mg total) under the tongue every 4 (four) hours as needed. Patient not taking: Reported on  08/14/2014 07/20/14   Birdie Sons, MD   No Known Allergies   FAMILY HISTORY   Family History  Problem Relation Age of Onset  . CVA Mother   . Heart attack Father       SOCIAL HISTORY    reports that he quit smoking about 60 years ago. He has never used smokeless tobacco. He reports that he does not drink alcohol or use illicit drugs.  Review of Systems  Unable to perform ROS: critical illness  - unable to obtain - patient intubated and sedated    VITAL SIGNS    Temp:  [97 F (36.1 C)-99.3 F (37.4 C)] 98.2 F (36.8 C) (08/18 0730) Pulse Rate:  [78-132] 100 (08/18 0730) Resp:  [14-26] 17 (08/18 0730) BP: (77-150)/(54-91) 89/66 mmHg (08/18 0730) SpO2:  [94 %-100 %] 100 % (08/18 0730) FiO2 (%):  [80 %-100 %] 80 % (08/18 0727) Weight:  [179 lb 0.2 oz (81.2 kg)] 179 lb 0.2 oz (81.2 kg) (08/18 0408) HEMODYNAMICS:   VENTILATOR SETTINGS: Vent Mode:  [-] PRVC FiO2 (%):  [80 %-100 %] 80 % Set Rate:  [20 bmp-24 bmp] 24 bmp Vt Set:  [530 mL] 530 mL PEEP:  [12 cmH20] 12 cmH20 INTAKE / OUTPUT:  Intake/Output Summary (Last 24 hours) at 08-27-2014 0838 Last data filed at 2014/08/27 0600  Gross per 24 hour  Intake 3333.17 ml  Output   1354 ml  Net 1979.17 ml       PHYSICAL EXAM   Physical Exam GENERAL:sedated on the MV, no acute on the vent.  HEAD: Normocephalic, atraumatic.  EYES: Pupils equal, round, reactive to light. Unable to assess extraocular muscles given mental status/medical condition. No scleral icterus.  MOUTH: Moist mucosal membrane. NECK: Supple. No thyromegaly. No nodules. No JVD.  PULMONARY: Coarse breath sounds with scattered rhonchi right lung field, intubated/mechanical ventilation CARDIOVASCULAR: S1 and S2. Tachycardic No murmurs, rubs, or gallops. No edema. Pedal pulses 2+ bilaterally.  GASTROINTESTINAL: Soft, nontender, nondistended. No masses. Positive bowel sounds. No hepatosplenomegaly.  MUSCULOSKELETAL: No swelling, clubbing, or  edema.  NEUROLOGIC:  GCs<8T SKIN: No ulceration, lesions, rashes, or cyanosis. Skin warm and dry.     LABS   LABS:  CBC  Recent Labs Lab 08/23/14 0515 08/23/14 1847 08/24/14 0444 2014/08/27 0505  WBC 32.6*  --  49.0* 59.1*  HGB 8.7* 7.7* 7.6* 7.4*  HCT 28.0*  --  24.4* 24.4*  PLT 231  --  240 264   Coag's  Recent Labs Lab 08/20/14 0137 08/21/14 1154  08/23/14 2354 08/24/14 1434 2014-08-27 0242  APTT 36 51*  < > 37* 23* 33  INR 1.20 1.93  --   --   --   --   < > = values in this interval not displayed. BMET  Recent  Labs Lab 08/24/14 2011 2014-09-10 0018 10-Sep-2014 0242  NA 134* 137 137  K 5.3* 5.1 5.4*  CL 102 105 107  CO2 22 21* 21*  BUN 24* 25* 26*  CREATININE 1.18 1.23 1.21  GLUCOSE 257* 154* 144*   Electrolytes  Recent Labs Lab 08/24/14 2011 2014-09-10 0018 09/10/2014 0242  CALCIUM 7.2* 7.2* 6.7*  MG 1.9 2.0 1.8  PHOS 3.3 3.5 3.7   Sepsis Markers  Recent Labs Lab 08/20/14 0003 08/20/14 1534 08/21/14 0028  LATICACIDVEN 2.2* 1.5 1.6   ABG  Recent Labs Lab 08/20/14 1917 08/21/14 0505 08/24/14 1515  PHART 7.23* 7.30* 7.28*  PCO2ART 45 37 54*  PO2ART 63* 126* 79*   Liver Enzymes  Recent Labs Lab 08/22/2014 2025 08/20/14 0137 08/20/14 1636  08/24/14 2011 09-10-2014 0018 09/10/14 0242  AST 30 38 60*  --   --   --   --   ALT 14* 14* 17  --   --   --   --   ALKPHOS 118 99 97  --   --   --   --   BILITOT 0.4 <0.1* 0.4  --   --   --   --   ALBUMIN 3.0* 2.6* 2.7*  < > 1.8* 1.8* 1.3*  < > = values in this interval not displayed. Cardiac Enzymes  Recent Labs Lab 08/20/14 0003 08/20/14 0510 08/20/14 1037  TROPONINI 1.03* 4.15* 6.44*   Glucose  Recent Labs Lab 09-10-14 0315 Sep 10, 2014 0400 2014/09/10 0511 September 10, 2014 0605 Sep 10, 2014 0644 09/10/14 0810  GLUCAP 108* 116* 159* 165* 144* 164*     Recent Results (from the past 240 hour(s))  MRSA PCR Screening     Status: None   Collection Time: 08/17/2014  6:02 PM  Result Value Ref Range  Status   MRSA by PCR NEGATIVE NEGATIVE Final    Comment:        The GeneXpert MRSA Assay (FDA approved for NASAL specimens only), is one component of a comprehensive MRSA colonization surveillance program. It is not intended to diagnose MRSA infection nor to guide or monitor treatment for MRSA infections.   Blood Culture (routine x 2)     Status: None   Collection Time: 09/06/2014  7:15 PM  Result Value Ref Range Status   Specimen Description BLOOD RIGHT ARM  Final   Special Requests BOTTLES DRAWN AEROBIC AND ANAEROBIC 4CC  Final   Culture NO GROWTH 5 DAYS  Final   Report Status 08/24/2014 FINAL  Final  Blood Culture (routine x 2)     Status: None   Collection Time: 08/13/2014  7:25 PM  Result Value Ref Range Status   Specimen Description BLOOD LEFT ASSIST CONTROL  Final   Special Requests BOTTLES DRAWN AEROBIC AND ANAEROBIC 4CC  Final   Culture NO GROWTH 5 DAYS  Final   Report Status 08/24/2014 FINAL  Final  Urine culture     Status: None   Collection Time: 08/21/2014  8:25 PM  Result Value Ref Range Status   Specimen Description URINE, RANDOM  Final   Special Requests NONE  Final   Culture   Final    60,000 COLONIES/ml ESCHERICHIA COLI Results Called to: Janett Billow GREGORY AT 8003 08/22/14 [facility] ESBL-EXTENDED SPECTRUM BETA LACTAMASE-THE ORGANISM IS RESISTANT TO PENICILLINS, CEPHALOSPORINS AND AZTREONAM ACCORDING TO CLSI M100-S15 VOL.[place].    Report Status 08/22/2014 FINAL  Final   Organism ID, Bacteria ESCHERICHIA COLI  Final      Susceptibility  Escherichia coli - MIC*    AMPICILLIN >=32 RESISTANT Resistant     CEFTAZIDIME 4 RESISTANT Resistant     CEFAZOLIN >=64 RESISTANT Resistant     CEFTRIAXONE >=64 RESISTANT Resistant     CIPROFLOXACIN >=4 RESISTANT Resistant     GENTAMICIN <=1 SENSITIVE Sensitive     IMIPENEM <=0.25 SENSITIVE Sensitive     TRIMETH/SULFA <=20 SENSITIVE Sensitive     Extended ESBL POSITIVE Resistant     NITROFURANTOIN Value in next row  Sensitive      SENSITIVE<=16    PIP/TAZO Value in next row Sensitive      SENSITIVE<=4    AMPICILLIN/SULBACTAM Value in next row Sensitive      SENSITIVE8    * 60,000 COLONIES/ml ESCHERICHIA COLI  Culture, expectorated sputum-assessment     Status: None   Collection Time: 08/20/14 10:05 AM  Result Value Ref Range Status   Specimen Description EXPECTORATED SPUTUM  Final   Special Requests Normal  Final   Sputum evaluation THIS SPECIMEN IS ACCEPTABLE FOR SPUTUM CULTURE  Final   Report Status 08/20/2014 FINAL  Final  Culture, respiratory (NON-Expectorated)     Status: None   Collection Time: 08/20/14 10:05 AM  Result Value Ref Range Status   Specimen Description EXPECTORATED SPUTUM  Final   Special Requests Normal Reflexed from [id number]  Final   Gram Stain   Final    MANY WBC SEEN NO ORGANISMS SEEN EXCELLENT SPECIMEN - 90-100% WBCS    Culture   Final    LIGHT GROWTH ESCHERICHIA COLI ESBL-EXTENDED SPECTRUM BETA LACTAMASE-THE ORGANISM IS RESISTANT TO PENICILLINS, CEPHALOSPORINS AND AZTREONAM ACCORDING TO CLSI M100-S15 VOL.[place]. ESBL ECOLI ALREADY REPORTED IN URINE CULTURE 08/22/14 [facility]    Report Status 08/23/2014 FINAL  Final   Organism ID, Bacteria ESCHERICHIA COLI  Final      Susceptibility   Escherichia coli - MIC*    AMPICILLIN >=32 RESISTANT Resistant     CEFAZOLIN >=64 RESISTANT Resistant     CEFTRIAXONE >=64 RESISTANT Resistant     CIPROFLOXACIN >=4 RESISTANT Resistant     GENTAMICIN <=1 SENSITIVE Sensitive     IMIPENEM <=0.25 SENSITIVE Sensitive     NITROFURANTOIN <=16 SENSITIVE Sensitive     TRIMETH/SULFA <=20 SENSITIVE Sensitive     Extended ESBL POSITIVE Resistant     PIP/TAZO Value in next row Resistant      RESISTANT>=64    * LIGHT GROWTH ESCHERICHIA COLI     Current facility-administered medications:  .  acetaminophen (TYLENOL) tablet 650 mg, 650 mg, Oral, Q6H PRN, 650 mg at 08/22/14 0436 **OR** acetaminophen (TYLENOL) suppository 650 mg, 650 mg,  Rectal, Q6H PRN, Lytle Butte, MD .  Margrett Rud amiodarone (NEXTERONE) 1.8 mg/mL load via infusion 150 mg, 150 mg, Intravenous, Once, 150 mg at 08/23/14 1745 **FOLLOWED BY** [EXPIRED] amiodarone (NEXTERONE PREMIX) 360 MG/200ML (1.8 mg/mL) IV infusion, 60 mg/hr, Intravenous, Continuous, Last Rate: 33.3 mL/hr at 08/24/14 1428, 60 mg/hr at 08/24/14 1428 **FOLLOWED BY** amiodarone (NEXTERONE PREMIX) 360 MG/200ML (1.8 mg/mL) IV infusion, 30 mg/hr, Intravenous, Continuous, Isaias Cowman, MD, Last Rate: 16.6 mL/hr at 08/24/14 2200, 29.88 mg/hr at 08/24/14 2200 .  antiseptic oral rinse solution (CORINZ), 7 mL, Mouth Rinse, QID, Theodoro Grist, MD, 7 mL at 09-21-14 0400 .  aspirin chewable tablet 81 mg, 81 mg, Oral, Daily, Charlett Nose, RPH, 81 mg at 09-21-14 0809 .  atorvastatin (LIPITOR) tablet 20 mg, 20 mg, Oral, QHS, Lytle Butte, MD, 20 mg at 08/24/14 2221 .  budesonide (PULMICORT) nebulizer solution 0.5 mg, 0.5 mg, Nebulization, BID, Lytle Butte, MD, 0.5 mg at 2014-09-19 0726 .  chlorhexidine gluconate (PERIDEX) 0.12 % solution 15 mL, 15 mL, Mouth Rinse, BID, Theodoro Grist, MD, 15 mL at 08/24/14 2200 .  feeding supplement (VITAL AF 1.2 CAL) liquid 1,000 mL, 1,000 mL, Per Tube, Continuous, Flora Lipps, MD, Last Rate: 57 mL/hr at 2014/09/19 0032, 1,000 mL at 09-19-2014 0032 .  fentaNYL (SUBLIMAZE) injection 50 mcg, 50 mcg, Intravenous, Q2H PRN, Vilinda Boehringer, MD, 50 mcg at 08/24/14 0743 .  fentaNYL 2583mcg in NS 262mL (55mcg/ml) infusion-PREMIX, 10 mcg/hr, Intravenous, Continuous, Theodoro Grist, MD, Last Rate: 1 mL/hr at 19-Sep-2014 0107, 10 mcg/hr at September 19, 2014 0107 .  folic acid (FOLVITE) tablet 1 mg, 1 mg, Oral, Daily, Flora Lipps, MD, 1 mg at 08/24/14 0940 .  free water 25 mL, 25 mL, Per Tube, 6 times per day, Vishal Mungal, MD, 25 mL at 09-19-14 0400 .  heparin ADULT infusion 100 units/mL (25000 units/250 mL), 800 Units/hr, Intravenous, Continuous, Melissa D Maccia, RPH, Last Rate: 8 mL/hr at  08/24/14 2200, 800 Units/hr at 08/24/14 2200 .  heparin injection 1,000-6,000 Units, 1,000-6,000 Units, CRRT, PRN, Munsoor Lateef, MD .  imipenem-cilastatin (PRIMAXIN) 500 mg in sodium chloride 0.9 % 100 mL IVPB, 500 mg, Intravenous, 3 times per day, Charlett Nose, RPH, 500 mg at 2014-09-19 8119 .  insulin regular (NOVOLIN R,HUMULIN R) 250 Units in sodium chloride 0.9 % 250 mL (1 Units/mL) infusion, , Intravenous, Continuous, Theodoro Grist, MD, Last Rate: 4 mL/hr at 08/24/14 1527, 4 Units/hr at 08/24/14 1527 .  ipratropium-albuterol (DUONEB) 0.5-2.5 (3) MG/3ML nebulizer solution 3 mL, 3 mL, Nebulization, Q4H, Lytle Butte, MD, 3 mL at 09-19-2014 0726 .  methylPREDNISolone sodium succinate (SOLU-MEDROL) 40 mg/mL injection 20 mg, 20 mg, Intravenous, Daily, Flora Lipps, MD, 20 mg at 08/24/14 0940 .  metoCLOPramide (REGLAN) injection 5 mg, 5 mg, Intravenous, 3 times per day, Theodoro Grist, MD, 5 mg at 09-19-14 0528 .  metoprolol tartrate (LOPRESSOR) tablet 25 mg, 25 mg, Oral, TID, Teodoro Spray, MD, 25 mg at 08/24/14 2042 .  midazolam (VERSED) injection 2-4 mg, 2-4 mg, Intravenous, Q1H PRN, Flora Lipps, MD, 4 mg at 2014-09-19 0808 .  norepinephrine (LEVOPHED) 16 mg in dextrose 5 % 250 mL (0.064 mg/mL) infusion, 0-40 mcg/min, Intravenous, Titrated, Theodoro Grist, MD, Last Rate: 17.8 mL/hr at 09-19-14 0529, 19 mcg/min at Sep 19, 2014 0529 .  ondansetron (ZOFRAN) tablet 4 mg, 4 mg, Oral, Q6H PRN **OR** ondansetron (ZOFRAN) injection 4 mg, 4 mg, Intravenous, Q6H PRN, Lytle Butte, MD .  pureflow IV solution for Dialysis, , CRRT, Continuous, Munsoor Lateef, MD, Last Rate: 2,500 mL/hr at September 19, 2014 0520 .  senna-docusate (Senokot-S) tablet 2 tablet, 2 tablet, Oral, TID, Charlett Nose, [facility], 2 tablet at 08/24/14 2221 .  sennosides (SENOKOT) 8.8 MG/5ML syrup 5 mL, 5 mL, Per Tube, QHS PRN, Vishal Mungal, MD .  sodium chloride 0.9 % injection 3 mL, 3 mL, Intravenous, Q12H, Lytle Butte, MD, 10 mL at 08/24/14 2221 .   vecuronium (NORCURON) injection 10 mg, 10 mg, Intravenous, Q1H PRN, Flora Lipps, MD, 10 mg at 08/24/14 1249  IMAGING    No results found.   A\P  81 yo male admitted for sepsis, respiratory failure, intubated, past medical history of prostate cancer currently receiving chemotherapy Patient with acute pneumonia with COPD exacerbation with CHF exacerbation with ESBL UTI  PULMONARY  A: Acute respiratory failure-COPD exacerbation Pulmonary edema Respiratory  failure requiring mechanical ventilation Ventilator dyssynchrony P:  Continue with mechanical ventilation, wean as tolerated, not ready for vent weaning today Sedation/analgesia-as needed Follow-up respiratory cultures - light chain GNR E.coli -on primaxin -started steroids Increase PEEP to 12 and decrease fio2 as tolerared  CARDIOVASCULAR CVL - Right PICC ([facility]) 8/13 RT femoral Vasc Cath 8/16 A:  Elevated troponin Non-STEMI P:  Continue with heparin drip Follow Cardiology consultation ECHO 8/13 - EF 30-35% -on amiodarone infusion for afib  RENAL A:  Electrolyte abnormalities - hypokalemia, hypocalcemia  P:  Worsening renal function, on CRRT Follow up with Nephrology  GASTROINTESTINAL P:  Continue with PPI for stress ulcer prophylaxis Dietary consult Continue tube feeds  HEMATOLOGIC A:  Leukocytosis Sepsis P:  Elevated white blood count possibly due to sepsis versus leukemoid reaction Continue to monitor CBC Continue with current antibiotics   INFECTIOUS A: Sepsis P:  BCx2 8/12>>NTD UC 8/12>>ESBL Sputum 8/12>>light chain GNR-E coli Abx: Primaxin for 10-14 days   NEUROLOGIC P: Intubated and sedated RASS goal: -1 to -2     I have personally obtained a history, examined the patient, evaluated Pertinent laboratory and RadioGraphic/imaging results, and  formulated the assessment and plan   The Patient requires high complexity decision making for assessment and  support, frequent evaluation and titration of therapies, application of advanced monitoring technologies and extensive interpretation of multiple databases. Critical Care Time devoted to patient care services described in this note is 45  minutes.   Overall, patient is critically ill, prognosis is guarded.  Patient with Multiorgan failure and at high risk for cardiac arrest and death. Prognosis is very poor  Will obtain Palliative care consult  Corrin Parker, M.D.  [facility] Pulmonary & Critical Care Medicine  Medical Director [place] Director [place] Department       2014/09/23, 8:38 AM

## 2014-09-08 NOTE — Care Management Important Message (Deleted)
Important Message  Patient Details  Name: Marcus Blevins MRN: [id number] Date of Birth: Jan 22, 1927   Medicare Important Message Given:  Yes-third notification given    Marshell Garfinkel, RN 09/07/14, 11:26 AM

## 2014-09-08 NOTE — Progress Notes (Signed)
Critical lab value of WBC count of 59.1 was called.  Dr. Reece Levy, prime doc, was paged and made aware.  Per Dr. Reece Levy, follow up with morning docs.

## 2014-09-08 NOTE — Progress Notes (Signed)
[facility]  ROUNDING NOTE   Subjective:  Pt remains critically ill. White count up to 59k. Continues on CRRT. Remains oliguric at this time.   Objective:  Vital signs in last 24 hours:  Temp:  [97 F (36.1 C)-99.3 F (37.4 C)] 98.4 F (36.9 C) (08/18 0800) Pulse Rate:  [78-132] 97 (08/18 0800) Resp:  [14-26] 15 (08/18 0800) BP: (77-150)/(54-91) 88/54 mmHg (08/18 0800) SpO2:  [94 %-100 %] 100 % (08/18 0800) FiO2 (%):  [80 %-100 %] 80 % (08/18 0727) Weight:  [81.2 kg (179 lb 0.2 oz)] 81.2 kg (179 lb 0.2 oz) (08/18 0408)  Weight change: 2 kg (4 lb 6.6 oz) Filed Weights   08/23/14 1245 08/24/14 0500 09/22/14 0408  Weight: 79.2 kg (174 lb 9.7 oz) 82.6 kg (182 lb 1.6 oz) 81.2 kg (179 lb 0.2 oz)    Intake/Output: I/O last 3 completed shifts: In: 3395.3 [I.V.:237.3; NG/GT:2658; IV ZOXWRUEAV:409] Out: 8119 [Urine:397; JYNWG:9562]   Intake/Output this shift:     Physical Exam: General: Critically ill appearing  Head: Normocephalic, atraumatic. ETT in place  Eyes: Eyes closed  Neck: Supple, trachea midline  Lungs:  Bilateral rhonchi, vent assisted, 60% fio2.  Heart: S1S2 no rubs  Abdomen:  Soft, nontender, mild distension  Extremities:  1+ generalized edema  Neurologic: Intubated, sedated  Skin: No lesions  Access: Right fermoral temp cath    Basic Metabolic Panel:  Recent Labs Lab 08/24/14 1209 08/24/14 1720 08/24/14 2011 2014-09-22 0018 22-Sep-2014 0242 2014/09/22 0825  NA 131* 129* 134* 137 137 137  K 4.7 5.0 5.3* 5.1 5.4* 4.1  CL 101 98* 102 105 107 105  CO2 24 22 22  21* 21* 20*  GLUCOSE 187* 365* 257* 154* 144* 176*  BUN 27* 25* 24* 25* 26* 26*  CREATININE 1.21 1.19 1.18 1.23 1.21 1.17  CALCIUM 6.8* 6.8* 7.2* 7.2* 6.7* 7.0*  MG 1.8 1.8 1.9 2.0 1.8  --   PHOS 3.1 3.1 3.3 3.5 3.7 3.1    Liver Function Tests:  Recent Labs Lab 08/26/2014 2025 08/20/14 0137 08/20/14 1636  08/24/14 1720 08/24/14 2011 2014/09/22 0018 2014/09/22 0242  22-Sep-2014 0825  AST 30 38 60*  --   --   --   --   --   --   ALT 14* 14* 17  --   --   --   --   --   --   ALKPHOS 118 99 97  --   --   --   --   --   --   BILITOT 0.4 <0.1* 0.4  --   --   --   --   --   --   PROT 5.8* 5.0* 5.4*  --   --   --   --   --   --   ALBUMIN 3.0* 2.6* 2.7*  < > 1.8* 1.8* 1.8* 1.3* 1.7*  < > = values in this interval not displayed. No results for input(s): LIPASE, AMYLASE in the last 168 hours. No results for input(s): AMMONIA in the last 168 hours.  CBC:  Recent Labs Lab 08/26/2014 2025  08/22/14 0426 08/22/14 1749 08/23/14 0515 08/23/14 1847 08/24/14 0444 09-22-14 0505  WBC 32.9*  < > 21.1* 25.0* 32.6*  --  49.0* 59.1*  NEUTROABS 32.6*  --   --   --   --   --   --   --   HGB 9.3*  < > 7.7* 7.6* 8.7* 7.7* 7.6* 7.4*  HCT 29.3*  < > 24.2* 25.0* 28.0*  --  24.4* 24.4*  MCV 83.2  < > 82.7 82.9 82.7  --  81.7 83.1  PLT 196  < > 129* 134* 231  --  240 264  < > = values in this interval not displayed.  Cardiac Enzymes:  Recent Labs Lab 08/22/2014 2025 08/20/14 0003 08/20/14 0510 08/20/14 1037  TROPONINI 0.08* 1.03* 4.15* 6.44*    BNP: Invalid input(s): POCBNP  CBG:  Recent Labs Lab 29-Aug-2014 0400 08/29/2014 0511 08/29/14 0605 Aug 29, 2014 0644 08/29/14 0810  GLUCAP 116* 159* 165* 144* 164*    Microbiology: Results for orders placed or performed during the hospital encounter of 08/20/2014  MRSA PCR Screening     Status: None   Collection Time: 08/31/2014  6:02 PM  Result Value Ref Range Status   MRSA by PCR NEGATIVE NEGATIVE Final    Comment:        The GeneXpert MRSA Assay (FDA approved for NASAL specimens only), is one component of a comprehensive MRSA colonization surveillance program. It is not intended to diagnose MRSA infection nor to guide or monitor treatment for MRSA infections.   Blood Culture (routine x 2)     Status: None   Collection Time: 08/30/2014  7:15 PM  Result Value Ref Range Status   Specimen Description BLOOD RIGHT ARM   Final   Special Requests BOTTLES DRAWN AEROBIC AND ANAEROBIC 4CC  Final   Culture NO GROWTH 5 DAYS  Final   Report Status 08/24/2014 FINAL  Final  Blood Culture (routine x 2)     Status: None   Collection Time: 08/27/2014  7:25 PM  Result Value Ref Range Status   Specimen Description BLOOD LEFT ASSIST CONTROL  Final   Special Requests BOTTLES DRAWN AEROBIC AND ANAEROBIC 4CC  Final   Culture NO GROWTH 5 DAYS  Final   Report Status 08/24/2014 FINAL  Final  Urine culture     Status: None   Collection Time: 09/03/2014  8:25 PM  Result Value Ref Range Status   Specimen Description URINE, RANDOM  Final   Special Requests NONE  Final   Culture   Final    60,000 COLONIES/ml ESCHERICHIA COLI Results Called to: Janett Billow GREGORY AT 1275 08/22/14 [facility] ESBL-EXTENDED SPECTRUM BETA LACTAMASE-THE ORGANISM IS RESISTANT TO PENICILLINS, CEPHALOSPORINS AND AZTREONAM ACCORDING TO CLSI M100-S15 VOL.[place].    Report Status 08/22/2014 FINAL  Final   Organism ID, Bacteria ESCHERICHIA COLI  Final      Susceptibility   Escherichia coli - MIC*    AMPICILLIN >=32 RESISTANT Resistant     CEFTAZIDIME 4 RESISTANT Resistant     CEFAZOLIN >=64 RESISTANT Resistant     CEFTRIAXONE >=64 RESISTANT Resistant     CIPROFLOXACIN >=4 RESISTANT Resistant     GENTAMICIN <=1 SENSITIVE Sensitive     IMIPENEM <=0.25 SENSITIVE Sensitive     TRIMETH/SULFA <=20 SENSITIVE Sensitive     Extended ESBL POSITIVE Resistant     NITROFURANTOIN Value in next row Sensitive      SENSITIVE<=16    PIP/TAZO Value in next row Sensitive      SENSITIVE<=4    AMPICILLIN/SULBACTAM Value in next row Sensitive      SENSITIVE8    * 60,000 COLONIES/ml ESCHERICHIA COLI  Culture, expectorated sputum-assessment     Status: None   Collection Time: 08/20/14 10:05 AM  Result Value Ref Range Status   Specimen Description EXPECTORATED SPUTUM  Final   Special Requests  Normal  Final   Sputum evaluation THIS SPECIMEN IS ACCEPTABLE FOR SPUTUM  CULTURE  Final   Report Status 08/20/2014 FINAL  Final  Culture, respiratory (NON-Expectorated)     Status: None   Collection Time: 08/20/14 10:05 AM  Result Value Ref Range Status   Specimen Description EXPECTORATED SPUTUM  Final   Special Requests Normal Reflexed from [id number]  Final   Gram Stain   Final    MANY WBC SEEN NO ORGANISMS SEEN EXCELLENT SPECIMEN - 90-100% WBCS    Culture   Final    LIGHT GROWTH ESCHERICHIA COLI ESBL-EXTENDED SPECTRUM BETA LACTAMASE-THE ORGANISM IS RESISTANT TO PENICILLINS, CEPHALOSPORINS AND AZTREONAM ACCORDING TO CLSI M100-S15 VOL.[place]. ESBL ECOLI ALREADY REPORTED IN URINE CULTURE 08/22/14 [facility]    Report Status 08/23/2014 FINAL  Final   Organism ID, Bacteria ESCHERICHIA COLI  Final      Susceptibility   Escherichia coli - MIC*    AMPICILLIN >=32 RESISTANT Resistant     CEFAZOLIN >=64 RESISTANT Resistant     CEFTRIAXONE >=64 RESISTANT Resistant     CIPROFLOXACIN >=4 RESISTANT Resistant     GENTAMICIN <=1 SENSITIVE Sensitive     IMIPENEM <=0.25 SENSITIVE Sensitive     NITROFURANTOIN <=16 SENSITIVE Sensitive     TRIMETH/SULFA <=20 SENSITIVE Sensitive     Extended ESBL POSITIVE Resistant     PIP/TAZO Value in next row Resistant      RESISTANT>=64    * LIGHT GROWTH ESCHERICHIA COLI    Coagulation Studies: No results for input(s): LABPROT, INR in the last 72 hours.  Urinalysis: No results for input(s): COLORURINE, LABSPEC, PHURINE, GLUCOSEU, HGBUR, BILIRUBINUR, KETONESUR, PROTEINUR, UROBILINOGEN, NITRITE, LEUKOCYTESUR in the last 72 hours.  Invalid input(s): APPERANCEUR    Imaging: US Renal  08/23/2014   CLINICAL DATA:  Acute renal failure.  EXAM: RENAL / URINARY TRACT ULTRASOUND COMPLETE  COMPARISON:  Abdominal radiograph dated 08/20/2014.  FINDINGS: Right Kidney:  Length: 10.3 cm. Echogenicity within normal limits. There are 2 hypoechoic circumscribed masses within the right renal pelvis, the larger of which measures 1.0 by 1.0 by 1.0  cm. No hydronephrosis visualized.  Left Kidney:  Length: 10.2 cm. Echogenicity within normal limits. No mass or hydronephrosis visualized.  Bladder:  Urinary bladder is collapsed around a Foley catheter, and therefore not well visualized.  IMPRESSION: No evidence of hydronephrosis.  Preserved cortical thickness bilaterally.  2 hypoechoic circumscribed masses within the right renal pelvis, which may represent parapelvic cysts. Short-term sonographic follow-up, or cross-sectional imaging follow-up may be considered if found clinically necessary and when clinically feasible.   Electronically Signed   By: Fidela Salisbury M.D.   On: 08/23/2014 13:15   Dg Abd Portable 1v  08/23/2014   CLINICAL DATA:  Orogastric tube placement  EXAM: PORTABLE ABDOMEN - 1 VIEW  COMPARISON:  August 20, 2014  FINDINGS: Orogastric tube tip and side port are in the stomach. Visualized bowel gas pattern unremarkable. There is atelectatic change in the left lung base. There are surgical clips in the upper right abdomen.  IMPRESSION: Tube tip and side port in stomach. Visualized bowel gas pattern unremarkable.   Electronically Signed   By: Lowella Grip III M.D.   On: 08/23/2014 12:19     Medications:   . amiodarone 29.88 mg/hr (08/24/14 2200)  . feeding supplement (VITAL AF 1.2 CAL) 1,000 mL (09-13-2014 0032)  . fentaNYL infusion INTRAVENOUS 150 mcg/hr (2014-09-13 0844)  . heparin 800 Units/hr (08/24/14 2200)  . insulin (NOVOLIN-R) infusion 4  Units/hr (08/24/14 1527)  . norepinephrine (LEVOPHED) Adult infusion 19 mcg/min (09-02-14 0529)  . pureflow 2,500 mL/hr at 09-02-14 0520  . vasopressin (PITRESSIN) infusion - *FOR SHOCK*     . antiseptic oral rinse  7 mL Mouth Rinse QID  . aspirin  81 mg Oral Daily  . atorvastatin  20 mg Oral QHS  . budesonide  0.5 mg Nebulization BID  . chlorhexidine gluconate  15 mL Mouth Rinse BID  . folic acid  1 mg Oral Daily  . free water  25 mL Per Tube 6 times per day  .  imipenem-cilastatin  500 mg Intravenous 3 times per day  . ipratropium-albuterol  3 mL Nebulization Q4H  . methylPREDNISolone (SOLU-MEDROL) injection  20 mg Intravenous Daily  . metoCLOPramide (REGLAN) injection  5 mg Intravenous 3 times per day  . metoprolol tartrate  25 mg Oral TID  . senna-docusate  2 tablet Oral TID  . sodium chloride  3 mL Intravenous Q12H   acetaminophen **OR** acetaminophen, fentaNYL (SUBLIMAZE) injection, heparin, midazolam, ondansetron **OR** ondansetron (ZOFRAN) IV, sennosides, vecuronium  Assessment/ Plan:  81 y.o. male with a PMHx of prostate cancer, coronary artery disease, hypertension, hyperlipidemia, history of bladder tumor resection, who was admitted to [facility] on 08/11/2014 for evaluation of fever, chills, and cough.  1.  Acute renal failure due to ATN from hypotension/sepsis/congestive heart failure. -UOP remains in the oliguric range, pt also remains critically ill.  Will continue CRRT at present with current parameters.   2. Acute respiratory failure. Pneumonia likely contributing to this.  -continue vent support, fio2 down to 60%, Dr. Mortimer Fries considering bronchscopy.   3. Septic shock. White count worsening, currently on imipenem-cilastatin.  4. Hyponatremia. Serum Na normalized on CRRT at 137, continue to monitor Na.  5. Metabolic acidosis. Concerning as bicarb is 20 while on CRRT, suspect some on going ischemia, ? Gut vs lung vs limb.  Overall very concerning sign to see acidosis while on CRRT.  6.  Overall prognosis very guarded.     LOS: 6 Harutyun Monteverde 2016-08-268:[id number] AM

## 2014-09-08 DEATH — deceased

## 2015-10-23 IMAGING — CR DG ABDOMEN 1V
1 series · 1 of 1 positions shown · non-contrast
Comparison: None.

CLINICAL DATA: Orogastric tube placement.  Initial encounter.

EXAM:
ABDOMEN - 1 VIEW

[ap]
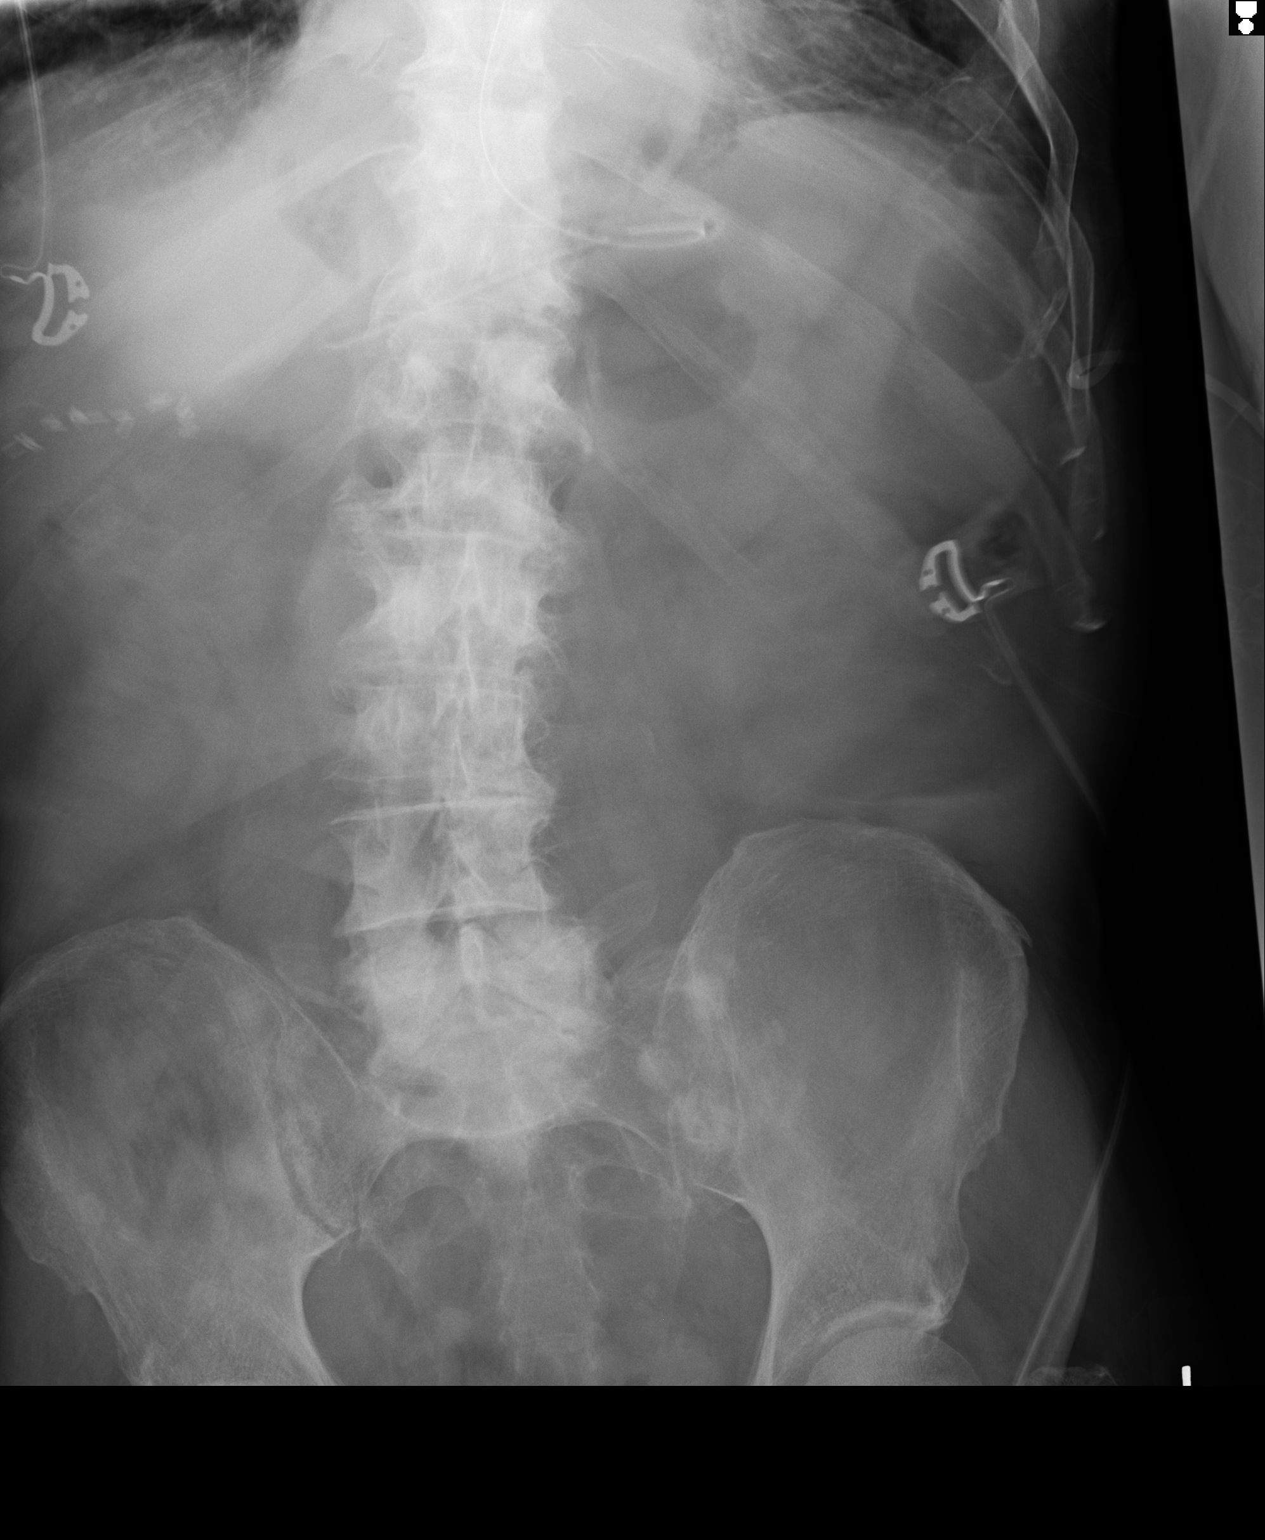

[1 of 1 positions shown; findings below may reference images not displayed]

FINDINGS: The patient's enteric tube is seen ending overlying the antrum of
the stomach.

The visualized bowel gas pattern is grossly unremarkable. No free
intra-abdominal air is seen, though evaluation for free air is
limited on a single supine view.

There is an unusual sclerotic appearance to the lumbar spine, with
suggestion of scattered sclerotic lesions within the sacrum and
ilium, concerning for metastatic disease.
IMPRESSION: 1. Enteric tube noted ending overlying the antrum of the stomach.
2. Visualized sclerotic lesions throughout the osseous structures
raises concern for metastatic disease from the patient's known
prostate primary.

## 2015-10-24 IMAGING — CR DG CHEST 1V PORT
1 series · 1 of 1 positions shown · non-contrast
Comparison: 08/20/2014

CLINICAL DATA: Acute respiratory failure, intubated

EXAM:
PORTABLE CHEST - 1 VIEW

[portable]
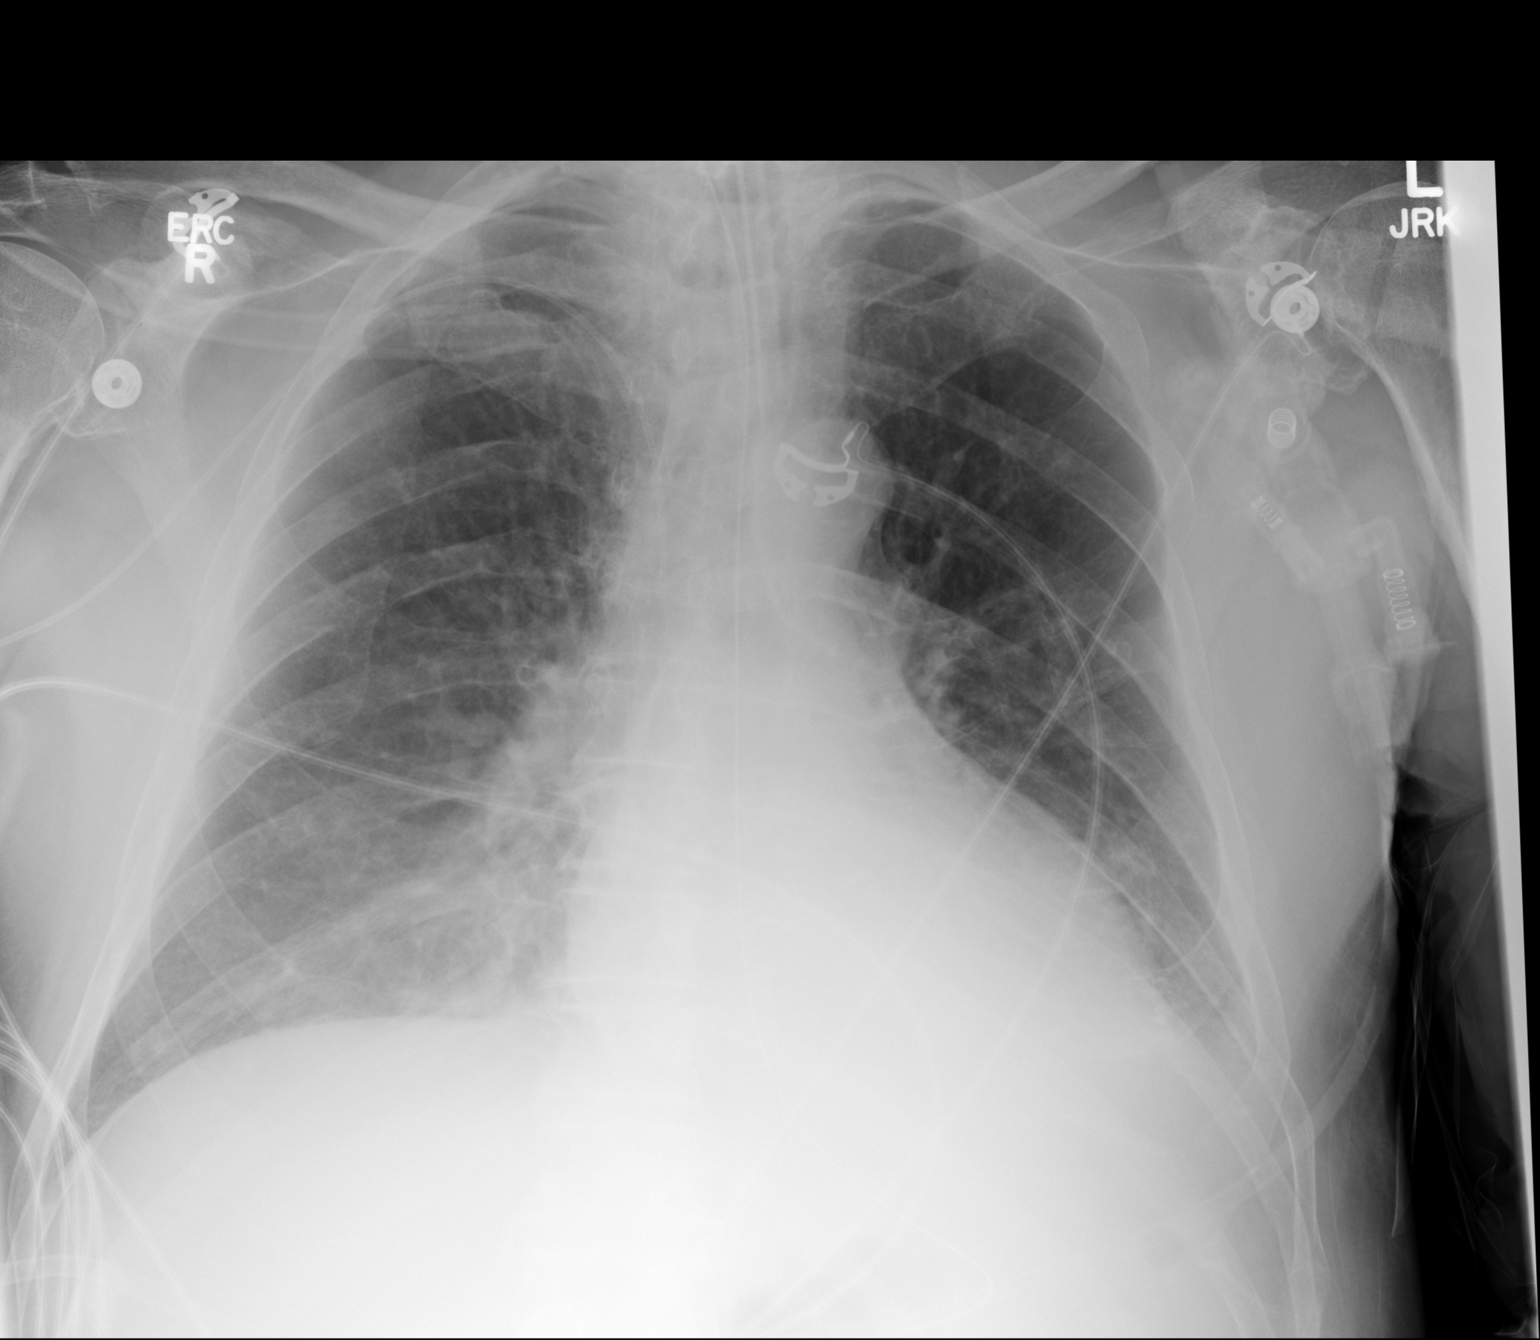

[1 of 1 positions shown; findings below may reference images not displayed]

FINDINGS: Cardiomediastinal silhouette is stable. Endotracheal tube and NG
tube are unchanged in position. Again noted right arm PICC line with
tip in SVC. There is hazy atelectasis or infiltrate right base
medially. Probable small left pleural effusion with left basilar
atelectasis or infiltrate. No convincing pulmonary edema.
Degenerative changes bilateral shoulders.
IMPRESSION: Endotracheal tube and NG tube are unchanged in position. Again noted
right arm PICC line with tip in SVC. There is hazy atelectasis or
infiltrate right base medially. Probable small left pleural effusion
with left basilar atelectasis or infiltrate. No convincing pulmonary
edema.

## 2015-10-26 IMAGING — CR DG ABD PORTABLE 1V
1 series · 1 of 1 positions shown · non-contrast
Comparison: August 20, 2014

CLINICAL DATA: Orogastric tube placement

EXAM:
PORTABLE ABDOMEN - 1 VIEW

[ap]
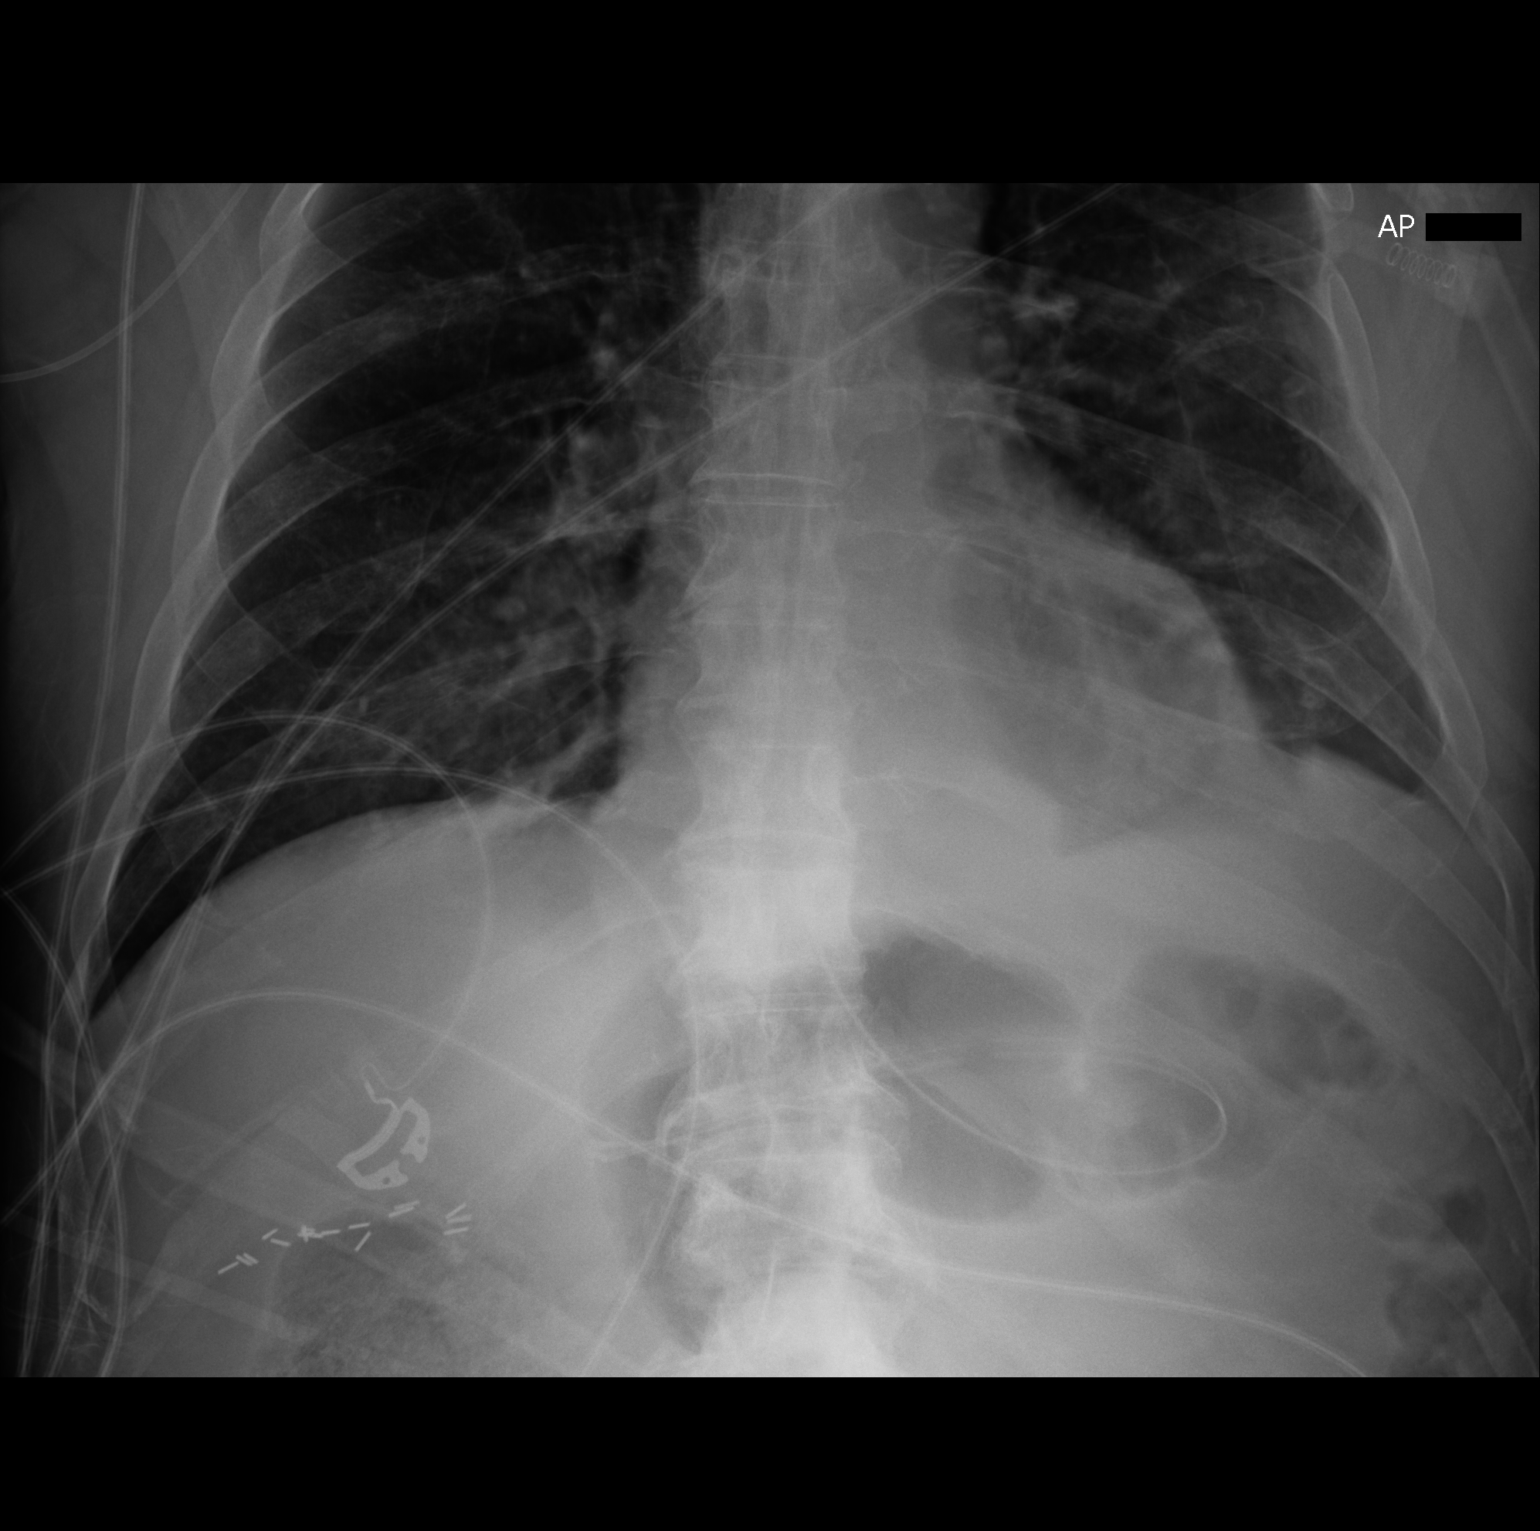

[1 of 1 positions shown; findings below may reference images not displayed]

FINDINGS: Orogastric tube tip and side port are in the stomach. Visualized
bowel gas pattern unremarkable. There is atelectatic change in the
left lung base. There are surgical clips in the upper right abdomen.
IMPRESSION: Tube tip and side port in stomach. Visualized bowel gas pattern
unremarkable.

## 2016-01-02 IMAGING — US US RENAL
1 series · 14 of 25 positions shown · non-contrast
Comparison: Abdominal radiograph dated 08/20/2014.

CLINICAL DATA: Acute renal failure.

EXAM:
RENAL / URINARY TRACT ULTRASOUND COMPLETE

[Series 1: us renal · 0.28mm/px · 14 of 39 slices shown]
[im 1/39]
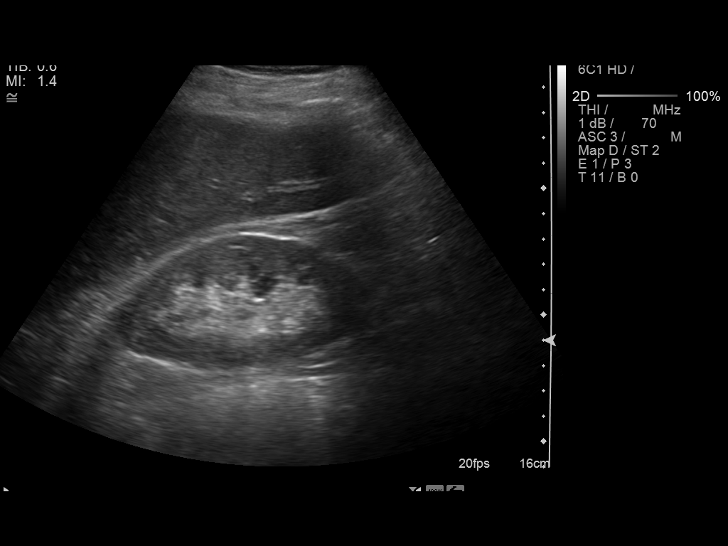
[im 4/39]
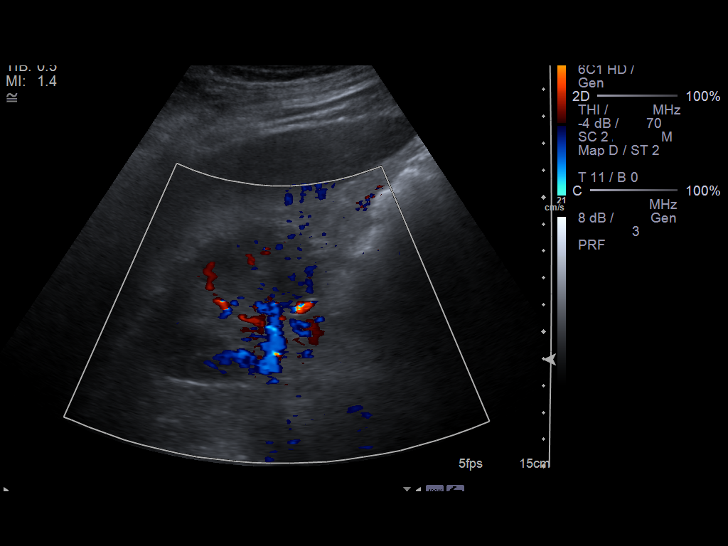
[im 7/39]
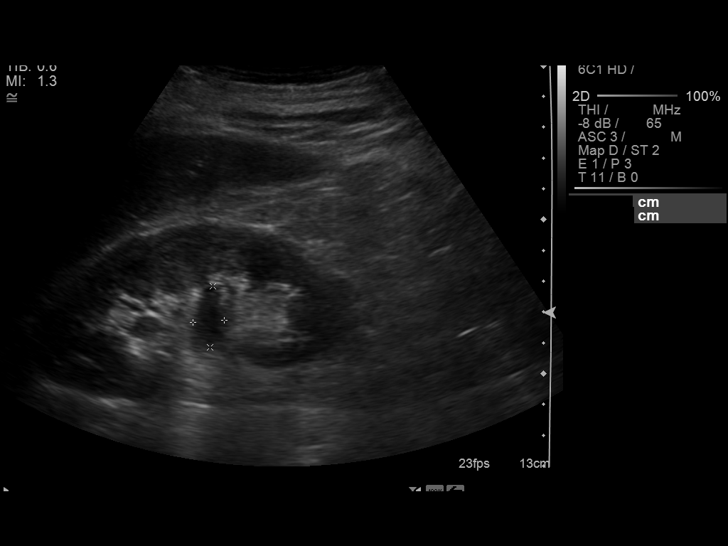
[im 10/39]
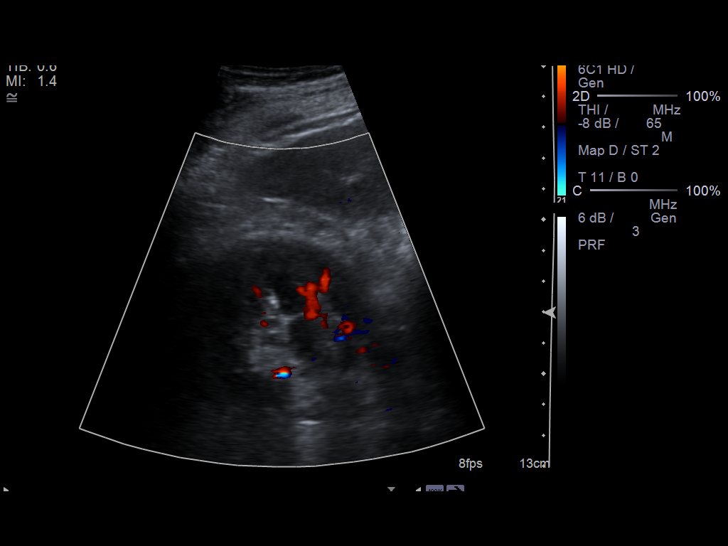
[im 13/39]
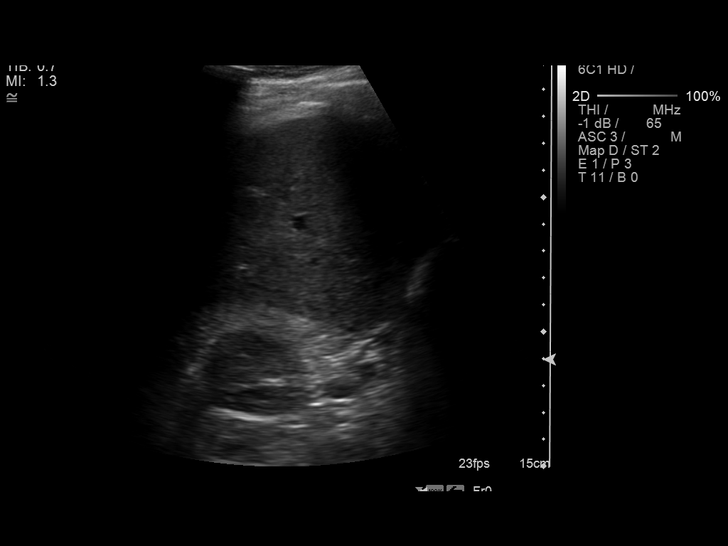
[im 15/39]
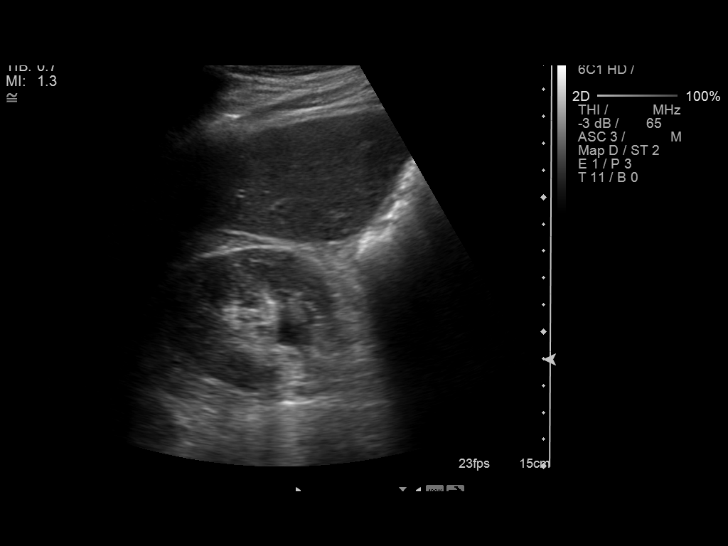
[im 18/39]
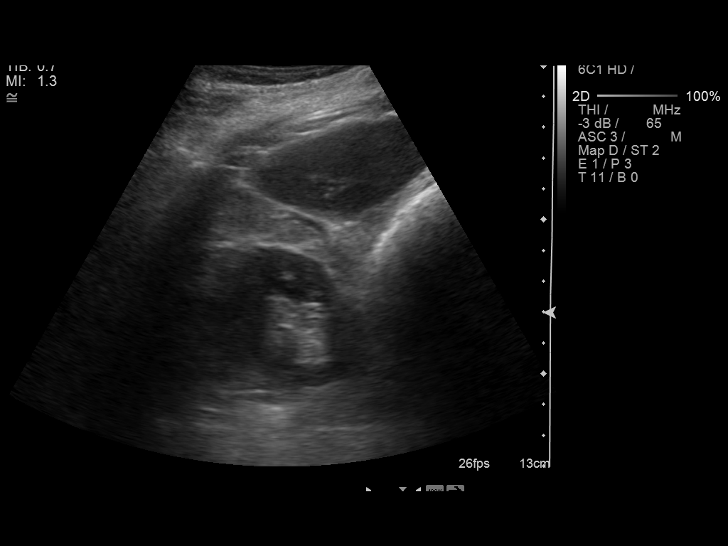
[im 21/39]
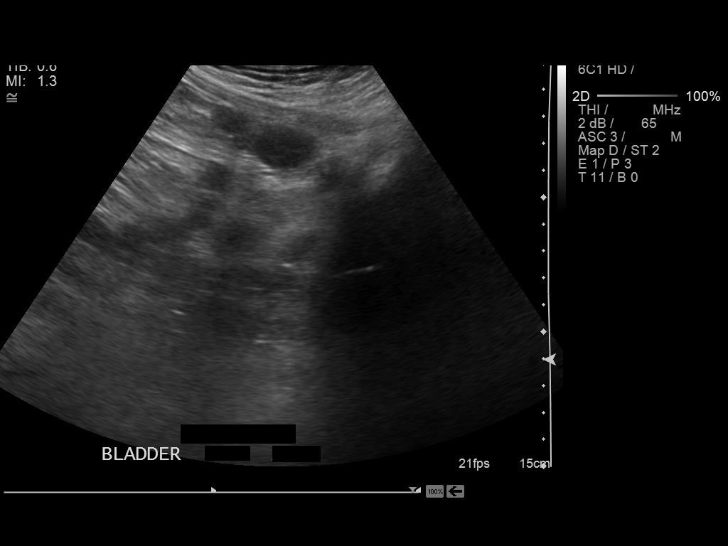
[im 24/39]
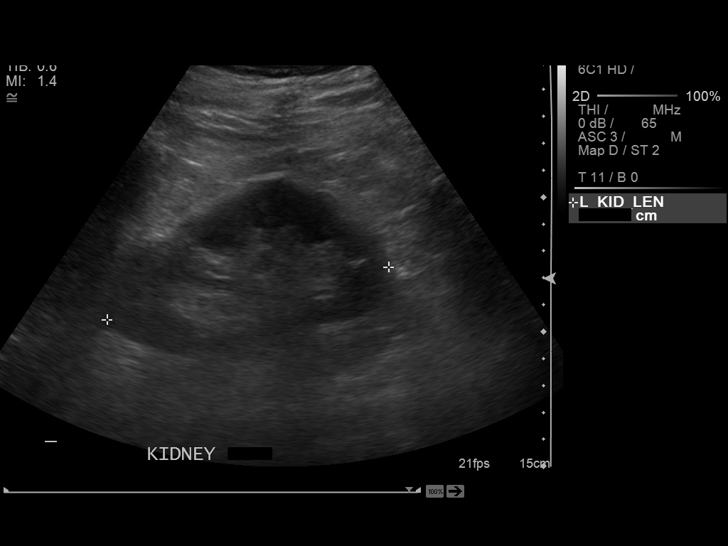
[im 26/39]
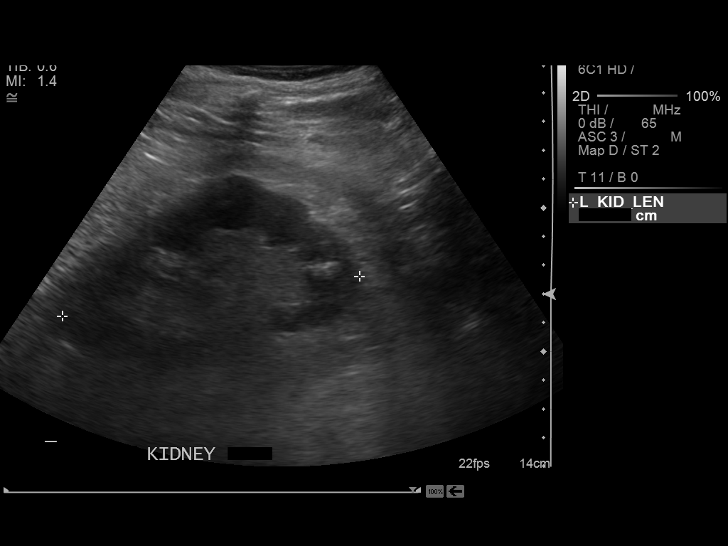
[im 29/39]
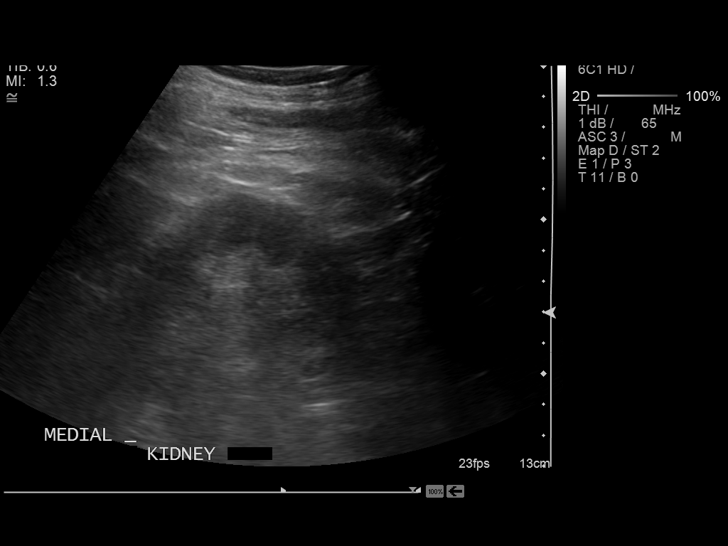
[im 32/39]
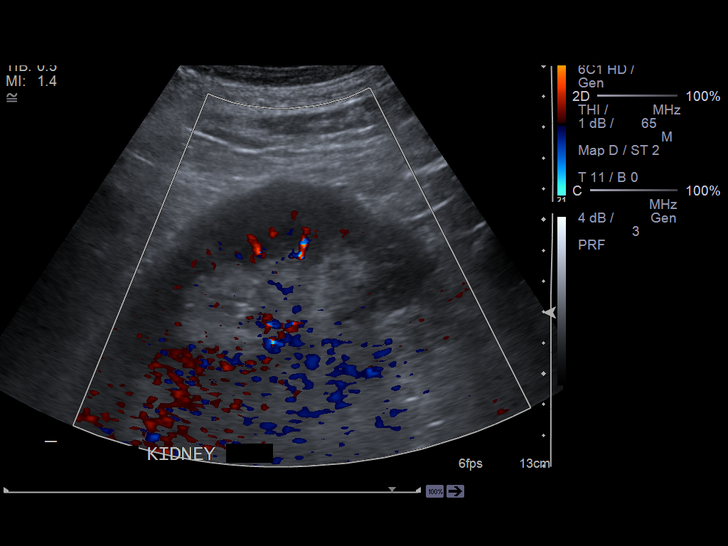
[im 35/39]
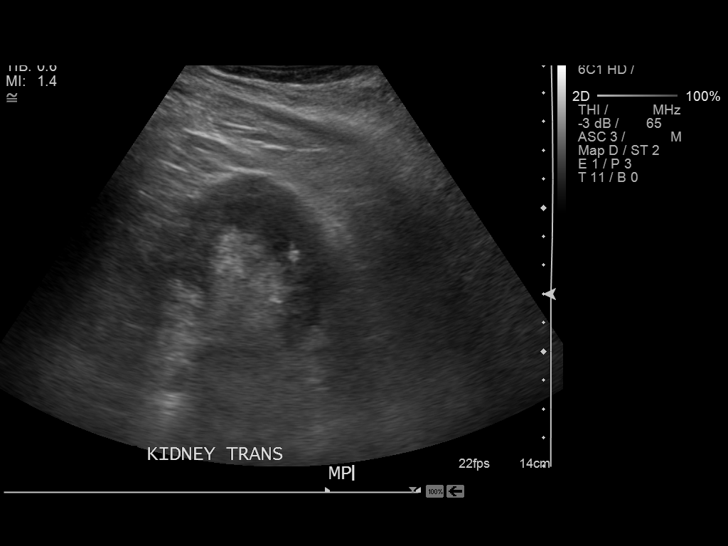
[im 39/39]
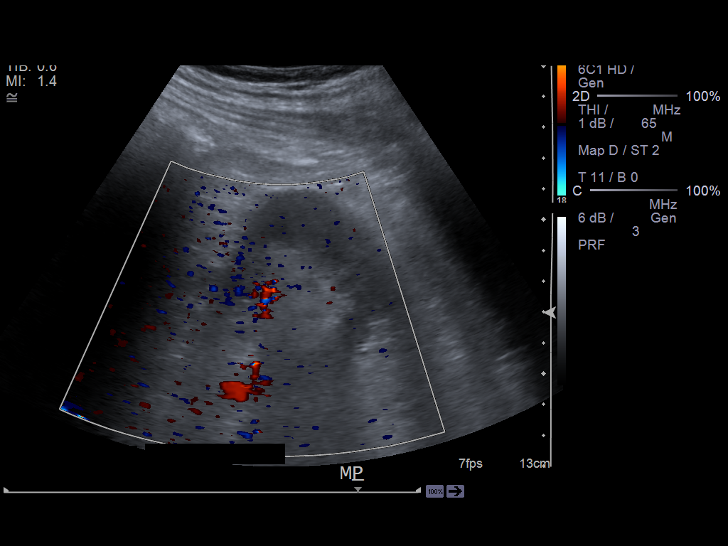

[14 of 25 positions shown; findings below may reference images not displayed]

FINDINGS: Right Kidney:

Length: 10.3 cm. Echogenicity within normal limits. There are 2
hypoechoic circumscribed masses within the right renal pelvis, the
larger of which measures 1.0 by 1.0 by 1.0 cm. No hydronephrosis
visualized.

Left Kidney:

Length: 10.2 cm. Echogenicity within normal limits. No mass or
hydronephrosis visualized.

Bladder:

Urinary bladder is collapsed around a Foley catheter, and therefore
not well visualized.
IMPRESSION: No evidence of hydronephrosis.

Preserved cortical thickness bilaterally.

2 hypoechoic circumscribed masses within the right renal pelvis,
which may represent parapelvic cysts. Short-term sonographic
follow-up, or cross-sectional imaging follow-up may be considered if
found clinically necessary and when clinically feasible.
